# Patient Record
Sex: Male | Born: 1935
Health system: Southern US, Community
[De-identification: ages and names within clinical notes are randomized; demographics above are authoritative.]

## PROBLEM LIST (undated history)

## (undated) DIAGNOSIS — G8929 Other chronic pain: Secondary | ICD-10-CM

## (undated) DIAGNOSIS — E539 Vitamin B deficiency, unspecified: Secondary | ICD-10-CM

## (undated) DIAGNOSIS — S3992XA Unspecified injury of lower back, initial encounter: Secondary | ICD-10-CM

## (undated) DIAGNOSIS — R972 Elevated prostate specific antigen [PSA]: Secondary | ICD-10-CM

## (undated) DIAGNOSIS — M545 Low back pain, unspecified: Secondary | ICD-10-CM

## (undated) DIAGNOSIS — I10 Essential (primary) hypertension: Secondary | ICD-10-CM

## (undated) DIAGNOSIS — R5383 Other fatigue: Secondary | ICD-10-CM

## (undated) DIAGNOSIS — R5381 Other malaise: Secondary | ICD-10-CM

## (undated) DIAGNOSIS — I2510 Atherosclerotic heart disease of native coronary artery without angina pectoris: Secondary | ICD-10-CM

## (undated) DIAGNOSIS — F329 Major depressive disorder, single episode, unspecified: Secondary | ICD-10-CM

## (undated) DIAGNOSIS — K589 Irritable bowel syndrome without diarrhea: Secondary | ICD-10-CM

## (undated) DIAGNOSIS — D510 Vitamin B12 deficiency anemia due to intrinsic factor deficiency: Secondary | ICD-10-CM

## (undated) DIAGNOSIS — E785 Hyperlipidemia, unspecified: Secondary | ICD-10-CM

## (undated) DIAGNOSIS — F419 Anxiety disorder, unspecified: Secondary | ICD-10-CM

## (undated) DIAGNOSIS — N529 Male erectile dysfunction, unspecified: Secondary | ICD-10-CM

## (undated) DIAGNOSIS — F32A Depression, unspecified: Secondary | ICD-10-CM

## (undated) HISTORY — DX: Hyperlipidemia, unspecified: E78.5

## (undated) HISTORY — DX: Low back pain, unspecified: M54.50

## (undated) HISTORY — DX: Irritable bowel syndrome, unspecified: K58.9

## (undated) HISTORY — DX: Male erectile dysfunction, unspecified: N52.9

## (undated) HISTORY — DX: Anxiety disorder, unspecified: F41.9

## (undated) HISTORY — DX: Vitamin B deficiency, unspecified: E53.9

## (undated) HISTORY — DX: Other malaise: R53.81

## (undated) HISTORY — DX: Depression, unspecified: F32.A

## (undated) HISTORY — DX: Other malaise: R53.83

## (undated) HISTORY — DX: Low back pain: M54.5

## (undated) HISTORY — PX: EYE SURGERY: SHX253

## (undated) HISTORY — DX: Essential (primary) hypertension: I10

## (undated) HISTORY — DX: Other chronic pain: G89.29

## (undated) HISTORY — PX: CARPAL TUNNEL RELEASE: SHX101

## (undated) HISTORY — DX: Unspecified injury of lower back, initial encounter: S39.92XA

## (undated) HISTORY — DX: Atherosclerotic heart disease of native coronary artery without angina pectoris: I25.10

## (undated) HISTORY — DX: Major depressive disorder, single episode, unspecified: F32.9

## (undated) HISTORY — DX: Vitamin B12 deficiency anemia due to intrinsic factor deficiency: D51.0

## (undated) HISTORY — DX: Elevated prostate specific antigen (PSA): R97.20

## (undated) HISTORY — PX: VASECTOMY: SHX75

---

## 2004-06-03 LAB — HM COLONOSCOPY: HM Colonoscopy: NORMAL

## 2010-01-01 ENCOUNTER — Encounter: Payer: Self-pay | Admitting: Cardiovascular Disease

## 2010-01-02 ENCOUNTER — Encounter: Payer: Self-pay | Admitting: Cardiovascular Disease

## 2010-01-03 ENCOUNTER — Encounter: Payer: Self-pay | Admitting: Cardiovascular Disease

## 2010-01-08 ENCOUNTER — Encounter: Payer: Self-pay | Admitting: Cardiovascular Disease

## 2010-01-09 ENCOUNTER — Ambulatory Visit: Payer: Self-pay | Admitting: Cardiovascular Disease

## 2010-01-09 DIAGNOSIS — I2510 Atherosclerotic heart disease of native coronary artery without angina pectoris: Secondary | ICD-10-CM | POA: Insufficient documentation

## 2010-01-09 DIAGNOSIS — I10 Essential (primary) hypertension: Secondary | ICD-10-CM

## 2010-01-17 ENCOUNTER — Telehealth: Payer: Self-pay | Admitting: Cardiovascular Disease
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
## 2010-06-18 NOTE — Op Note (Signed)
Summary: [facility]  [facility]   Imported By: Harlon Flor 01/17/2010 15:28:00  _____________________________________________________________________  External Attachment:    Type:   Image     Comment:   External Document

## 2010-06-18 NOTE — Progress Notes (Signed)
Summary: RX  Phone Note Call from Patient Call back at [phone]   Caller: self Call For: gollan Summary of Call: PT IS CONFUSED BECAUSE 2 PRESCRIPTIONS WERE CALLED IN AND HAS QUESTIONS ABOUT THE LISINOPRIL BEING CALLED IN Initial call taken by: Harlon Flor,  January 17, 2010 2:08 PM  Follow-up for Phone Call        [facility] and confirmed that the RX for Lisinopril was still there on file-they stated that the RX was ready for pick up even though the pt refused it the first time-pt informed Follow-up by: Harlon Flor,  January 17, 2010 3:51 PM  Additional Follow-up for Phone Call Additional follow up Details #1::        attempted to call pt LMOM TCB Benedict Needy, RN  January 17, 2010 4:43 PM   attempted to pt LMOM TCB. Additional Follow-up by: Bishop Dublin, CMA,  January 22, 2010 9:22 AM    Additional Follow-up for Phone Call Additional follow up Details #2::    LMOM to call the office regarding lisinopril.   Follow-up by: Bishop Dublin, CMA,  January 23, 2010 12:57 PM   Appended Document: RX Patient called to confirm medication dose and looks like eveything is good now.

## 2010-06-18 NOTE — Letter (Signed)
Summary: [facility]  [facility]   Imported By: Harlon Flor 01/09/2010 16:28:19  _____________________________________________________________________  External Attachment:    Type:   Image     Comment:   External Document

## 2010-06-18 NOTE — Letter (Signed)
Summary: PHI  PHI   Imported By: Harlon Flor 01/17/2010 15:28:18  _____________________________________________________________________  External Attachment:    Type:   Image     Comment:   External Document

## 2010-12-10 ENCOUNTER — Encounter: Payer: Self-pay | Admitting: Cardiovascular Disease

## 2010-12-17 ENCOUNTER — Encounter: Payer: Self-pay | Admitting: Cardiovascular Disease

## 2010-12-24 ENCOUNTER — Encounter: Payer: Self-pay | Admitting: Cardiovascular Disease

## 2010-12-24 ENCOUNTER — Ambulatory Visit (INDEPENDENT_AMBULATORY_CARE_PROVIDER_SITE_OTHER): Payer: Medicare Other | Admitting: Cardiovascular Disease

## 2010-12-24 DIAGNOSIS — I10 Essential (primary) hypertension: Secondary | ICD-10-CM

## 2010-12-24 DIAGNOSIS — I2510 Atherosclerotic heart disease of native coronary artery without angina pectoris: Secondary | ICD-10-CM
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
## 2010-12-24 NOTE — Assessment & Plan Note (Signed)
Currently with no symptoms of angina. No further workup at this time. Continue current medication regimen. Previous negative stress test. Continue aggressive cholesterol management.

## 2010-12-24 NOTE — Patient Instructions (Signed)
You are doing well. No medication changes were made. Please call us if you have new issues that need to be addressed before your next appt.  We will call you for a follow up Appt. In 12 months  

## 2010-12-24 NOTE — Assessment & Plan Note (Addendum)
Blood pressure borderline elevated today. We have asked him to monitor his blood pressure at home. NO Medication changes made

## 2011-05-14 ENCOUNTER — Other Ambulatory Visit: Payer: Self-pay | Admitting: Cardiovascular Disease

## 2011-05-14 ENCOUNTER — Other Ambulatory Visit: Payer: Self-pay

## 2011-05-14 MED ORDER — LISINOPRIL 10 MG PO TABS: 10.0000 mg | ORAL_TABLET | Freq: Every day | ORAL | Status: DC

## 2011-12-10 DIAGNOSIS — F341 Dysthymic disorder: Secondary | ICD-10-CM | POA: Diagnosis not present

## 2011-12-10 DIAGNOSIS — E782 Mixed hyperlipidemia: Secondary | ICD-10-CM | POA: Diagnosis not present

## 2011-12-10 DIAGNOSIS — I10 Essential (primary) hypertension: Secondary | ICD-10-CM | POA: Diagnosis not present

## 2012-01-20 ENCOUNTER — Ambulatory Visit (INDEPENDENT_AMBULATORY_CARE_PROVIDER_SITE_OTHER): Payer: Medicare Other | Admitting: Cardiovascular Disease

## 2012-01-20 ENCOUNTER — Encounter: Payer: Self-pay | Admitting: Cardiovascular Disease

## 2012-01-20 VITALS — BP 122/82 | HR 50 | Ht 69.0 in | Wt 199.5 lb

## 2012-01-20 DIAGNOSIS — I2510 Atherosclerotic heart disease of native coronary artery without angina pectoris: Secondary | ICD-10-CM

## 2012-01-20 DIAGNOSIS — I10 Essential (primary) hypertension: Secondary | ICD-10-CM

## 2012-01-20 DIAGNOSIS — E785 Hyperlipidemia, unspecified: Secondary | ICD-10-CM

## 2012-01-20 NOTE — Assessment & Plan Note (Signed)
Currently with no symptoms of angina. No further workup at this time. Continue current medication regimen. 

## 2012-01-20 NOTE — Patient Instructions (Addendum)
You are doing well. Please hold the metoprolol in the evening, continue the morning dose  Please call us if you have new issues that need to be addressed before your next appt.  Your physician wants you to follow-up in: 12 months.  You will receive a reminder letter in the mail two months in advance. If you don't receive a letter, please call our office to schedule the follow-up appointment.

## 2012-01-20 NOTE — Assessment & Plan Note (Signed)
Heart rate is slowed today. We have suggested he hold his evening dose of metoprolol. If rate continues to be low, he could hold the metopolol altogether.

## 2012-01-20 NOTE — Assessment & Plan Note (Signed)
We have suggested he continue on his simvastatin.
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
## 2012-04-29 ENCOUNTER — Other Ambulatory Visit: Payer: Self-pay | Admitting: Cardiovascular Disease

## 2012-04-29 MED ORDER — LISINOPRIL 10 MG PO TABS: 10.0000 mg | ORAL_TABLET | Freq: Every day | ORAL | Status: DC

## 2012-04-29 NOTE — Telephone Encounter (Signed)
Refilled Lisinopril. 

## 2012-06-03 ENCOUNTER — Ambulatory Visit (INDEPENDENT_AMBULATORY_CARE_PROVIDER_SITE_OTHER): Payer: Medicare Other | Admitting: Internal Medicine

## 2012-06-03 ENCOUNTER — Encounter: Payer: Self-pay | Admitting: Internal Medicine

## 2012-06-03 VITALS — BP 142/90 | HR 70 | Temp 98.0°F | Ht 69.5 in | Wt 201.2 lb

## 2012-06-03 DIAGNOSIS — D649 Anemia, unspecified: Secondary | ICD-10-CM

## 2012-06-03 DIAGNOSIS — F341 Dysthymic disorder: Secondary | ICD-10-CM

## 2012-06-03 DIAGNOSIS — F32A Depression, unspecified: Secondary | ICD-10-CM | POA: Insufficient documentation

## 2012-06-03 DIAGNOSIS — E785 Hyperlipidemia, unspecified: Secondary | ICD-10-CM

## 2012-06-03 DIAGNOSIS — I10 Essential (primary) hypertension: Secondary | ICD-10-CM

## 2012-06-03 DIAGNOSIS — F329 Major depressive disorder, single episode, unspecified: Secondary | ICD-10-CM

## 2012-06-03 DIAGNOSIS — M545 Low back pain, unspecified: Secondary | ICD-10-CM | POA: Insufficient documentation

## 2012-06-03 DIAGNOSIS — G8929 Other chronic pain: Secondary | ICD-10-CM

## 2012-06-03 LAB — CBC WITH DIFFERENTIAL/PLATELET
Basophils Absolute: 0 10*3/uL (ref 0.0–0.1)
Eosinophils Absolute: 0.1 10*3/uL (ref 0.0–0.7)
HCT: 42.1 % (ref 39.0–52.0)
Lymphs Abs: 2.3 10*3/uL (ref 0.7–4.0)
MCHC: 33.4 g/dL (ref 30.0–36.0)
MCV: 95.7 fl (ref 78.0–100.0)
Monocytes Absolute: 0.7 10*3/uL (ref 0.1–1.0)
Platelets: 290 10*3/uL (ref 150.0–400.0)
RDW: 13.7 % (ref 11.5–14.6)

## 2012-06-03 LAB — COMPREHENSIVE METABOLIC PANEL
ALT: 24 U/L (ref 0–53)
AST: 27 U/L (ref 0–37)
Alkaline Phosphatase: 39 U/L (ref 39–117)
CO2: 25 mEq/L (ref 19–32)
GFR: 54.12 mL/min — ABNORMAL LOW (ref >60.00)
Sodium: 137 mEq/L (ref 135–145)
Total Bilirubin: 0.8 mg/dL (ref 0.3–1.2)
Total Protein: 7.8 g/dL (ref 6.0–8.3)

## 2012-06-03 LAB — LIPID PANEL: Cholesterol: 106 mg/dL (ref 0–200)

## 2012-06-03 NOTE — Assessment & Plan Note (Signed)
Symptoms well controlled with current medications. Will continue. 

## 2012-06-03 NOTE — Assessment & Plan Note (Signed)
Will check lipids and LFTs with labs today. Continue simvastatin. Follow up 6 months and prn.

## 2012-06-03 NOTE — Progress Notes (Signed)
Subjective:    Patient ID: Allen David, male    DOB: 1927-11-18, 79 y.o.   MRN: [id number]  HPI 79 year old male with history of hypertension, hyperlipidemia, anxiety/depression, and chronic lower back pain presents to establish care. He reports he is generally doing well. He reports full compliance with his medications. He denies any concerns today.  In regards to chronic low back pain, he reports symptoms first began after an injury many years ago. He intermittently has pain that radiates down the back of his left leg. He does not take medication for this. He feels that the pain is manageable without medicines.  Outpatient Encounter Prescriptions as of 06/03/2012  Medication Sig Dispense Refill  . ALPRAZolam (XANAX) 0.5 MG tablet Take 0.5 mg by mouth 4 (four) times daily.        Marland Kitchen aspirin (ASPIR-81) 81 MG EC tablet Take 162 mg by mouth daily.        . clobetasol cream (TEMOVATE) 0.05 % Apply 1 application topically 2 (two) times daily.      Marland Kitchen FLUoxetine (PROZAC) 20 MG capsule Take 40 mg by mouth daily.        Marland Kitchen lisinopril (PRINIVIL,ZESTRIL) 10 MG tablet Take 1 tablet (10 mg total) by mouth daily.  30 tablet  5  . MAGNESIUM CARBONATE PO Take by mouth daily.      . Multiple Vitamins-Minerals (CENTRUM SILVER) tablet Take 1 tablet by mouth daily.        . simvastatin (ZOCOR) 80 MG tablet Take 40 mg by mouth at bedtime.        . [DISCONTINUED] metoprolol tartrate (LOPRESSOR) 25 MG tablet Take 12.5 mg by mouth 2 (two) times daily.         BP 142/90  Pulse 70  Temp 98 F (36.7 C) (Oral)  Ht 5' 9.5" (1.765 m)  Wt 201 lb 4 oz (91.286 kg)  BMI 29.29 kg/m2  SpO2 97%  Review of Systems  Constitutional: Negative for fever, chills, activity change, appetite change, fatigue and unexpected weight change.  Eyes: Negative for visual disturbance.  Respiratory: Negative for cough and shortness of breath.   Cardiovascular: Negative for chest pain, palpitations and leg swelling.  Gastrointestinal:  Negative for abdominal pain and abdominal distention.  Genitourinary: Negative for dysuria, urgency and difficulty urinating.  Musculoskeletal: Positive for myalgias and back pain. Negative for arthralgias and gait problem.  Skin: Negative for color change and rash.  Hematological: Negative for adenopathy.  Psychiatric/Behavioral: Negative for sleep disturbance and dysphoric mood. The patient is not nervous/anxious.        Objective:   Physical Exam  Constitutional: He is oriented to person, place, and time. He appears well-developed and well-nourished. No distress.  HENT:  Head: Normocephalic and atraumatic.  Right Ear: External ear normal.  Left Ear: External ear normal.  Nose: Nose normal.  Mouth/Throat: Oropharynx is clear and moist. No oropharyngeal exudate.  Eyes: Conjunctivae normal and EOM are normal. Pupils are equal, round, and reactive to light. Right eye exhibits no discharge. Left eye exhibits no discharge. No scleral icterus.  Neck: Normal range of motion. Neck supple. No tracheal deviation present. No thyromegaly present.  Cardiovascular: Normal rate, regular rhythm and normal heart sounds.  Exam reveals no gallop and no friction rub.   No murmur heard. Pulmonary/Chest: Effort normal and breath sounds normal. No respiratory distress. He has no wheezes. He has no rales. He exhibits no tenderness.  Abdominal: Soft. Bowel sounds are normal. He exhibits no distension. There  is no tenderness.  Musculoskeletal: Normal range of motion. He exhibits no edema.  Lymphadenopathy:    He has no cervical adenopathy.  Neurological: He is alert and oriented to person, place, and time. No cranial nerve deficit. Coordination normal.  Skin: Skin is warm and dry. No rash noted. He is not diaphoretic. No erythema. No pallor.  Psychiatric: His speech is normal and behavior is normal. Judgment and thought content normal. His mood appears anxious. Cognition and memory are normal.            Assessment & Plan:

## 2012-06-03 NOTE — Assessment & Plan Note (Signed)
BP Readings from Last 3 Encounters:  06/03/12 142/90  01/20/12 122/82  12/24/10 150/78   BP slightly elevated today, however, per pt has been well controlled generally. Will continue current meds for now. Will request previous notes from [facility]. Follow up 6 months and prn.

## 2012-06-03 NOTE — Assessment & Plan Note (Signed)
Currently managed without medications. Will request records on previous evaluation. Limit prolonged walking with aggravates low back pain. Handicap tag given today.

## 2012-06-04 ENCOUNTER — Encounter: Payer: Self-pay | Admitting: Internal Medicine

## 2012-08-03 ENCOUNTER — Encounter: Payer: Self-pay | Admitting: Internal Medicine

## 2012-08-10 ENCOUNTER — Encounter: Payer: Self-pay | Admitting: Internal Medicine

## 2012-08-10 ENCOUNTER — Ambulatory Visit (INDEPENDENT_AMBULATORY_CARE_PROVIDER_SITE_OTHER): Payer: Medicare Other | Admitting: Internal Medicine

## 2012-08-10 VITALS — BP 150/98 | HR 78 | Temp 98.0°F | Wt 198.0 lb

## 2012-08-10 DIAGNOSIS — F329 Major depressive disorder, single episode, unspecified: Secondary | ICD-10-CM

## 2012-08-10 DIAGNOSIS — F341 Dysthymic disorder: Secondary | ICD-10-CM | POA: Diagnosis not present

## 2012-08-10 DIAGNOSIS — I10 Essential (primary) hypertension: Secondary | ICD-10-CM

## 2012-08-10 MED ORDER — ALPRAZOLAM 0.5 MG PO TABS: 0.5000 mg | ORAL_TABLET | Freq: Three times a day (TID) | ORAL | Status: DC | PRN

## 2012-08-10 NOTE — Assessment & Plan Note (Signed)
BP elevated today, likely related to anxiety. Will treat anxiety as above. Follow up for BP recheck 2 weeks.
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
## 2012-08-24 ENCOUNTER — Ambulatory Visit (INDEPENDENT_AMBULATORY_CARE_PROVIDER_SITE_OTHER): Payer: Medicare Other | Admitting: Internal Medicine

## 2012-08-24 ENCOUNTER — Encounter: Payer: Self-pay | Admitting: Internal Medicine

## 2012-08-24 VITALS — BP 160/90 | HR 69 | Temp 98.0°F | Wt 201.0 lb

## 2012-08-24 DIAGNOSIS — F419 Anxiety disorder, unspecified: Secondary | ICD-10-CM

## 2012-08-24 DIAGNOSIS — F341 Dysthymic disorder: Secondary | ICD-10-CM | POA: Diagnosis not present

## 2012-08-24 DIAGNOSIS — I10 Essential (primary) hypertension: Secondary | ICD-10-CM

## 2012-08-24 MED ORDER — LISINOPRIL 20 MG PO TABS: 20.0000 mg | ORAL_TABLET | Freq: Every day | ORAL | Status: DC

## 2012-08-24 MED ORDER — FLUOXETINE HCL 20 MG PO CAPS: 60.0000 mg | ORAL_CAPSULE | Freq: Every day | ORAL | Status: DC

## 2012-08-24 NOTE — Assessment & Plan Note (Signed)
BP Readings from Last 3 Encounters:  08/24/12 160/90  08/10/12 150/98  06/03/12 142/90   BP elevated today and last 2 visits. Will increase lisinopril to 20mg  daily. Recheck K and Cr in 1 week. Follow up 1 month.

## 2012-08-24 NOTE — Assessment & Plan Note (Signed)
Persistent symptoms of anxiety, however improved slightly compared to previous. Will increase dose of Fluoxetine to 60mg  daily, as this has worked well for him in the past. Will continue prn alprazolam. Will set up counseling. Discussed referral to psychiatry, and he will work on setting this up through [facility].
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
## 2012-08-24 NOTE — Patient Instructions (Signed)
We have called in an increased dose on your Lisinopril and Prozac.  Check labs next week.  Follow up 1 month.

## 2012-09-21 ENCOUNTER — Encounter: Payer: Self-pay | Admitting: Internal Medicine

## 2012-09-21 ENCOUNTER — Telehealth: Payer: Self-pay | Admitting: Internal Medicine

## 2012-09-21 ENCOUNTER — Ambulatory Visit (INDEPENDENT_AMBULATORY_CARE_PROVIDER_SITE_OTHER): Payer: Medicare Other | Admitting: Internal Medicine

## 2012-09-21 VITALS — BP 154/100 | HR 60 | Temp 97.7°F | Wt 200.0 lb

## 2012-09-21 DIAGNOSIS — F419 Anxiety disorder, unspecified: Secondary | ICD-10-CM

## 2012-09-21 DIAGNOSIS — I10 Essential (primary) hypertension: Secondary | ICD-10-CM

## 2012-09-21 DIAGNOSIS — F341 Dysthymic disorder: Secondary | ICD-10-CM

## 2012-09-21 DIAGNOSIS — F329 Major depressive disorder, single episode, unspecified: Secondary | ICD-10-CM

## 2012-09-21 LAB — BASIC METABOLIC PANEL
Chloride: 105 mEq/L (ref 96–112)
Creatinine, Ser: 1.1 mg/dL (ref 0.4–1.5)
GFR: 70.87 mL/min (ref >60.00)

## 2012-09-21 NOTE — Telephone Encounter (Signed)
Left message to call back  

## 2012-09-21 NOTE — Assessment & Plan Note (Addendum)
BP Readings from Last 3 Encounters:  09/21/12 154/100  08/24/12 160/90  08/10/12 150/98   BP continues to be slightly elevated, however generally lower at home.  Suspect BP elevated recently in part secondary to increased anxiety. K slightly elevated on labs today at 5.3. Will continue lisinopril for now. Repeat BMP on Thursday. If K persistently upper limit normal, would favor reduced dose of lisinopril and adding second agent such as Amlodipine.

## 2012-09-21 NOTE — Assessment & Plan Note (Signed)
Symptoms improved with higher dose of Fluoxetine. Will continue. Continue counseling through [facility].

## 2012-09-21 NOTE — Telephone Encounter (Signed)
Pt stated dr walker went up on his Prozac  And it is bothering his stomach in the am.  And pt wanted to know if this is one of the side effects.  Pt wanted to know if he should eat with this

## 2012-09-21 NOTE — Progress Notes (Signed)
Subjective:    Patient ID: Allen David, male    DOB: 1928-04-04, 79 y.o.   MRN: [id number]  HPI 79YO male presents to follow up hypertension and anxiety.  HTN - BP typically 140/80s at home. Notes BP higher when he is feeling anxious. Denies chest pain, palpitations, headache. Compliant with medications.  Anxiety - Symptoms improved with increased dose of Fluoxetine. Continues with intermittent use of alprazolam when feeling extremely anxious. Typically using alprazolam once per day.  Outpatient Encounter Prescriptions as of 09/21/2012  Medication Sig Dispense Refill  . ALPRAZolam (XANAX) 0.5 MG tablet Take 1-2 tablets (0.5-1 mg total) by mouth 3 (three) times daily as needed for anxiety.  120 tablet  0  . aspirin (ASPIR-81) 81 MG EC tablet Take 162 mg by mouth daily.        . clobetasol cream (TEMOVATE) 0.05 % Apply 1 application topically 2 (two) times daily.      Marland Kitchen FLUoxetine (PROZAC) 20 MG capsule Take 3 capsules (60 mg total) by mouth daily.  90 capsule  3  . lisinopril (PRINIVIL,ZESTRIL) 20 MG tablet Take 1 tablet (20 mg total) by mouth daily.  30 tablet  5  . MAGNESIUM CARBONATE PO Take by mouth daily.      . Multiple Vitamins-Minerals (CENTRUM SILVER) tablet Take 1 tablet by mouth daily.        . simvastatin (ZOCOR) 80 MG tablet Take 40 mg by mouth at bedtime.         No facility-administered encounter medications on file as of 09/21/2012.   BP 154/100  Pulse 60  Temp(Src) 97.7 F (36.5 C) (Oral)  Wt 200 lb (90.719 kg)  BMI 29.12 kg/m2  SpO2 97%  Review of Systems  Constitutional: Negative for fever, chills, activity change, appetite change, fatigue and unexpected weight change.  Eyes: Negative for visual disturbance.  Respiratory: Negative for cough and shortness of breath.   Cardiovascular: Negative for chest pain, palpitations and leg swelling.  Gastrointestinal: Negative for abdominal pain and abdominal distention.  Genitourinary: Negative for dysuria, urgency and  difficulty urinating.  Musculoskeletal: Negative for arthralgias and gait problem.  Skin: Negative for color change and rash.  Hematological: Negative for adenopathy.  Psychiatric/Behavioral: Negative for sleep disturbance and dysphoric mood. The patient is nervous/anxious.        Objective:   Physical Exam  Constitutional: He is oriented to person, place, and time. He appears well-developed and well-nourished. No distress.  HENT:  Head: Normocephalic and atraumatic.  Right Ear: External ear normal.  Left Ear: External ear normal.  Nose: Nose normal.  Mouth/Throat: Oropharynx is clear and moist. No oropharyngeal exudate.  Eyes: Conjunctivae and EOM are normal. Pupils are equal, round, and reactive to light. Right eye exhibits no discharge. Left eye exhibits no discharge. No scleral icterus.  Neck: Normal range of motion. Neck supple. No tracheal deviation present. No thyromegaly present.  Cardiovascular: Normal rate, regular rhythm and normal heart sounds.  Exam reveals no gallop and no friction rub.   No murmur heard. Pulmonary/Chest: Effort normal and breath sounds normal. No respiratory distress. He has no wheezes. He has no rales. He exhibits no tenderness.  Musculoskeletal: Normal range of motion. He exhibits no edema.  Lymphadenopathy:    He has no cervical adenopathy.  Neurological: He is alert and oriented to person, place, and time. No cranial nerve deficit. Coordination normal.  Skin: Skin is warm and dry. No rash noted. He is not diaphoretic. No erythema. No pallor.  Psychiatric:  His speech is normal and behavior is normal. Judgment and thought content normal. His mood appears anxious.          Assessment & Plan:

## 2012-09-21 NOTE — Telephone Encounter (Signed)
Yes, it may be helpful to take the medication with food.

## 2012-09-23 ENCOUNTER — Other Ambulatory Visit (INDEPENDENT_AMBULATORY_CARE_PROVIDER_SITE_OTHER): Payer: Medicare Other

## 2012-09-23 ENCOUNTER — Other Ambulatory Visit: Payer: Self-pay | Admitting: *Deleted

## 2012-09-23 DIAGNOSIS — I10 Essential (primary) hypertension: Secondary | ICD-10-CM | POA: Diagnosis not present

## 2012-09-23 LAB — BASIC METABOLIC PANEL
Calcium: 9.2 mg/dL (ref 8.4–10.5)
GFR: 68.63 mL/min (ref >60.00)
Glucose, Bld: 95 mg/dL (ref 70–99)
Potassium: 4.8 mEq/L (ref 3.5–5.1)
Sodium: 137 mEq/L (ref 135–145)

## 2012-09-23 NOTE — Telephone Encounter (Signed)
Patient informed and verbalized understanding

## 2012-10-22 ENCOUNTER — Encounter: Payer: Self-pay | Admitting: Internal Medicine

## 2012-10-22 ENCOUNTER — Ambulatory Visit (INDEPENDENT_AMBULATORY_CARE_PROVIDER_SITE_OTHER): Payer: Medicare Other | Admitting: Internal Medicine

## 2012-10-22 VITALS — BP 120/80 | HR 88 | Temp 97.9°F | Wt 194.0 lb

## 2012-10-22 DIAGNOSIS — I10 Essential (primary) hypertension: Secondary | ICD-10-CM | POA: Diagnosis not present

## 2012-10-22 DIAGNOSIS — F341 Dysthymic disorder: Secondary | ICD-10-CM | POA: Diagnosis not present

## 2012-10-22 DIAGNOSIS — F329 Major depressive disorder, single episode, unspecified: Secondary | ICD-10-CM

## 2012-10-22 MED ORDER — LISINOPRIL 10 MG PO TABS: 10.0000 mg | ORAL_TABLET | Freq: Every day | ORAL | Status: DC

## 2012-10-22 NOTE — Progress Notes (Signed)
Subjective:    Patient ID: Allen David, male    DOB: [birth year], 79 y.o.   MRN: [id number]  HPI 79 year old male with history of anxiety/depression, hypertension, hyperlipidemia presents for followup. Recently, blood pressure has been elevated in dose of lisinopril was increased to 20 mg daily. He reports over the last few weeks his blood pressure has been much lower, typically 110/80. At times, he is symptomatic with lightheadedness. He also notes fatigue. He denies any chest pain, palpitations, headache.  He reports symptoms of anxiety and depression are better controlled with current medications. He continues to struggle with ongoing tensions with his family members.  Outpatient Encounter Prescriptions as of 10/22/2012  Medication Sig Dispense Refill  . Acetaminophen (TYLENOL ARTHRITIS PAIN PO) Take by mouth.      . ALPRAZolam (XANAX) 0.5 MG tablet Take 1-2 tablets (0.5-1 mg total) by mouth 3 (three) times daily as needed for anxiety.  120 tablet  0  . aspirin (ASPIR-81) 81 MG EC tablet Take 162 mg by mouth daily.        . clobetasol cream (TEMOVATE) 0.05 % Apply 1 application topically 2 (two) times daily.      Marland Kitchen FLUoxetine (PROZAC) 20 MG capsule Take 3 capsules (60 mg total) by mouth daily.  90 capsule  3  . folic acid (FOLVITE) 400 MCG tablet Take 400 mcg by mouth daily.      Marland Kitchen glucosamine-chondroitin 500-400 MG tablet Take 1 tablet by mouth 3 (three) times daily.      Marland Kitchen lisinopril (PRINIVIL,ZESTRIL) 10 MG tablet Take 1 tablet (10 mg total) by mouth daily.  30 tablet  5  . Multiple Vitamin (ONE-A-DAY MENS PO) Take by mouth.      . simvastatin (ZOCOR) 80 MG tablet Take 40 mg by mouth at bedtime.        . [DISCONTINUED] lisinopril (PRINIVIL,ZESTRIL) 20 MG tablet Take 1 tablet (20 mg total) by mouth daily.  30 tablet  5  . MAGNESIUM CARBONATE PO Take by mouth daily.      . Multiple Vitamins-Minerals (CENTRUM SILVER) tablet Take 1 tablet by mouth daily.         No facility-administered  encounter medications on file as of 10/22/2012.   BP 120/80  Pulse 88  Temp(Src) 97.9 F (36.6 C) (Oral)  Wt 194 lb (87.998 kg)  BMI 28.25 kg/m2  SpO2 96%  Review of Systems  Constitutional: Positive for fatigue. Negative for fever, chills, activity change, appetite change and unexpected weight change.  Eyes: Negative for visual disturbance.  Respiratory: Negative for cough and shortness of breath.   Cardiovascular: Negative for chest pain, palpitations and leg swelling.  Gastrointestinal: Negative for abdominal pain and abdominal distention.  Genitourinary: Negative for dysuria, urgency and difficulty urinating.  Musculoskeletal: Negative for arthralgias and gait problem.  Skin: Negative for color change and rash.  Neurological: Positive for light-headedness.  Hematological: Negative for adenopathy.  Psychiatric/Behavioral: Positive for dysphoric mood. Negative for suicidal ideas and sleep disturbance. The patient is nervous/anxious.        Objective:   Physical Exam  Constitutional: He is oriented to person, place, and time. He appears well-developed and well-nourished. No distress.  HENT:  Head: Normocephalic and atraumatic.  Right Ear: External ear normal.  Left Ear: External ear normal.  Nose: Nose normal.  Mouth/Throat: Oropharynx is clear and moist. No oropharyngeal exudate.  Eyes: Conjunctivae and EOM are normal. Pupils are equal, round, and reactive to light. Right eye exhibits no discharge. Left eye  exhibits no discharge. No scleral icterus.  Neck: Normal range of motion. Neck supple. No tracheal deviation present. No thyromegaly present.  Cardiovascular: Normal rate, regular rhythm and normal heart sounds.  Exam reveals no gallop and no friction rub.   No murmur heard. Pulmonary/Chest: Effort normal and breath sounds normal. No respiratory distress. He has no wheezes. He has no rales. He exhibits no tenderness.  Musculoskeletal: Normal range of motion. He exhibits no  edema.  Lymphadenopathy:    He has no cervical adenopathy.  Neurological: He is alert and oriented to person, place, and time. No cranial nerve deficit. Coordination normal.  Skin: Skin is warm and dry. No rash noted. He is not diaphoretic. No erythema. No pallor.  Psychiatric: He has a normal mood and affect. His behavior is normal. Judgment and thought content normal.          Assessment & Plan:

## 2012-10-22 NOTE — Assessment & Plan Note (Signed)
Symptoms currently relatively well controlled with Fluoxetine. Encouraged pt to pursue activities he enjoys such as camping and fishing. We have set up psychology referral for counseling, however he declines. Followup 3 months and prn.

## 2012-10-22 NOTE — Assessment & Plan Note (Signed)
BP Readings from Last 3 Encounters:  10/22/12 120/80  09/21/12 154/100  08/24/12 160/90   BP improved today, however has been lower at home with some symptoms of fatigue. Will decrease dose of Lisinopril back to 10mg  daily. Recheck BP at nurse visit in 2 weeks.

## 2012-10-22 NOTE — Patient Instructions (Signed)
Please decrease dose of blood pressure medication, lisinopril, to 10mg  daily. Monitor blood pressure and follow up for recheck of blood pressure in 2 weeks.

## 2012-11-11 ENCOUNTER — Encounter: Payer: Medicare Other | Admitting: *Deleted

## 2012-12-01 ENCOUNTER — Encounter: Payer: Medicare Other | Admitting: Internal Medicine

## 2012-12-08 ENCOUNTER — Ambulatory Visit (INDEPENDENT_AMBULATORY_CARE_PROVIDER_SITE_OTHER): Payer: Medicare Other | Admitting: Internal Medicine

## 2012-12-08 ENCOUNTER — Encounter: Payer: Self-pay | Admitting: Internal Medicine

## 2012-12-08 VITALS — BP 150/90 | HR 70 | Temp 98.2°F | Ht 70.0 in | Wt 203.0 lb

## 2012-12-08 DIAGNOSIS — R5381 Other malaise: Secondary | ICD-10-CM | POA: Diagnosis not present

## 2012-12-08 DIAGNOSIS — Z0000 Encounter for general adult medical examination without abnormal findings: Secondary | ICD-10-CM | POA: Diagnosis not present

## 2012-12-08 DIAGNOSIS — R5383 Other fatigue: Secondary | ICD-10-CM | POA: Insufficient documentation

## 2012-12-08 DIAGNOSIS — I10 Essential (primary) hypertension: Secondary | ICD-10-CM

## 2012-12-08 MED ORDER — LISINOPRIL 20 MG PO TABS: 20.0000 mg | ORAL_TABLET | Freq: Every day | ORAL | Status: DC

## 2012-12-08 NOTE — Assessment & Plan Note (Signed)
BP Readings from Last 3 Encounters:  12/08/12 150/90  10/22/12 120/80  09/21/12 154/100   BP slightly elevated on current medication. Will increase Lisinopril to 20mg  daily and check BMP with labs in 1 week.

## 2012-12-08 NOTE — Progress Notes (Signed)
Subjective:    Patient ID: Allen David, male    DOB: [birth year], 79 y.o.   MRN: [id number]  HPI The patient is here for annual Medicare wellness examination and management of other chronic and acute problems.   The risk factors are reflected in the social history.  The roster of all physicians providing medical care to patient - is listed in the Snapshot section of the chart.  Activities of daily living:  The patient is 100% independent in all ADLs: dressing, toileting, feeding as well as independent mobility. Daughter helps out with household cleaning, etc.  Home safety : The patient has smoke detectors in the home. They wear seatbelts.  There are no firearms at home. There is no violence in the home.   There is no risks for hepatitis, STDs or HIV. There is no history of blood transfusion. They have no travel history to infectious disease endemic areas of the world.  The patient has not seen their dentist in the last six month. 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 They have seen their eye doctor in the last year. Followed by [facility] optometrist.   No hearing aids.They have deferred audiologic testing in the last year.   They do not  have excessive sun exposure. Discussed the need for sun protection: hats, long sleeves and use of sunscreen if there is significant sun exposure. Dermatologist - Dr. Gwen Pounds.  Diet: the importance of a healthy diet is discussed. They do have a healthy diet.  The benefits of regular aerobic exercise were discussed. He exercises by walking on treadmill, using bike.  Depression screen: there are no signs or vegative symptoms of depression- irritability, change in appetite, anhedonia, sadness/tearfullness. Symptoms have been well controlled on medication.  Cognitive assessment: the patient manages all their financial and personal affairs and is actively engaged. They could relate day,date,year and events.   The following portions of the patient's  history were reviewed and updated as appropriate: allergies, current medications, past family history, past medical history,  past surgical history, past social history  and problem list.  Visual acuity was not assessed per patient preference since she has regular follow up with her ophthalmologist. Hearing and body mass index were assessed and reviewed.   During the course of the visit the patient was educated and counseled about appropriate screening and preventive services including : fall prevention , diabetes screening, nutrition counseling, colorectal cancer screening, and recommended immunizations.    Outpatient Encounter Prescriptions as of 12/08/2012  Medication Sig Dispense Refill  . Acetaminophen (TYLENOL ARTHRITIS PAIN PO) Take by mouth.      . ALPRAZolam (XANAX) 1 MG tablet Take 1 mg by mouth 4 (four) times daily as needed for sleep.      Marland Kitchen aspirin (ASPIR-81) 81 MG EC tablet Take 162 mg by mouth daily.        . Calcium Carbonate-Vitamin D (CALCIUM-VITAMIN D) 500-200 MG-UNIT per tablet Take 1 tablet by mouth 2 (two) times daily with a meal.      . clobetasol cream (TEMOVATE) 0.05 % Apply 1 application topically 2 (two) times daily.      . Cyanocobalamin (B-12) 2500 MCG TABS Take by mouth daily.      Marland Kitchen FLUoxetine (PROZAC) 20 MG capsule Take 3 capsules (60 mg total) by mouth daily.  90 capsule  3  . folic acid (FOLVITE) 400 MCG tablet Take 400 mcg by mouth daily.      Marland Kitchen glucosamine-chondroitin 500-400 MG tablet Take 1  tablet by mouth 3 (three) times daily.      Marland Kitchen lisinopril (PRINIVIL,ZESTRIL) 10 MG tablet Take 1 tablet (10 mg total) by mouth daily.  30 tablet  5  . MAGNESIUM CARBONATE PO Take by mouth daily.      . Multiple Vitamins-Minerals (CENTRUM SILVER) tablet Take 1 tablet by mouth daily.        Marland Kitchen pyridoxine (B-6) 100 MG tablet Take 100 mg by mouth daily.      . simvastatin (ZOCOR) 80 MG tablet Take 40 mg by mouth at bedtime.        . Multiple Vitamin (ONE-A-DAY MENS PO) Take by  mouth.      . [DISCONTINUED] ALPRAZolam (XANAX) 0.5 MG tablet Take 1-2 tablets (0.5-1 mg total) by mouth 3 (three) times daily as needed for anxiety.  120 tablet  0   No facility-administered encounter medications on file as of 12/08/2012.     Review of Systems  Constitutional: Positive for fatigue. Negative for fever, chills, activity change, appetite change and unexpected weight change.  Eyes: Negative for visual disturbance.  Respiratory: Negative for cough and shortness of breath.   Cardiovascular: Negative for chest pain, palpitations and leg swelling.  Gastrointestinal: Negative for abdominal pain and abdominal distention.  Genitourinary: Negative for dysuria, urgency and difficulty urinating.  Musculoskeletal: Negative for arthralgias and gait problem.  Skin: Negative for color change and rash.  Hematological: Negative for adenopathy.  Psychiatric/Behavioral: Negative for sleep disturbance and dysphoric mood. The patient is not nervous/anxious.        Objective:   Physical Exam  Constitutional: He is oriented to person, place, and time. He appears well-developed and well-nourished. No distress.  HENT:  Head: Normocephalic and atraumatic.  Right Ear: External ear normal.  Left Ear: External ear normal.  Nose: Nose normal.  Mouth/Throat: Oropharynx is clear and moist. No oropharyngeal exudate.  Eyes: Conjunctivae and EOM are normal. Pupils are equal, round, and reactive to light. Right eye exhibits no discharge. Left eye exhibits no discharge. No scleral icterus.  Neck: Normal range of motion. Neck supple. No tracheal deviation present. No thyromegaly present.  Cardiovascular: Normal rate, regular rhythm and normal heart sounds.  Exam reveals no gallop and no friction rub.   No murmur heard. Pulmonary/Chest: Effort normal and breath sounds normal. No respiratory distress. He has no wheezes. He has no rales. He exhibits no tenderness.  Abdominal: Soft. Bowel sounds are normal.  He exhibits no distension and no mass. There is no tenderness. There is no rebound and no guarding.  Musculoskeletal: Normal range of motion. He exhibits no edema.  Lymphadenopathy:    He has no cervical adenopathy.  Neurological: He is alert and oriented to person, place, and time. No cranial nerve deficit. Coordination normal.  Skin: Skin is warm and dry. No rash noted. He is not diaphoretic. No erythema. No pallor.  Psychiatric: He has a normal mood and affect. His behavior is normal. Judgment and thought content normal.          Assessment & Plan:

## 2012-12-08 NOTE — Patient Instructions (Signed)
Labs in 1 week.  Follow up in 1 month.

## 2012-12-08 NOTE — Assessment & Plan Note (Addendum)
Mild generalized fatigue noted. No focal symptoms. Suspect related to B12 deficiency. Will check B12, TSH, CBC, CMP with labs.

## 2012-12-08 NOTE — Assessment & Plan Note (Signed)
General medical exam normal today. Appropriate screening performed. Health maintenance is UTD. Given some symptoms of fatigue, and h/o B12 deficiency, will check B12, TSH and CBC with labs today. Encouraged continued efforts at healthy diet and regular physical activity. Follow up in 3 months and sooner as needed.

## 2012-12-13 ENCOUNTER — Telehealth: Payer: Self-pay | Admitting: *Deleted

## 2012-12-13 NOTE — Telephone Encounter (Signed)
Pt is coming in for labs tomorrow 07.29.2014 what labs and dx?  

## 2012-12-13 NOTE — Telephone Encounter (Signed)
Lab orders are in

## 2012-12-14 ENCOUNTER — Other Ambulatory Visit (INDEPENDENT_AMBULATORY_CARE_PROVIDER_SITE_OTHER): Payer: Medicare Other

## 2012-12-14 DIAGNOSIS — R5383 Other fatigue: Secondary | ICD-10-CM

## 2012-12-14 DIAGNOSIS — I10 Essential (primary) hypertension: Secondary | ICD-10-CM

## 2012-12-14 DIAGNOSIS — Z0000 Encounter for general adult medical examination without abnormal findings: Secondary | ICD-10-CM

## 2012-12-14 LAB — COMPREHENSIVE METABOLIC PANEL
Albumin: 4.1 g/dL (ref 3.5–5.2)
Alkaline Phosphatase: 40 U/L (ref 39–117)
BUN: 21 mg/dL (ref 6–23)
CO2: 25 mEq/L (ref 19–32)
Calcium: 9.4 mg/dL (ref 8.4–10.5)
Chloride: 105 mEq/L (ref 96–112)
Glucose, Bld: 92 mg/dL (ref 70–99)
Potassium: 4.7 mEq/L (ref 3.5–5.1)
Sodium: 138 mEq/L (ref 135–145)
Total Protein: 7 g/dL (ref 6.0–8.3)

## 2012-12-14 LAB — PSA, MEDICARE: PSA: 3.35 ng/ml (ref 0.10–4.00)

## 2012-12-14 LAB — TSH: TSH: 2.77 u[IU]/mL (ref 0.35–5.50)

## 2012-12-15 ENCOUNTER — Ambulatory Visit (INDEPENDENT_AMBULATORY_CARE_PROVIDER_SITE_OTHER): Payer: Medicare Other | Admitting: *Deleted

## 2012-12-15 DIAGNOSIS — E538 Deficiency of other specified B group vitamins: Secondary | ICD-10-CM

## 2012-12-15 MED ORDER — CYANOCOBALAMIN 1000 MCG/ML IJ SOLN
1000.0000 ug | Freq: Once | INTRAMUSCULAR | Status: AC
  Administered 2012-12-15: 1000 ug via INTRAMUSCULAR

## 2012-12-22 ENCOUNTER — Ambulatory Visit (INDEPENDENT_AMBULATORY_CARE_PROVIDER_SITE_OTHER): Payer: Medicare Other | Admitting: *Deleted

## 2012-12-22 DIAGNOSIS — E538 Deficiency of other specified B group vitamins: Secondary | ICD-10-CM

## 2012-12-22 MED ORDER — CYANOCOBALAMIN 1000 MCG/ML IJ SOLN
1000.0000 ug | Freq: Once | INTRAMUSCULAR | Status: AC
  Administered 2012-12-22: 1000 ug via INTRAMUSCULAR

## 2012-12-29 ENCOUNTER — Ambulatory Visit (INDEPENDENT_AMBULATORY_CARE_PROVIDER_SITE_OTHER): Payer: Medicare Other | Admitting: *Deleted

## 2012-12-29 DIAGNOSIS — E538 Deficiency of other specified B group vitamins: Secondary | ICD-10-CM | POA: Diagnosis not present

## 2012-12-29 MED ORDER — CYANOCOBALAMIN 1000 MCG/ML IJ SOLN
1000.0000 ug | Freq: Once | INTRAMUSCULAR | Status: AC
  Administered 2012-12-29: 1000 ug via INTRAMUSCULAR

## 2012-12-31 DIAGNOSIS — Z79899 Other long term (current) drug therapy: Secondary | ICD-10-CM | POA: Diagnosis not present

## 2013-01-10 ENCOUNTER — Encounter: Payer: Self-pay | Admitting: Internal Medicine

## 2013-01-24 ENCOUNTER — Ambulatory Visit: Payer: Medicare Other | Admitting: Internal Medicine

## 2013-01-25 ENCOUNTER — Encounter: Payer: Self-pay | Admitting: Adult Health

## 2013-01-25 ENCOUNTER — Ambulatory Visit (INDEPENDENT_AMBULATORY_CARE_PROVIDER_SITE_OTHER): Payer: Medicare Other | Admitting: Adult Health

## 2013-01-25 VITALS — BP 160/76 | HR 60 | Temp 97.5°F | Resp 12 | Ht 70.0 in | Wt 210.5 lb

## 2013-01-25 DIAGNOSIS — Z79899 Other long term (current) drug therapy: Secondary | ICD-10-CM

## 2013-01-25 DIAGNOSIS — F329 Major depressive disorder, single episode, unspecified: Secondary | ICD-10-CM

## 2013-01-25 DIAGNOSIS — R5381 Other malaise: Secondary | ICD-10-CM | POA: Diagnosis not present

## 2013-01-25 DIAGNOSIS — I10 Essential (primary) hypertension: Secondary | ICD-10-CM

## 2013-01-25 DIAGNOSIS — F341 Dysthymic disorder: Secondary | ICD-10-CM | POA: Diagnosis not present

## 2013-01-25 LAB — BASIC METABOLIC PANEL
BUN: 19 mg/dL (ref 6–23)
CO2: 29 mEq/L (ref 19–32)
Chloride: 105 mEq/L (ref 96–112)
Glucose, Bld: 77 mg/dL (ref 70–99)
Potassium: 5.3 mEq/L — ABNORMAL HIGH (ref 3.5–5.1)

## 2013-01-25 MED ORDER — LISINOPRIL 10 MG PO TABS: 10.0000 mg | ORAL_TABLET | Freq: Every day | ORAL | Status: DC

## 2013-01-25 NOTE — Patient Instructions (Addendum)
  Your blood pressure is a little elevated.  Start taking lisinopril 30 mg daily. You will take a 20 mg tablet and a 10 mg tablet.  Please have your blood drawn prior to leaving the office today.  Continue with the B12 injections. If you bring in your medication we can use it.

## 2013-01-25 NOTE — Assessment & Plan Note (Signed)
Symptoms have improved since starting B12 injections. He has B12 that was sent to him from the [place] but they did not send any needles. I instructed the patient to bring his medication with him and we will be able to utilize it to administer his B12 injections.

## 2013-01-25 NOTE — Progress Notes (Signed)
Subjective:    Patient ID: Allen David, male    DOB: 02-18-1928, 79 y.o.   MRN: [id number]  HPI  Patient presents for follow up HTN, depression and B12 deficiency. He is feeling well. Currently taking Lisinopril 20 mg daily. He is getting his B12 injections. He feels slightly improved from feeling fatigued. Reports that the [place] sent him B12 but did not provide him with any needles. As far as his depression he tried taking Prozac 40 mg daily; however, he feels the increase in medication is causing him to have heart burn. He reports he is going back to the 20 mg daily.   Current Outpatient Prescriptions on File Prior to Visit  Medication Sig Dispense Refill  . Acetaminophen (TYLENOL ARTHRITIS PAIN PO) Take by mouth.      . ALPRAZolam (XANAX) 1 MG tablet Take 1 mg by mouth 4 (four) times daily as needed for sleep.      Marland Kitchen aspirin (ASPIR-81) 81 MG EC tablet Take 162 mg by mouth daily.        . Calcium Carbonate-Vitamin D (CALCIUM-VITAMIN D) 500-200 MG-UNIT per tablet Take 1 tablet by mouth 2 (two) times daily with a meal.      . clobetasol cream (TEMOVATE) 0.05 % Apply 1 application topically 2 (two) times daily.      Marland Kitchen FLUoxetine (PROZAC) 20 MG capsule Take 3 capsules (60 mg total) by mouth daily.  90 capsule  3  . folic acid (FOLVITE) 400 MCG tablet Take 400 mcg by mouth daily.      Marland Kitchen glucosamine-chondroitin 500-400 MG tablet Take 1 tablet by mouth 3 (three) times daily.      Marland Kitchen lisinopril (PRINIVIL,ZESTRIL) 20 MG tablet Take 1 tablet (20 mg total) by mouth daily.  30 tablet  5  . MAGNESIUM CARBONATE PO Take by mouth daily.      . Multiple Vitamin (ONE-A-DAY MENS PO) Take by mouth.      . pyridoxine (B-6) 100 MG tablet Take 100 mg by mouth daily.      . simvastatin (ZOCOR) 80 MG tablet Take 40 mg by mouth at bedtime.         No current facility-administered medications on file prior to visit.    Review of Systems  Constitutional: Negative.        Fatigue improved  Respiratory: Negative.    Cardiovascular: Negative.   Gastrointestinal: Negative.   Genitourinary: Negative.   Neurological: Negative.   Psychiatric/Behavioral: Negative for confusion. The patient is not nervous/anxious.      BP 160/76  Pulse 60  Temp(Src) 97.5 F (36.4 C) (Oral)  Resp 12  Ht 5\' 10"  (1.778 m)  Wt 210 lb 8 oz (95.482 kg)  BMI 30.2 kg/m2  SpO2 98%     Objective:   Physical Exam  Constitutional: He is oriented to person, place, and time.  Pleasant 79 year old male in no apparent distress presents for followup  HENT:  Head: Normocephalic and atraumatic.  Cardiovascular: Normal rate, regular rhythm, normal heart sounds and intact distal pulses.  Exam reveals no gallop and no friction rub.   No murmur heard. Pulmonary/Chest: Effort normal and breath sounds normal. No respiratory distress. He has no wheezes. He has no rales.  Abdominal: Soft. Bowel sounds are normal.  Musculoskeletal: He exhibits no edema and no tenderness.  Lymphadenopathy:    He has no cervical adenopathy.  Neurological: He is alert and oriented to person, place, and time. No cranial nerve deficit. Coordination normal.  Skin:  Skin is warm and dry.  Psychiatric: He has a normal mood and affect. His behavior is normal. Judgment and thought content normal.          Assessment & Plan:

## 2013-01-25 NOTE — Assessment & Plan Note (Signed)
Blood pressure is slightly elevated at 160/70. Will increase lisinopril to 30 mg. Patient will take a 20 mg tablet and a 10 mg tablet. Instructed to monitor blood pressure for one week with this new change. Check metabolic panel

## 2013-01-25 NOTE — Assessment & Plan Note (Signed)
Patient reports being on Prozac 40 mg with recent increase. He reports that anything over 20 mg produces acid reflux. He is going to reduce his Prozac dose back to 20 mg daily. He will report if anxiety or depression worsen.

## 2013-01-27 ENCOUNTER — Other Ambulatory Visit (INDEPENDENT_AMBULATORY_CARE_PROVIDER_SITE_OTHER): Payer: Medicare Other

## 2013-01-27 DIAGNOSIS — E875 Hyperkalemia: Secondary | ICD-10-CM | POA: Diagnosis not present

## 2013-01-28 LAB — BASIC METABOLIC PANEL
BUN: 17 mg/dL (ref 6–23)
CO2: 26 mEq/L (ref 19–32)
Calcium: 9.3 mg/dL (ref 8.4–10.5)
Creatinine, Ser: 1.1 mg/dL (ref 0.4–1.5)
Glucose, Bld: 88 mg/dL (ref 70–99)
Sodium: 139 mEq/L (ref 135–145)

## 2013-02-01 ENCOUNTER — Ambulatory Visit (INDEPENDENT_AMBULATORY_CARE_PROVIDER_SITE_OTHER): Payer: Medicare Other | Admitting: *Deleted

## 2013-02-01 DIAGNOSIS — E538 Deficiency of other specified B group vitamins: Secondary | ICD-10-CM

## 2013-02-01 MED ORDER — CYANOCOBALAMIN 1000 MCG/ML IJ SOLN
1000.0000 ug | Freq: Once | INTRAMUSCULAR | Status: AC
  Administered 2013-02-01: 1000 ug via INTRAMUSCULAR

## 2013-02-10 ENCOUNTER — Encounter: Payer: Self-pay | Admitting: Cardiovascular Disease

## 2013-02-10 ENCOUNTER — Ambulatory Visit (INDEPENDENT_AMBULATORY_CARE_PROVIDER_SITE_OTHER): Payer: Medicare Other | Admitting: Cardiovascular Disease

## 2013-02-10 VITALS — BP 148/92 | HR 57 | Ht 69.0 in | Wt 212.5 lb

## 2013-02-10 DIAGNOSIS — I10 Essential (primary) hypertension: Secondary | ICD-10-CM | POA: Diagnosis not present

## 2013-02-10 DIAGNOSIS — I2510 Atherosclerotic heart disease of native coronary artery without angina pectoris: Secondary | ICD-10-CM

## 2013-02-10 DIAGNOSIS — E785 Hyperlipidemia, unspecified: Secondary | ICD-10-CM | POA: Diagnosis not present

## 2013-02-10 MED ORDER — LISINOPRIL 40 MG PO TABS: 40.0000 mg | ORAL_TABLET | Freq: Every day | ORAL | Status: DC

## 2013-02-10 MED ORDER — CLOBETASOL PROPIONATE 0.05 % EX CREA: 1.0000 "application " | TOPICAL_CREAM | Freq: Two times a day (BID) | CUTANEOUS | Status: DC

## 2013-02-10 NOTE — Assessment & Plan Note (Signed)
We will write him a prescription for lisinopril 40 mg daily. He is confused by 2 prescriptions of 10 and 20 for a total of 30.

## 2013-02-10 NOTE — Assessment & Plan Note (Signed)
Cholesterol is at goal on the current lipid regimen. No changes to the medications were made.  

## 2013-02-10 NOTE — Patient Instructions (Addendum)
You are doing well. Please increase the lisinopril to 40 mg daily  Please call us if you have new issues that need to be addressed before your next appt.  Your physician wants you to follow-up in: 12 months.  You will receive a reminder letter in the mail two months in advance. If you don't receive a letter, please call our office to schedule the follow-up appointment.

## 2013-02-10 NOTE — Assessment & Plan Note (Signed)
Currently with no symptoms of angina. No further workup at this time. Continue current medication regimen. 
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
## 2013-03-24 ENCOUNTER — Other Ambulatory Visit: Payer: Self-pay

## 2013-04-28 ENCOUNTER — Ambulatory Visit (INDEPENDENT_AMBULATORY_CARE_PROVIDER_SITE_OTHER): Payer: Medicare Other | Admitting: Internal Medicine

## 2013-04-28 ENCOUNTER — Encounter: Payer: Self-pay | Admitting: Internal Medicine

## 2013-04-28 VITALS — BP 140/90 | HR 78 | Temp 97.7°F | Wt 205.0 lb

## 2013-04-28 DIAGNOSIS — I10 Essential (primary) hypertension: Secondary | ICD-10-CM | POA: Diagnosis not present

## 2013-04-28 DIAGNOSIS — J069 Acute upper respiratory infection, unspecified: Secondary | ICD-10-CM | POA: Diagnosis not present

## 2013-04-28 DIAGNOSIS — E538 Deficiency of other specified B group vitamins: Secondary | ICD-10-CM | POA: Diagnosis not present

## 2013-04-28 DIAGNOSIS — F341 Dysthymic disorder: Secondary | ICD-10-CM

## 2013-04-28 DIAGNOSIS — E785 Hyperlipidemia, unspecified: Secondary | ICD-10-CM

## 2013-04-28 DIAGNOSIS — F329 Major depressive disorder, single episode, unspecified: Secondary | ICD-10-CM

## 2013-04-28 LAB — COMPREHENSIVE METABOLIC PANEL
AST: 22 U/L (ref 0–37)
Albumin: 4.4 g/dL (ref 3.5–5.2)
BUN: 26 mg/dL — ABNORMAL HIGH (ref 6–23)
CO2: 26 mEq/L (ref 19–32)
Calcium: 9.2 mg/dL (ref 8.4–10.5)
Chloride: 105 mEq/L (ref 96–112)
Creatinine, Ser: 1.1 mg/dL (ref 0.4–1.5)
GFR: 69.26 mL/min (ref >60.00)
Glucose, Bld: 81 mg/dL (ref 70–99)
Potassium: 5.1 mEq/L (ref 3.5–5.1)
Total Bilirubin: 0.7 mg/dL (ref 0.3–1.2)

## 2013-04-28 MED ORDER — CYANOCOBALAMIN 1000 MCG/ML IJ SOLN
1000.0000 ug | Freq: Once | INTRAMUSCULAR | Status: AC
  Administered 2013-04-28: 1000 ug via INTRAMUSCULAR

## 2013-04-28 NOTE — Progress Notes (Signed)
Pre-visit discussion using our clinic review tool. No additional management support is needed unless otherwise documented below in the visit note.  

## 2013-04-28 NOTE — Progress Notes (Signed)
Subjective:    Patient ID: Allen David, male    DOB: [birth year], 79 y.o.   MRN: [id number]  HPI 79 year old male with history of anxiety, hypertension, hyperlipidemia presents for followup. He reports that over the last couple of weeks he has had clear nasal drainage, postnasal drip, with dry cough. He denies any fever or chills. He reports that his daughters have been ill with similar symptoms. He reports that symptoms have gradually improved. He is not currently taking any medication for this.  In regards to anxiety, he reports that symptoms are well-controlled with use of fluoxetine and alprazolam. He continues to follow at the [facility]. He denies any concerns today.  Outpatient Encounter Prescriptions as of 04/28/2013  Medication Sig  . Acetaminophen (TYLENOL ARTHRITIS PAIN PO) Take by mouth as needed.   . ALPRAZolam (XANAX) 1 MG tablet Take 1 mg by mouth 4 (four) times daily as needed for sleep.  Marland Kitchen aspirin (ASPIR-81) 81 MG EC tablet Take 162 mg by mouth daily.    Marland Kitchen CALCIUM CARBONATE-VITAMIN D PO Take 600 mg by mouth daily.  . clobetasol cream (TEMOVATE) 0.05 % Apply 1 application topically 2 (two) times daily.  . cyanocobalamin (,VITAMIN B-12,) 1000 MCG/ML injection Inject 1,000 mcg into the muscle every 30 (thirty) days.  . fish oil-omega-3 fatty acids 1000 MG capsule Take 1 g by mouth daily.  Marland Kitchen FLUoxetine (PROZAC) 20 MG capsule Take 3 capsules (60 mg total) by mouth daily.  . folic acid (FOLVITE) 400 MCG tablet Take 400 mcg by mouth daily.  Marland Kitchen glucosamine-chondroitin 500-400 MG tablet Take 1 tablet by mouth daily.   Marland Kitchen lisinopril (PRINIVIL,ZESTRIL) 40 MG tablet Take 1 tablet (40 mg total) by mouth daily.  Marland Kitchen MAGNESIUM CARBONATE PO Take 500 mg by mouth daily.   . Multiple Vitamin (ONE-A-DAY MENS PO) Take by mouth.  . simvastatin (ZOCOR) 80 MG tablet Take 40 mg by mouth at bedtime.    . [DISCONTINUED] pyridoxine (B-6) 100 MG tablet Take 100 mg by mouth daily.  . [EXPIRED]  cyanocobalamin ((VITAMIN B-12)) injection 1,000 mcg    BP 140/90  Pulse 78  Temp(Src) 97.7 F (36.5 C) (Oral)  Wt 205 lb (92.987 kg)  SpO2 97%  Review of Systems  Constitutional: Negative for fever, chills, activity change, appetite change, fatigue and unexpected weight change.  HENT: Positive for congestion, postnasal drip and rhinorrhea.   Eyes: Negative for visual disturbance.  Respiratory: Positive for cough. Negative for shortness of breath.   Cardiovascular: Negative for chest pain, palpitations and leg swelling.  Gastrointestinal: Negative for abdominal pain and abdominal distention.  Genitourinary: Negative for dysuria, urgency and difficulty urinating.  Musculoskeletal: Negative for arthralgias and gait problem.  Skin: Negative for color change and rash.  Hematological: Negative for adenopathy.  Psychiatric/Behavioral: Negative for sleep disturbance and dysphoric mood. The patient is not nervous/anxious.        Objective:   Physical Exam  Constitutional: He is oriented to person, place, and time. He appears well-developed and well-nourished. No distress.  HENT:  Head: Normocephalic and atraumatic.  Right Ear: External ear normal.  Left Ear: External ear normal.  Nose: Nose normal.  Mouth/Throat: Oropharynx is clear and moist. No oropharyngeal exudate.  Eyes: Conjunctivae and EOM are normal. Pupils are equal, round, and reactive to light. Right eye exhibits no discharge. Left eye exhibits no discharge. No scleral icterus.  Neck: Normal range of motion. Neck supple. No tracheal deviation present. No thyromegaly present.  Cardiovascular: Normal  rate, regular rhythm and normal heart sounds.  Exam reveals no gallop and no friction rub.   No murmur heard. Pulmonary/Chest: Effort normal and breath sounds normal. No respiratory distress. He has no wheezes. He has no rales. He exhibits no tenderness.  Musculoskeletal: Normal range of motion. He exhibits no edema.    Lymphadenopathy:    He has no cervical adenopathy.  Neurological: He is alert and oriented to person, place, and time. No cranial nerve deficit. Coordination normal.  Skin: Skin is warm and dry. No rash noted. He is not diaphoretic. No erythema. No pallor.  Psychiatric: He has a normal mood and affect. His speech is normal and behavior is normal. Judgment and thought content normal.          Assessment & Plan:

## 2013-04-28 NOTE — Assessment & Plan Note (Signed)
Will check LFTs with labs today. Continue Simvastatin. 

## 2013-04-28 NOTE — Assessment & Plan Note (Signed)
Symptoms are consistent with viral upper respiratory infection. Encouraged rest, adequate fluid intake and use of over-the-counter medications including Mucinex and Claritin as needed. Patient will call if symptoms do not continue to improve.

## 2013-04-28 NOTE — Assessment & Plan Note (Signed)
BP Readings from Last 3 Encounters:  04/28/13 140/90  02/10/13 148/92  01/25/13 160/76   Blood pressure slightly elevated today, however is better controlled at home per report. Will continue Lisinopril.

## 2013-04-28 NOTE — Assessment & Plan Note (Signed)
Symptoms well controlled with Fluoxetine and prn alprazolam. Will continue.

## 2013-10-03 ENCOUNTER — Other Ambulatory Visit: Payer: Self-pay

## 2013-10-03 DIAGNOSIS — I10 Essential (primary) hypertension: Secondary | ICD-10-CM

## 2013-10-03 MED ORDER — LISINOPRIL 40 MG PO TABS: 40.0000 mg | ORAL_TABLET | Freq: Every day | ORAL | Status: DC

## 2013-12-20 ENCOUNTER — Ambulatory Visit (INDEPENDENT_AMBULATORY_CARE_PROVIDER_SITE_OTHER): Payer: Medicare Other | Admitting: Internal Medicine

## 2013-12-20 ENCOUNTER — Encounter: Payer: Self-pay | Admitting: Internal Medicine

## 2013-12-20 VITALS — BP 112/78 | HR 69 | Temp 97.7°F | Ht 70.0 in | Wt 199.5 lb

## 2013-12-20 DIAGNOSIS — D510 Vitamin B12 deficiency anemia due to intrinsic factor deficiency: Secondary | ICD-10-CM | POA: Diagnosis not present

## 2013-12-20 DIAGNOSIS — Z0000 Encounter for general adult medical examination without abnormal findings: Secondary | ICD-10-CM | POA: Diagnosis not present

## 2013-12-20 DIAGNOSIS — R972 Elevated prostate specific antigen [PSA]: Secondary | ICD-10-CM

## 2013-12-20 DIAGNOSIS — E785 Hyperlipidemia, unspecified: Secondary | ICD-10-CM

## 2013-12-20 DIAGNOSIS — I10 Essential (primary) hypertension: Secondary | ICD-10-CM

## 2013-12-20 LAB — COMPREHENSIVE METABOLIC PANEL
ALT: 17 U/L (ref 0–53)
AST: 25 U/L (ref 0–37)
Albumin: 3.9 g/dL (ref 3.5–5.2)
Alkaline Phosphatase: 39 U/L (ref 39–117)
BILIRUBIN TOTAL: 0.9 mg/dL (ref 0.2–1.2)
BUN: 27 mg/dL — ABNORMAL HIGH (ref 6–23)
CO2: 23 meq/L (ref 19–32)
CREATININE: 1.2 mg/dL (ref 0.4–1.5)
Calcium: 9.2 mg/dL (ref 8.4–10.5)
Chloride: 106 mEq/L (ref 96–112)
GFR: 60.65 mL/min (ref >60.00)
Glucose, Bld: 70 mg/dL (ref 70–99)
Potassium: 4.2 mEq/L (ref 3.5–5.1)
SODIUM: 138 meq/L (ref 135–145)
TOTAL PROTEIN: 6.8 g/dL (ref 6.0–8.3)

## 2013-12-20 LAB — LIPID PANEL
CHOLESTEROL: 105 mg/dL (ref 0–200)
HDL: 31.4 mg/dL — ABNORMAL LOW (ref >39.00)
LDL Cholesterol: 56 mg/dL (ref 0–99)
NonHDL: 73.6
Total CHOL/HDL Ratio: 3
Triglycerides: 87 mg/dL (ref 0.0–149.0)
VLDL: 17.4 mg/dL (ref 0.0–40.0)

## 2013-12-20 LAB — VITAMIN B12: Vitamin B-12: 480 pg/mL (ref 211–911)

## 2013-12-20 LAB — PSA, MEDICARE: PSA: 4.46 ng/mL — AB (ref 0.10–4.00)

## 2013-12-20 NOTE — Progress Notes (Signed)
Pre visit review using our clinic review tool, if applicable. No additional management support is needed unless otherwise documented below in the visit note. 

## 2013-12-20 NOTE — Assessment & Plan Note (Signed)
Will check CBC and B12 with labs today.

## 2013-12-20 NOTE — Assessment & Plan Note (Signed)
Will check lipids and LFTs with labs. 

## 2013-12-20 NOTE — Assessment & Plan Note (Signed)
BP Readings from Last 3 Encounters:  12/20/13 112/78  04/28/13 140/90  02/10/13 148/92   BP generally well controlled on current medications. Will check renal function with labs.

## 2013-12-20 NOTE — Assessment & Plan Note (Signed)
General medical exam normal today. Prevnar given today. Labs today including CBC, CMP, Lipids, PSA. Discussed benefits and limitations of PSA testing.

## 2013-12-20 NOTE — Progress Notes (Signed)
Subjective:    Patient ID: Allen David, male    DOB: 10/04/1927, 80 y.o.   MRN: [id number]  HPI The patient is here for annual Medicare wellness examination and management of other chronic and acute problems.   The risk factors are reflected in the social history.  The roster of all physicians providing medical care to patient - is listed in the Snapshot section of the chart.  Activities of daily living:  The patient is 100% independent in all ADLs: dressing, toileting, feeding as well as independent mobility. Daughter helps out with household cleaning, etc.  Home safety : The patient has smoke detectors in the home. They wear seatbelts.  There are no firearms at home. There is no violence in the home.   There is no risks for hepatitis, STDs or HIV. There is no history of blood transfusion. They have no travel history to infectious disease endemic areas of the world.  The patient has not seen their dentist in the last six month. Working on Occupational hygienist care through the [place]. They have seen their eye doctor in the last year. Followed by [facility] optometrist.   No hearing aids.They have deferred audiologic testing in the last year.   They do not  have excessive sun exposure. Discussed the need for sun protection: hats, long sleeves and use of sunscreen if there is significant sun exposure. Dermatologist - Dr. Nehemiah Massed.  Diet: the importance of a healthy diet is discussed. They do have a healthy diet.  The benefits of regular aerobic exercise were discussed. He exercises by walking on treadmill, using bike.  Depression screen: there are no signs or vegative symptoms of depression- irritability, change in appetite, anhedonia, sadness/tearfullness. Symptoms have been well controlled on medication.  Cognitive assessment: the patient manages all their financial and personal affairs and is actively engaged. They could relate day,date,year and events.   The following portions of the  patient's history were reviewed and updated as appropriate: allergies, current medications, past family history, past medical history,  past surgical history, past social history  and problem list.  Visual acuity was not assessed per patient preference since she has regular follow up with her ophthalmologist. Hearing and body mass index were assessed and reviewed.   During the course of the visit the patient was educated and counseled about appropriate screening and preventive services including : fall prevention , diabetes screening, nutrition counseling, colorectal cancer screening, and recommended immunizations.     Review of Systems  Constitutional: Negative for fever, chills, activity change, appetite change, fatigue and unexpected weight change.  Eyes: Negative for visual disturbance.  Respiratory: Negative for cough and shortness of breath.   Cardiovascular: Negative for chest pain, palpitations and leg swelling.  Gastrointestinal: Negative for abdominal pain and abdominal distention.  Genitourinary: Negative for dysuria, urgency and difficulty urinating.  Musculoskeletal: Negative for arthralgias and gait problem.  Skin: Negative for color change and rash.  Hematological: Negative for adenopathy.  Psychiatric/Behavioral: Negative for sleep disturbance and dysphoric mood. The patient is not nervous/anxious.        Objective:    BP 112/78  Pulse 69  Temp(Src) 97.7 F (36.5 C) (Oral)  Ht 5\' 10"  (1.778 m)  Wt 199 lb 8 oz (90.493 kg)  BMI 28.63 kg/m2  SpO2 96% Physical Exam  Constitutional: He is oriented to person, place, and time. He appears well-developed and well-nourished. No distress.  HENT:  Head: Normocephalic and atraumatic.  Right Ear: External ear normal.  Left  Ear: External ear normal.  Nose: Nose normal.  Mouth/Throat: Oropharynx is clear and moist. No oropharyngeal exudate.  Eyes: Conjunctivae and EOM are normal. Pupils are equal, round, and reactive to light.  Right eye exhibits no discharge. Left eye exhibits no discharge. No scleral icterus.  Neck: Normal range of motion. Neck supple. No tracheal deviation present. No thyromegaly present.  Cardiovascular: Normal rate, regular rhythm and normal heart sounds.  Exam reveals no gallop and no friction rub.   No murmur heard. Pulmonary/Chest: Effort normal and breath sounds normal. No accessory muscle usage. Not tachypneic. No respiratory distress. He has no decreased breath sounds. He has no wheezes. He has no rhonchi. He has no rales. He exhibits no tenderness.  Musculoskeletal: Normal range of motion. He exhibits no edema.  Lymphadenopathy:    He has no cervical adenopathy.  Neurological: He is alert and oriented to person, place, and time. No cranial nerve deficit. Coordination normal.  Skin: Skin is warm and dry. No rash noted. He is not diaphoretic. No erythema. No pallor.  Psychiatric: He has a normal mood and affect. His behavior is normal. Judgment and thought content normal.          Assessment & Plan:   Problem List Items Addressed This Visit     Unprioritized   Hyperlipidemia     Will check lipids and LFTs with labs.    Relevant Orders      Lipid panel (Completed)   Hypertension      BP Readings from Last 3 Encounters:  12/20/13 112/78  04/28/13 140/90  02/10/13 148/92   BP generally well controlled on current medications. Will check renal function with labs.    Relevant Orders      Comprehensive metabolic panel (Completed)      Microalbumin / creatinine urine ratio   Medicare annual wellness visit, subsequent - Primary     General medical exam normal today. Prevnar given today. Labs today including CBC, CMP, Lipids, PSA. Discussed benefits and limitations of PSA testing.    Relevant Orders      PSA, Medicare (Completed)   Pernicious anemia     Will check CBC and B12 with labs today.    Relevant Orders      B12 (Completed)       Return in about 6 months (around  06/22/2014) for Recheck.

## 2013-12-20 NOTE — Patient Instructions (Signed)

## 2013-12-22 LAB — MICROALBUMIN / CREATININE URINE RATIO
Creatinine,U: 115 mg/dL
MICROALB/CREAT RATIO: 2.9 mg/g (ref 0.0–30.0)
Microalb, Ur: 3.3 mg/dL — ABNORMAL HIGH (ref 0.0–1.9)

## 2013-12-22 NOTE — Addendum Note (Signed)
Addended by: Ronette Deter A on: 12/22/2013 10:47 AM   Modules accepted: Orders

## 2014-01-17 DIAGNOSIS — N529 Male erectile dysfunction, unspecified: Secondary | ICD-10-CM | POA: Diagnosis not present

## 2014-01-17 DIAGNOSIS — R972 Elevated prostate specific antigen [PSA]: Secondary | ICD-10-CM | POA: Diagnosis not present

## 2014-04-20 ENCOUNTER — Telehealth: Payer: Self-pay | Admitting: Internal Medicine

## 2014-04-20 NOTE — Telephone Encounter (Signed)
Pt request appt for feeling sluggish and no energy. Please advise where to add to schedule or if it is okay to schedule with D[facility]/msn

## 2014-04-20 NOTE — Telephone Encounter (Signed)
Fine to schedule with Doss

## 2014-04-20 NOTE — Telephone Encounter (Signed)
Please advise 

## 2014-04-21 NOTE — Telephone Encounter (Signed)
Per Dr Gilford Rile okay to schedule with Flonnie Overman

## 2014-05-02 ENCOUNTER — Ambulatory Visit (INDEPENDENT_AMBULATORY_CARE_PROVIDER_SITE_OTHER): Payer: Medicare Other | Admitting: Nurse Practitioner

## 2014-05-02 ENCOUNTER — Encounter: Payer: Self-pay | Admitting: Nurse Practitioner

## 2014-05-02 ENCOUNTER — Encounter (INDEPENDENT_AMBULATORY_CARE_PROVIDER_SITE_OTHER): Payer: Self-pay

## 2014-05-02 VITALS — BP 158/70 | HR 88 | Temp 97.3°F | Resp 14 | Ht 70.0 in | Wt 197.4 lb

## 2014-05-02 DIAGNOSIS — R450 Nervousness: Secondary | ICD-10-CM | POA: Diagnosis not present

## 2014-05-02 DIAGNOSIS — R5383 Other fatigue: Secondary | ICD-10-CM

## 2014-05-02 DIAGNOSIS — R531 Weakness: Secondary | ICD-10-CM

## 2014-05-02 NOTE — Progress Notes (Signed)
Subjective:    Patient ID: Allen David, male    DOB: 1928/04/17, 80 y.o.   MRN: [id number]  HPI  Allen David is an 80 yo male with a CC of feeling sluggish and moody.   1) Pt has felt like this for 2-3 weeks. Mood swings, doesn't feel like self, wants to lay in bed, appetite dropped, feels jittery and anxious, Denies palpitations.   Labs were performed in August and were normal except for elevated BUN and Decreased HDL  TSH was 12/14/12 and 2.77   250 mg of B12 Orally OTC brand.    Review of Systems  Constitutional: Positive for appetite change and fatigue. Negative for fever, chills and diaphoresis.  HENT: Positive for facial swelling.   Eyes: Negative for visual disturbance.  Respiratory: Negative for chest tightness, shortness of breath and wheezing.   Cardiovascular: Negative for chest pain, palpitations and leg swelling.  Gastrointestinal: Negative for nausea, vomiting and diarrhea.  Endocrine: Negative for cold intolerance, heat intolerance, polydipsia, polyphagia and polyuria.  Musculoskeletal: Negative for myalgias.  Skin: Negative for rash.  Neurological: Negative for dizziness, tremors, syncope, weakness, numbness and headaches.  Psychiatric/Behavioral: Negative for confusion. The patient is nervous/anxious.    Past Medical History  Diagnosis Date  . HTN (hypertension)   . HLD (hyperlipidemia)   . Depression   . IBS (irritable bowel syndrome)   . Back injury     with chronic pain    History   Social History  . Marital Status: Single    Spouse Name: N/A    Number of Children: N/A  . Years of Education: N/A   Occupational History  . Retired    Social History Main Topics  . Smoking status: Former Smoker    Quit date: 05/19/1966  . Smokeless tobacco: Not on file     Comment: Quit in 1968  . Alcohol Use: 0.0 oz/week    1-2 Cans of beer per week     Comment: daily  . Drug Use: No  . Sexual Activity: Not on file   Other Topics Concern  . Not on  file   Social History Narrative   Lives in [place] alone. Children, 4 girls.      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      Regularly exercise- rides bike and mows lawn. Divorced.    Past Surgical History  Procedure Laterality Date  . Vasectomy      Family History  Problem Relation Age of Onset  . Stroke Mother   . Hypertension Mother   . Ulcers Mother   . Cancer Father     multiple myeloma  . Diabetes Sister   . Diabetes Brother   . Diabetes Brother     Allergies  Allergen Reactions  . Tetanus Toxoids Swelling    Current Outpatient Prescriptions on File Prior to Visit  Medication Sig Dispense Refill  . Acetaminophen (TYLENOL ARTHRITIS PAIN PO) Take by mouth as needed.     . ALPRAZolam (XANAX) 1 MG tablet Take 1 mg by mouth 4 (four) times daily as needed for sleep.    Marland Kitchen aspirin (ASPIR-81) 81 MG EC tablet Take 162 mg by mouth daily.      Marland Kitchen CALCIUM CARBONATE-VITAMIN D PO Take 600 mg by mouth daily.    . clobetasol cream (TEMOVATE) 2.69 % Apply 1 application topically 2 (two) times daily. 30 g 6  . cyanocobalamin (,VITAMIN  B-12,) 1000 MCG/ML injection Inject 1,000 mcg into the muscle every 30 (thirty) days.    . fish oil-omega-3 fatty acids 1000 MG capsule Take 1 g by mouth daily.    Marland Kitchen FLUoxetine (PROZAC) 20 MG capsule Take 3 capsules (60 mg total) by mouth daily. 90 capsule 3  . folic acid (FOLVITE) 964 MCG tablet Take 400 mcg by mouth daily.    Marland Kitchen glucosamine-chondroitin 500-400 MG tablet Take 1 tablet by mouth daily.     Marland Kitchen lisinopril (PRINIVIL,ZESTRIL) 40 MG tablet Take 1 tablet (40 mg total) by mouth daily. 30 tablet 6  . MAGNESIUM CARBONATE PO Take 500 mg by mouth daily.     . Multiple Vitamin (ONE-A-DAY MENS PO) Take by mouth.    . simvastatin (ZOCOR) 80 MG tablet Take 40 mg by mouth at bedtime.       No current facility-administered medications on file prior to visit.        Objective:   Physical Exam    Constitutional: He is oriented to person, place, and time. He appears well-developed and well-nourished. No distress.  HENT:  Head: Normocephalic and atraumatic.  Neck: Normal range of motion. Neck supple. No thyromegaly present.  Cardiovascular: Normal rate and regular rhythm.   Pulmonary/Chest: Effort normal and breath sounds normal.  Lymphadenopathy:    He has no cervical adenopathy.  Neurological: He is alert and oriented to person, place, and time.  Skin: Skin is warm and dry. No rash noted. He is not diaphoretic.  Psychiatric: He has a normal mood and affect. His behavior is normal. Judgment and thought content normal.      BP 158/70 mmHg  Pulse 88  Temp(Src) 97.3 F (36.3 C)  Resp 14  Ht '5\' 10"'  (1.778 m)  Wt 197 lb 6.4 oz (89.54 kg)  BMI 28.32 kg/m2  SpO2 94%       Assessment & Plan:

## 2014-05-02 NOTE — Progress Notes (Signed)
Pre visit review using our clinic review tool, if applicable. No additional management support is needed unless otherwise documented below in the visit note. 

## 2014-05-02 NOTE — Patient Instructions (Signed)
We have met your maximum for Vitamin B12 tests this year (4 per year)   Your thyroid is being tested with lab draws today. Please visit lab before leaving.   We will follow up at the beginning of the year. Please call us if you feel worse or fail to improve.  

## 2014-05-03 DIAGNOSIS — R5383 Other fatigue: Secondary | ICD-10-CM | POA: Insufficient documentation

## 2014-05-03 LAB — T4, FREE: FREE T4: 0.73 ng/dL (ref 0.60–1.60)

## 2014-05-03 LAB — TSH: TSH: 3.19 u[IU]/mL (ref 0.35–4.50)

## 2014-05-03 NOTE — Assessment & Plan Note (Signed)
Pt has very generalized symptoms. His repeat thyroid panel was normal. Pt informed and verbalized understanding. Will FU 05/31/13

## 2014-05-31 ENCOUNTER — Ambulatory Visit: Payer: Medicare Other | Admitting: Nurse Practitioner

## 2014-06-23 ENCOUNTER — Encounter: Payer: Self-pay | Admitting: Internal Medicine

## 2014-06-23 ENCOUNTER — Encounter: Payer: Self-pay | Admitting: *Deleted

## 2014-06-23 ENCOUNTER — Ambulatory Visit (INDEPENDENT_AMBULATORY_CARE_PROVIDER_SITE_OTHER): Payer: Medicare Other | Admitting: Internal Medicine

## 2014-06-23 VITALS — BP 153/83 | HR 57 | Temp 97.4°F | Ht 70.0 in | Wt 198.5 lb

## 2014-06-23 DIAGNOSIS — F418 Other specified anxiety disorders: Secondary | ICD-10-CM

## 2014-06-23 DIAGNOSIS — F32A Depression, unspecified: Secondary | ICD-10-CM

## 2014-06-23 DIAGNOSIS — F329 Major depressive disorder, single episode, unspecified: Secondary | ICD-10-CM

## 2014-06-23 DIAGNOSIS — I10 Essential (primary) hypertension: Secondary | ICD-10-CM

## 2014-06-23 DIAGNOSIS — F419 Anxiety disorder, unspecified: Principal | ICD-10-CM

## 2014-06-23 LAB — COMPREHENSIVE METABOLIC PANEL
ALT: 16 U/L (ref 0–53)
AST: 17 U/L (ref 0–37)
Albumin: 4 g/dL (ref 3.5–5.2)
Alkaline Phosphatase: 37 U/L — ABNORMAL LOW (ref 39–117)
BUN: 26 mg/dL — AB (ref 6–23)
CALCIUM: 9.4 mg/dL (ref 8.4–10.5)
CHLORIDE: 106 meq/L (ref 96–112)
CO2: 28 meq/L (ref 19–32)
Creatinine, Ser: 1.1 mg/dL (ref 0.40–1.50)
GFR: 67.62 mL/min (ref >60.00)
GLUCOSE: 80 mg/dL (ref 70–99)
Potassium: 4.5 mEq/L (ref 3.5–5.1)
Sodium: 138 mEq/L (ref 135–145)
Total Bilirubin: 0.7 mg/dL (ref 0.2–1.2)
Total Protein: 6.7 g/dL (ref 6.0–8.3)

## 2014-06-23 MED ORDER — ESCITALOPRAM OXALATE 10 MG PO TABS: 10.0000 mg | ORAL_TABLET | Freq: Every day | ORAL | Status: DC

## 2014-06-23 NOTE — Assessment & Plan Note (Signed)
Worsening symptoms of anxiety. Stop Fluoxetine. Start Lexapro 10mg  daily. Continue Alprazolam as needed. We will set up psychiatry evaluation. Follow up here in 4 weeks.

## 2014-06-23 NOTE — Patient Instructions (Addendum)
Stop Fluoxetine. Start Lexapro 10mg  daily. Continue Alprazolam as needed for severe anxiety.  We will set up an evaluation with psychiatry to help with medication adjustments for anxiety.  Follow up in 4 weeks.

## 2014-06-23 NOTE — Assessment & Plan Note (Signed)
BP Readings from Last 3 Encounters:  06/23/14 153/83  05/02/14 158/70  12/20/13 112/78   BP elevated today, however pt is anxious and agitated. Will continue Lisinopril and check renal function with labs. Follow up in 4 weeks for repeat BP. Consider adding beta blocker if BP continues to be elevated.

## 2014-06-23 NOTE — Progress Notes (Signed)
Pre visit review using our clinic review tool, if applicable. No additional management support is needed unless otherwise documented below in the visit note. 

## 2014-06-23 NOTE — Progress Notes (Signed)
Subjective:    Patient ID: Allen David, male    DOB: [birth year], 81 y.o.   MRN: [id number]  HPI 81YO male presents for follow up.  Continues to feel jittery and anxious. Recently had thyroid function checked, as he was concerned this was contributing. Thyroid function was normal. He reduced dose of Fluoxetine down to 40mg  daily at night. Would like to try a different medication. Taking Alprazolam 1mg  up to 4 times per day because of severe anxiety. Notes some stress at work and at home. He has taken Zoloft and Abilify in the past with no improvement. No longer seeing psychiatry.   Past medical, surgical, family and social history per today's encounter.  Review of Systems  Constitutional: Negative for fever, chills, activity change, appetite change, fatigue and unexpected weight change.  Eyes: Negative for visual disturbance.  Respiratory: Negative for cough and shortness of breath.   Cardiovascular: Negative for chest pain, palpitations and leg swelling.  Gastrointestinal: Negative for abdominal pain and abdominal distention.  Genitourinary: Negative for dysuria, urgency and difficulty urinating.  Musculoskeletal: Negative for arthralgias and gait problem.  Skin: Negative for color change and rash.  Hematological: Negative for adenopathy.  Psychiatric/Behavioral: Positive for agitation. Negative for suicidal ideas, sleep disturbance and dysphoric mood. The patient is nervous/anxious.        Objective:    BP 153/83 mmHg  Pulse 57  Temp(Src) 97.4 F (36.3 C) (Oral)  Ht 5\' 10"  (1.778 m)  Wt 198 lb 8 oz (90.039 kg)  BMI 28.48 kg/m2  SpO2 96% Physical Exam  Constitutional: He is oriented to person, place, and time. He appears well-developed and well-nourished. No distress.  HENT:  Head: Normocephalic and atraumatic.  Right Ear: External ear normal.  Left Ear: External ear normal.  Nose: Nose normal.  Mouth/Throat: Oropharynx is clear and moist. No oropharyngeal  exudate.  Eyes: Conjunctivae and EOM are normal. Pupils are equal, round, and reactive to light. Right eye exhibits no discharge. Left eye exhibits no discharge. No scleral icterus.  Neck: Normal range of motion. Neck supple. No tracheal deviation present. No thyromegaly present.  Cardiovascular: Normal rate, regular rhythm and normal heart sounds.  Exam reveals no gallop and no friction rub.   No murmur heard. Pulmonary/Chest: Effort normal and breath sounds normal. No accessory muscle usage. No tachypnea. No respiratory distress. He has no decreased breath sounds. He has no wheezes. He has no rhonchi. He has no rales. He exhibits no tenderness.  Musculoskeletal: Normal range of motion. He exhibits no edema.  Lymphadenopathy:    He has no cervical adenopathy.  Neurological: He is alert and oriented to person, place, and time. No cranial nerve deficit. Coordination normal.  Skin: Skin is warm and dry. No rash noted. He is not diaphoretic. No erythema. No pallor.  Psychiatric: Judgment and thought content normal. His mood appears anxious. His speech is tangential. He is agitated. Cognition and memory are normal.          Assessment & Plan:   Problem List Items Addressed This Visit      Unprioritized   Anxiety and depression - Primary    Worsening symptoms of anxiety. Stop Fluoxetine. Start Lexapro 10mg  daily. Continue Alprazolam as needed. We will set up psychiatry evaluation. Follow up here in 4 weeks.      Relevant Medications   escitalopram (LEXAPRO) tablet   Other Relevant Orders   Ambulatory referral to Psychiatry   Hypertension    BP Readings from Last 3  Encounters:  06/23/14 153/83  05/02/14 158/70  12/20/13 112/78   BP elevated today, however pt is anxious and agitated. Will continue Lisinopril and check renal function with labs. Follow up in 4 weeks for repeat BP. Consider adding beta blocker if BP continues to be elevated.      Relevant Orders   Comprehensive  metabolic panel       Return in about 4 weeks (around 07/21/2014) for Recheck.

## 2014-06-27 ENCOUNTER — Telehealth: Payer: Self-pay | Admitting: Internal Medicine

## 2014-06-27 ENCOUNTER — Other Ambulatory Visit: Payer: Self-pay | Admitting: *Deleted

## 2014-06-27 DIAGNOSIS — F419 Anxiety disorder, unspecified: Principal | ICD-10-CM

## 2014-06-27 DIAGNOSIS — F329 Major depressive disorder, single episode, unspecified: Secondary | ICD-10-CM

## 2014-06-27 MED ORDER — ESCITALOPRAM OXALATE 10 MG PO TABS: 10.0000 mg | ORAL_TABLET | Freq: Every day | ORAL | Status: DC

## 2014-06-27 NOTE — Telephone Encounter (Signed)
emmi emailed °

## 2014-07-05 ENCOUNTER — Telehealth: Payer: Self-pay | Admitting: Internal Medicine

## 2014-07-05 NOTE — Telephone Encounter (Addendum)
Pt took rx for Lexapro to the [facility] to have his MD there fill the rx. Pt was told that his primary care md would need to provide supporting documentation for the Lexapro (example why the patient is on the this medication). Pt has an appt with the [facility] on this Friday at 8am.Please advise pt/msn

## 2014-07-06 NOTE — Telephone Encounter (Signed)
Either. 1. He can fill the generic Lexapro at a local pharmacy. Or 2. He can ask his doctor at the [place] to prescribe something on their formulary

## 2014-07-06 NOTE — Telephone Encounter (Signed)
Spoke with pt, advised of MDs message.  He states he will talk to [facility] about it tomorrow.

## 2014-07-12 ENCOUNTER — Other Ambulatory Visit: Payer: Self-pay | Admitting: Cardiovascular Disease

## 2014-07-18 DIAGNOSIS — I10 Essential (primary) hypertension: Secondary | ICD-10-CM | POA: Diagnosis not present

## 2014-07-18 DIAGNOSIS — N529 Male erectile dysfunction, unspecified: Secondary | ICD-10-CM | POA: Diagnosis not present

## 2014-07-18 DIAGNOSIS — R972 Elevated prostate specific antigen [PSA]: Secondary | ICD-10-CM | POA: Diagnosis not present

## 2014-07-27 ENCOUNTER — Encounter: Payer: Self-pay | Admitting: Internal Medicine

## 2014-07-27 ENCOUNTER — Ambulatory Visit (INDEPENDENT_AMBULATORY_CARE_PROVIDER_SITE_OTHER): Payer: Medicare Other | Admitting: Internal Medicine

## 2014-07-27 VITALS — BP 160/78 | HR 57 | Resp 14 | Ht 70.0 in | Wt 205.5 lb

## 2014-07-27 DIAGNOSIS — F329 Major depressive disorder, single episode, unspecified: Secondary | ICD-10-CM

## 2014-07-27 DIAGNOSIS — I10 Essential (primary) hypertension: Secondary | ICD-10-CM

## 2014-07-27 DIAGNOSIS — F418 Other specified anxiety disorders: Secondary | ICD-10-CM | POA: Diagnosis not present

## 2014-07-27 DIAGNOSIS — F419 Anxiety disorder, unspecified: Principal | ICD-10-CM

## 2014-07-27 MED ORDER — ESCITALOPRAM OXALATE 20 MG PO TABS: 20.0000 mg | ORAL_TABLET | Freq: Every day | ORAL | Status: DC

## 2014-07-27 NOTE — Progress Notes (Signed)
Pre visit review using our clinic review tool, if applicable. No additional management support is needed unless otherwise documented below in the visit note. 

## 2014-07-27 NOTE — Assessment & Plan Note (Signed)
Symptoms improved with Lexapro, however still some residual anxiety symptoms. Will increase Lexapro to 20mg  daily. Follow up in 4 weeks.

## 2014-07-27 NOTE — Assessment & Plan Note (Signed)
BP Readings from Last 3 Encounters:  07/27/14 160/78  06/23/14 153/83  05/02/14 158/70   BP slightly elevated, however okay for his age. Will continue Lisinopril. Follow up with cardiology as scheduled and here in 4 weeks.

## 2014-07-27 NOTE — Patient Instructions (Addendum)
Increase Lexapro to 20mg  daily.  Limit use of Alprazolam (Xanax) as much as possible.  Follow up in 4 weeks.

## 2014-07-27 NOTE — Progress Notes (Signed)
Subjective:    Patient ID: Allen David, male    DOB: 24-Dec-1927, 81 y.o.   MRN: [id number]  HPI  81YO male presents for follow up.  Last seen 2/5 for anxiety/depression. Started on Lexapro.   Feels that anxiety and depression much better controlled on Lexapro. However, would like to increase dose if possible. Continues to use Xanax as needed, trying to limit use.  HTN - BP at home typically near 324M systolic. Compliant with medication. Has follow up with Cardiology pending.  Recently seen by Urology. Exam was normal.  Past medical, surgical, family and social history per today's encounter.  Review of Systems  Constitutional: Negative for fever, chills, activity change, appetite change, fatigue and unexpected weight change.  Eyes: Negative for visual disturbance.  Respiratory: Negative for cough and shortness of breath.   Cardiovascular: Negative for chest pain, palpitations and leg swelling.  Gastrointestinal: Negative for abdominal pain, diarrhea, constipation and abdominal distention.  Genitourinary: Negative for dysuria, urgency and difficulty urinating.  Musculoskeletal: Negative for arthralgias and gait problem.  Skin: Negative for color change and rash.  Hematological: Negative for adenopathy.  Psychiatric/Behavioral: Positive for dysphoric mood. Negative for sleep disturbance. The patient is nervous/anxious.        Objective:    BP 160/78 mmHg  Pulse 57  Resp 14  Ht 5\' 10"  (1.778 m)  Wt 205 lb 8 oz (93.214 kg)  BMI 29.49 kg/m2  SpO2 98% Physical Exam  Constitutional: He is oriented to person, place, and time. He appears well-developed and well-nourished. No distress.  HENT:  Head: Normocephalic and atraumatic.  Right Ear: External ear normal.  Left Ear: External ear normal.  Nose: Nose normal.  Mouth/Throat: Oropharynx is clear and moist. No oropharyngeal exudate.  Eyes: Conjunctivae and EOM are normal. Pupils are equal, round, and reactive to light.  Right eye exhibits no discharge. Left eye exhibits no discharge. No scleral icterus.  Neck: Normal range of motion. Neck supple. No tracheal deviation present. No thyromegaly present.  Cardiovascular: Normal rate, regular rhythm and normal heart sounds.  Exam reveals no gallop and no friction rub.   No murmur heard. Pulmonary/Chest: Effort normal and breath sounds normal. No accessory muscle usage. No tachypnea. No respiratory distress. He has no decreased breath sounds. He has no wheezes. He has no rhonchi. He has no rales. He exhibits no tenderness.  Musculoskeletal: Normal range of motion. He exhibits no edema.  Lymphadenopathy:    He has no cervical adenopathy.  Neurological: He is alert and oriented to person, place, and time. No cranial nerve deficit. Coordination normal.  Skin: Skin is warm and dry. No rash noted. He is not diaphoretic. No erythema. No pallor.  Psychiatric: He has a normal mood and affect. His behavior is normal. Judgment and thought content normal.          Assessment & Plan:   Problem List Items Addressed This Visit      Unprioritized   Anxiety and depression - Primary    Symptoms improved with Lexapro, however still some residual anxiety symptoms. Will increase Lexapro to 20mg  daily. Follow up in 4 weeks.      Relevant Medications   escitalopram (LEXAPRO) tablet   Hypertension    BP Readings from Last 3 Encounters:  07/27/14 160/78  06/23/14 153/83  05/02/14 158/70   BP slightly elevated, however okay for his age. Will continue Lisinopril. Follow up with cardiology as scheduled and here in 4 weeks.  Return in about 4 weeks (around 08/24/2014) for Recheck.

## 2014-08-19 ENCOUNTER — Encounter: Payer: Self-pay | Admitting: Internal Medicine

## 2014-08-23 ENCOUNTER — Telehealth: Payer: Self-pay | Admitting: Internal Medicine

## 2014-08-23 NOTE — Telephone Encounter (Signed)
Please see below note, Pt is on the schedule for an appt with you tomorrow morning

## 2014-08-23 NOTE — Telephone Encounter (Signed)
Pt daughter Allen David) called to ask Dr. Gilford Rile to check pt BP. Pt told her that his BP has been running high. Mrs. Wall was advise that she is not listed on DPR and asked that the patient be reminded to add her to [facility]. Mrs. Wall states that she is concern about BP. Yesterday evening he had a BP of 174/100 and around 10:30 last night it was 180/103. Mrs. Wall thinks since pt has been on Lexapro the pt BP has been elevated.

## 2014-08-24 ENCOUNTER — Ambulatory Visit (INDEPENDENT_AMBULATORY_CARE_PROVIDER_SITE_OTHER): Payer: Medicare Other | Admitting: Internal Medicine

## 2014-08-24 ENCOUNTER — Encounter: Payer: Self-pay | Admitting: Internal Medicine

## 2014-08-24 VITALS — BP 182/81 | HR 61 | Temp 97.6°F | Ht 70.0 in | Wt 207.2 lb

## 2014-08-24 DIAGNOSIS — F419 Anxiety disorder, unspecified: Secondary | ICD-10-CM

## 2014-08-24 DIAGNOSIS — I10 Essential (primary) hypertension: Secondary | ICD-10-CM

## 2014-08-24 DIAGNOSIS — F418 Other specified anxiety disorders: Secondary | ICD-10-CM

## 2014-08-24 DIAGNOSIS — F329 Major depressive disorder, single episode, unspecified: Secondary | ICD-10-CM

## 2014-08-24 MED ORDER — SERTRALINE HCL 50 MG PO TABS: 50.0000 mg | ORAL_TABLET | Freq: Every day | ORAL | Status: DC

## 2014-08-24 MED ORDER — AMLODIPINE BESYLATE 5 MG PO TABS: 5.0000 mg | ORAL_TABLET | Freq: Every day | ORAL | Status: DC

## 2014-08-24 NOTE — Progress Notes (Signed)
Pre visit review using our clinic review tool, if applicable. No additional management support is needed unless otherwise documented below in the visit note. 

## 2014-08-24 NOTE — Assessment & Plan Note (Signed)
Symptoms of anxiety improved on Lexapro, however he is convinced that this medication is making BP higher. So, will stop the medication and change to Sertraline 50mg  daily at bedtime. Encouraged him to limit alprazolam. Follow up in 2 weeks.

## 2014-08-24 NOTE — Assessment & Plan Note (Signed)
BP Readings from Last 3 Encounters:  08/24/14 182/81  07/27/14 160/78  06/23/14 153/83   BP is elevated. Will add amlodipine 5mg  daily. Recheck BP in 2 weeks and prn.

## 2014-08-24 NOTE — Patient Instructions (Addendum)
Stop Simvastatin. Stop Lexapro.  Start Amlodipine 5mg  daily to help control blood pressure.  Start Sertraline 50mg  daily at bedtime to help with anxiety.  Follow up in 2 weeks.

## 2014-08-24 NOTE — Progress Notes (Signed)
Subjective:    Patient ID: Allen David, male    DOB: 1928/03/30, 81 y.o.   MRN: [id number]  HPI  81YO male presents for acute visit.  HTN - BP has recently been elevated. He is concerned this may be related to use of Lexapro. BP at home has been 409-811B systolic. Held Lexapro for 4 days and had improvement in BP per his report. Tried Zoloft and Paxil in past with no improvement.  Seen by physician at [facility], however no changes were made to medication.  Feels that anxiety is much better controlled on Lexapro.  Past medical, surgical, family and social history per today's encounter.  Review of Systems  Constitutional: Negative for fever, chills, activity change, appetite change, fatigue and unexpected weight change.  Eyes: Negative for visual disturbance.  Respiratory: Negative for cough and shortness of breath.   Cardiovascular: Negative for chest pain, palpitations and leg swelling.  Gastrointestinal: Negative for abdominal pain and abdominal distention.  Genitourinary: Negative for dysuria, urgency and difficulty urinating.  Musculoskeletal: Negative for arthralgias and gait problem.  Skin: Negative for color change and rash.  Hematological: Negative for adenopathy.  Psychiatric/Behavioral: Negative for sleep disturbance and dysphoric mood. The patient is nervous/anxious.        Objective:    BP 182/81 mmHg  Pulse 61  Temp(Src) 97.6 F (36.4 C) (Oral)  Ht 5\' 10"  (1.778 m)  Wt 207 lb 4 oz (94.008 kg)  BMI 29.74 kg/m2  SpO2 98% Physical Exam  Constitutional: He is oriented to person, place, and time. He appears well-developed and well-nourished. No distress.  HENT:  Head: Normocephalic and atraumatic.  Right Ear: External ear normal.  Left Ear: External ear normal.  Nose: Nose normal.  Mouth/Throat: Oropharynx is clear and moist. No oropharyngeal exudate.  Eyes: Conjunctivae and EOM are normal. Pupils are equal, round, and reactive to light. Right eye exhibits no  discharge. Left eye exhibits no discharge. No scleral icterus.  Neck: Normal range of motion. Neck supple. No tracheal deviation present. No thyromegaly present.  Cardiovascular: Normal rate, regular rhythm and normal heart sounds.  Exam reveals no gallop and no friction rub.   No murmur heard. Pulmonary/Chest: Effort normal and breath sounds normal. No accessory muscle usage. No tachypnea. No respiratory distress. He has no decreased breath sounds. He has no wheezes. He has no rhonchi. He has no rales. He exhibits no tenderness.  Musculoskeletal: Normal range of motion. He exhibits no edema.  Lymphadenopathy:    He has no cervical adenopathy.  Neurological: He is alert and oriented to person, place, and time. No cranial nerve deficit. Coordination normal.  Skin: Skin is warm and dry. No rash noted. He is not diaphoretic. No erythema. No pallor.  Psychiatric: Judgment and thought content normal. His mood appears anxious. His speech is rapid and/or pressured and tangential. He is agitated.          Assessment & Plan:   Problem List Items Addressed This Visit      Unprioritized   Anxiety and depression    Symptoms of anxiety improved on Lexapro, however he is convinced that this medication is making BP higher. So, will stop the medication and change to Sertraline 50mg  daily at bedtime. Encouraged him to limit alprazolam. Follow up in 2 weeks.      Hypertension - Primary    BP Readings from Last 3 Encounters:  08/24/14 182/81  07/27/14 160/78  06/23/14 153/83   BP is elevated. Will add amlodipine 5mg   daily. Recheck BP in 2 weeks and prn.      Relevant Medications   amLODIpine (NORVASC) tablet       Return in about 2 weeks (around 09/07/2014) for Recheck.

## 2014-08-28 ENCOUNTER — Encounter: Payer: Self-pay | Admitting: Internal Medicine

## 2014-09-04 ENCOUNTER — Encounter: Payer: Self-pay | Admitting: Internal Medicine

## 2014-09-04 ENCOUNTER — Ambulatory Visit (INDEPENDENT_AMBULATORY_CARE_PROVIDER_SITE_OTHER): Payer: Medicare Other | Admitting: Internal Medicine

## 2014-09-04 VITALS — BP 152/79 | HR 64 | Temp 97.6°F | Ht 70.0 in | Wt 206.5 lb

## 2014-09-04 DIAGNOSIS — M25562 Pain in left knee: Secondary | ICD-10-CM | POA: Diagnosis not present

## 2014-09-04 DIAGNOSIS — I10 Essential (primary) hypertension: Secondary | ICD-10-CM

## 2014-09-04 DIAGNOSIS — F418 Other specified anxiety disorders: Secondary | ICD-10-CM

## 2014-09-04 DIAGNOSIS — F32A Depression, unspecified: Secondary | ICD-10-CM

## 2014-09-04 DIAGNOSIS — M25561 Pain in right knee: Secondary | ICD-10-CM | POA: Diagnosis not present

## 2014-09-04 DIAGNOSIS — F329 Major depressive disorder, single episode, unspecified: Secondary | ICD-10-CM

## 2014-09-04 DIAGNOSIS — F419 Anxiety disorder, unspecified: Principal | ICD-10-CM

## 2014-09-04 NOTE — Patient Instructions (Signed)
Continue current medications    Follow up in 3 months

## 2014-09-04 NOTE — Assessment & Plan Note (Signed)
Mild arthralgia bilateral knees. Recommended using prn Tylenol for knee pain. Follow up if symptoms are not well controlled.

## 2014-09-04 NOTE — Assessment & Plan Note (Signed)
BP Readings from Last 3 Encounters:  09/04/14 152/79  08/24/14 182/81  07/27/14 160/78   BP improved on Amlodipine. Will continue Lisinopril and Amlodipine. Follow up 3 months and prn.

## 2014-09-04 NOTE — Assessment & Plan Note (Signed)
Symptoms improved on Sertraline. Will continue sertraline and prn Alprazolam.

## 2014-09-04 NOTE — Progress Notes (Signed)
Pre visit review using our clinic review tool, if applicable. No additional management support is needed unless otherwise documented below in the visit note. 

## 2014-09-04 NOTE — Progress Notes (Signed)
Subjective:    Patient ID: Allen David, male    DOB: [birth year], 81 y.o.   MRN: [id number]  HPI  81YO male presents for follow up. Last seen 4/7. He was concerned that Lexapro was causing increased BP, so changed to Sertraline. Also added Amlodipine 5mg  daily.  HTN - BP improved with SBP near 120 at home. No chest pain, headache.  Anxiety - Symptoms have been well controlled on Sertraline. Feels more like himself. Energy level improved. Not taking as much Alprazolam.  Notes some aching pains in knees on occasion. Questions what OTC med would be safe to use.    Past medical, surgical, family and social history per today's encounter.  Review of Systems  Constitutional: Negative for fever, chills, activity change, appetite change, fatigue and unexpected weight change.  Eyes: Negative for visual disturbance.  Respiratory: Negative for cough and shortness of breath.   Cardiovascular: Negative for chest pain, palpitations and leg swelling.  Gastrointestinal: Negative for abdominal pain and abdominal distention.  Genitourinary: Negative for dysuria, urgency and difficulty urinating.  Musculoskeletal: Positive for arthralgias. Negative for gait problem.  Skin: Negative for color change and rash.  Neurological: Negative for dizziness, light-headedness and headaches.  Hematological: Negative for adenopathy.  Psychiatric/Behavioral: Negative for sleep disturbance and dysphoric mood. The patient is not nervous/anxious.        Objective:    BP 152/79 mmHg  Pulse 64  Temp(Src) 97.6 F (36.4 C) (Oral)  Ht 5\' 10"  (1.778 m)  Wt 206 lb 8 oz (93.668 kg)  BMI 29.63 kg/m2  SpO2 100% Physical Exam  Constitutional: He is oriented to person, place, and time. He appears well-developed and well-nourished. No distress.  HENT:  Head: Normocephalic and atraumatic.  Right Ear: External ear normal.  Left Ear: External ear normal.  Nose: Nose normal.  Mouth/Throat: Oropharynx is clear and  moist. No oropharyngeal exudate.  Eyes: Conjunctivae and EOM are normal. Pupils are equal, round, and reactive to light. Right eye exhibits no discharge. Left eye exhibits no discharge. No scleral icterus.  Neck: Normal range of motion. Neck supple. No tracheal deviation present. No thyromegaly present.  Cardiovascular: Normal rate, regular rhythm and normal heart sounds.  Exam reveals no gallop and no friction rub.   No murmur heard. Pulmonary/Chest: Effort normal and breath sounds normal. No accessory muscle usage. No tachypnea. No respiratory distress. He has no decreased breath sounds. He has no wheezes. He has no rhonchi. He has no rales. He exhibits no tenderness.  Musculoskeletal: Normal range of motion. He exhibits no edema.  Lymphadenopathy:    He has no cervical adenopathy.  Neurological: He is alert and oriented to person, place, and time. No cranial nerve deficit. Coordination normal.  Skin: Skin is warm and dry. No rash noted. He is not diaphoretic. No erythema. No pallor.  Psychiatric: He has a normal mood and affect. His behavior is normal. Judgment and thought content normal.          Assessment & Plan:   Problem List Items Addressed This Visit      Unprioritized   Anxiety and depression - Primary    Symptoms improved on Sertraline. Will continue sertraline and prn Alprazolam.      Arthralgia of both knees    Mild arthralgia bilateral knees. Recommended using prn Tylenol for knee pain. Follow up if symptoms are not well controlled.      Hypertension    BP Readings from Last 3 Encounters:  09/04/14 152/79  08/24/14 182/81  07/27/14 160/78   BP improved on Amlodipine. Will continue Lisinopril and Amlodipine. Follow up 3 months and prn.          Return in about 3 months (around 12/04/2014).

## 2014-09-15 ENCOUNTER — Encounter: Payer: Self-pay | Admitting: Cardiovascular Disease

## 2014-09-15 ENCOUNTER — Ambulatory Visit (INDEPENDENT_AMBULATORY_CARE_PROVIDER_SITE_OTHER): Payer: Medicare Other | Admitting: Cardiovascular Disease

## 2014-09-15 VITALS — BP 110/58 | HR 63 | Ht 69.0 in | Wt 201.8 lb

## 2014-09-15 DIAGNOSIS — I10 Essential (primary) hypertension: Secondary | ICD-10-CM | POA: Diagnosis not present

## 2014-09-15 DIAGNOSIS — F419 Anxiety disorder, unspecified: Secondary | ICD-10-CM

## 2014-09-15 DIAGNOSIS — E785 Hyperlipidemia, unspecified: Secondary | ICD-10-CM | POA: Diagnosis not present

## 2014-09-15 DIAGNOSIS — I25119 Atherosclerotic heart disease of native coronary artery with unspecified angina pectoris: Secondary | ICD-10-CM | POA: Diagnosis not present

## 2014-09-15 DIAGNOSIS — F329 Major depressive disorder, single episode, unspecified: Secondary | ICD-10-CM

## 2014-09-15 DIAGNOSIS — F418 Other specified anxiety disorders: Secondary | ICD-10-CM

## 2014-09-15 NOTE — Assessment & Plan Note (Signed)
Cholesterol is remarkably low, currently on no statin. Numbers discussed with him.

## 2014-09-15 NOTE — Assessment & Plan Note (Signed)
He reports that he feels fine, no significant anxiety issues.

## 2014-09-15 NOTE — Assessment & Plan Note (Signed)
Currently with no symptoms of angina. No further workup at this time. Continue current medication regimen. 

## 2014-09-15 NOTE — Assessment & Plan Note (Signed)
Blood pressure currently well-controlled. 032 systolic on my recheck. He denies any lightheadedness or dizziness, no orthostasis. No medication changes made Suspect recent period of hypertension was secondary to medication change or anxiety.

## 2014-09-15 NOTE — Patient Instructions (Signed)
You are doing well. No medication changes were made.  Please call us if you have new issues that need to be addressed before your next appt.  Your physician wants you to follow-up in: 12 months.  You will receive a reminder letter in the mail two months in advance. If you don't receive a letter, please call our office to schedule the follow-up appointment. 
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
## 2014-12-04 ENCOUNTER — Encounter: Payer: Self-pay | Admitting: Internal Medicine

## 2014-12-04 ENCOUNTER — Ambulatory Visit (INDEPENDENT_AMBULATORY_CARE_PROVIDER_SITE_OTHER): Payer: Medicare Other | Admitting: Internal Medicine

## 2014-12-04 VITALS — BP 120/67 | HR 60 | Temp 97.7°F | Ht 70.0 in | Wt 205.5 lb

## 2014-12-04 DIAGNOSIS — I25119 Atherosclerotic heart disease of native coronary artery with unspecified angina pectoris: Secondary | ICD-10-CM

## 2014-12-04 DIAGNOSIS — R5382 Chronic fatigue, unspecified: Secondary | ICD-10-CM | POA: Diagnosis not present

## 2014-12-04 DIAGNOSIS — F419 Anxiety disorder, unspecified: Principal | ICD-10-CM

## 2014-12-04 DIAGNOSIS — F32A Depression, unspecified: Secondary | ICD-10-CM

## 2014-12-04 DIAGNOSIS — F418 Other specified anxiety disorders: Secondary | ICD-10-CM

## 2014-12-04 DIAGNOSIS — F329 Major depressive disorder, single episode, unspecified: Secondary | ICD-10-CM

## 2014-12-04 DIAGNOSIS — I10 Essential (primary) hypertension: Secondary | ICD-10-CM

## 2014-12-04 LAB — CBC WITH DIFFERENTIAL/PLATELET
BASOS ABS: 0 10*3/uL (ref 0.0–0.1)
BASOS PCT: 0.6 % (ref 0.0–3.0)
Eosinophils Absolute: 0.2 10*3/uL (ref 0.0–0.7)
Eosinophils Relative: 2.5 % (ref 0.0–5.0)
HEMATOCRIT: 39.3 % (ref 39.0–52.0)
HEMOGLOBIN: 13 g/dL (ref 13.0–17.0)
LYMPHS PCT: 33.3 % (ref 12.0–46.0)
Lymphs Abs: 2.1 10*3/uL (ref 0.7–4.0)
MCHC: 33.1 g/dL (ref 30.0–36.0)
MCV: 96.7 fl (ref 78.0–100.0)
MONO ABS: 0.6 10*3/uL (ref 0.1–1.0)
Monocytes Relative: 10.1 % (ref 3.0–12.0)
Neutro Abs: 3.4 10*3/uL (ref 1.4–7.7)
Neutrophils Relative %: 53.5 % (ref 43.0–77.0)
Platelets: 243 10*3/uL (ref 150.0–400.0)
RBC: 4.07 Mil/uL — ABNORMAL LOW (ref 4.22–5.81)
RDW: 14.8 % (ref 11.5–15.5)
WBC: 6.3 10*3/uL (ref 4.0–10.5)

## 2014-12-04 LAB — COMPREHENSIVE METABOLIC PANEL
ALT: 22 U/L (ref 0–53)
AST: 28 U/L (ref 0–37)
Albumin: 4 g/dL (ref 3.5–5.2)
Alkaline Phosphatase: 36 U/L — ABNORMAL LOW (ref 39–117)
BUN: 21 mg/dL (ref 6–23)
CO2: 23 mEq/L (ref 19–32)
Calcium: 9 mg/dL (ref 8.4–10.5)
Chloride: 107 mEq/L (ref 96–112)
Creatinine, Ser: 1.26 mg/dL (ref 0.40–1.50)
GFR: 57.75 mL/min — ABNORMAL LOW (ref >60.00)
Glucose, Bld: 82 mg/dL (ref 70–99)
Potassium: 4.2 mEq/L (ref 3.5–5.1)
Sodium: 140 mEq/L (ref 135–145)
Total Bilirubin: 0.4 mg/dL (ref 0.2–1.2)
Total Protein: 7.1 g/dL (ref 6.0–8.3)

## 2014-12-04 LAB — VITAMIN B12: Vitamin B-12: 340 pg/mL (ref 211–911)

## 2014-12-04 LAB — TSH: TSH: 3.11 u[IU]/mL (ref 0.35–4.50)

## 2014-12-04 MED ORDER — SERTRALINE HCL 100 MG PO TABS: 100.0000 mg | ORAL_TABLET | Freq: Every day | ORAL | Status: DC

## 2014-12-04 NOTE — Patient Instructions (Signed)
Please increase Sertraline to 100mg  daily.  Continue Alprazolam as needed for anxiety.  Labs today.

## 2014-12-04 NOTE — Progress Notes (Signed)
Subjective:    Patient ID: Allen David, male    DOB: 1927-05-30, 82 y.o.   MRN: [id number]  HPI  81YO male presents for follow up.  Feeling more tired recently. No focal symptoms. Notes apathy. Reports some increased symptoms of depression. Denies suicidal ideation. Would like to increase Sertraline.  Also notes some increased anxiety recently. Taking Alprazolam 4 times daily.    Past medical, surgical, family and social history per today's encounter.  Review of Systems  Constitutional: Positive for fatigue. Negative for fever, chills, activity change, appetite change and unexpected weight change.  Eyes: Negative for visual disturbance.  Respiratory: Negative for cough and shortness of breath.   Cardiovascular: Negative for chest pain, palpitations and leg swelling.  Gastrointestinal: Negative for nausea, vomiting, abdominal pain, diarrhea, constipation and abdominal distention.  Genitourinary: Negative for dysuria, urgency and difficulty urinating.  Musculoskeletal: Negative for arthralgias and gait problem.  Skin: Negative for color change and rash.  Hematological: Negative for adenopathy.  Psychiatric/Behavioral: Positive for dysphoric mood. Negative for suicidal ideas and sleep disturbance. The patient is nervous/anxious.        Objective:    BP 120/67 mmHg  Pulse 60  Temp(Src) 97.7 F (36.5 C) (Oral)  Ht 5\' 10"  (1.778 m)  Wt 205 lb 8 oz (93.214 kg)  BMI 29.49 kg/m2  SpO2 96% Physical Exam  Constitutional: He is oriented to person, place, and time. He appears well-developed and well-nourished. No distress.  HENT:  Head: Normocephalic and atraumatic.  Right Ear: External ear normal.  Left Ear: External ear normal.  Nose: Nose normal.  Mouth/Throat: Oropharynx is clear and moist. No oropharyngeal exudate.  Eyes: Conjunctivae and EOM are normal. Pupils are equal, round, and reactive to light. Right eye exhibits no discharge. Left eye exhibits no discharge. No  scleral icterus.  Neck: Normal range of motion. Neck supple. No tracheal deviation present. No thyromegaly present.  Cardiovascular: Normal rate, regular rhythm and normal heart sounds.  Exam reveals no gallop and no friction rub.   No murmur heard. Pulmonary/Chest: Effort normal and breath sounds normal. No accessory muscle usage. No tachypnea. No respiratory distress. He has no decreased breath sounds. He has no wheezes. He has no rhonchi. He has no rales. He exhibits no tenderness.  Musculoskeletal: Normal range of motion. He exhibits no edema.  Lymphadenopathy:    He has no cervical adenopathy.  Neurological: He is alert and oriented to person, place, and time. No cranial nerve deficit. Coordination normal.  Skin: Skin is warm and dry. No rash noted. He is not diaphoretic. No erythema. No pallor.  Psychiatric: His speech is normal and behavior is normal. Judgment and thought content normal. Cognition and memory are normal. He exhibits a depressed mood. He expresses no suicidal ideation.          Assessment & Plan:   Problem List Items Addressed This Visit      Unprioritized   Anxiety and depression - Primary    Worsening symptoms of depression. Increased apathy. Will increase Sertraline to 100mg  daily. Recheck in 4 weeks or sooner as needed.      Fatigue    Chronic fatigue. Will check B12, CBC, CMP, TSH today with labs. Also addressing worsening depression symptoms. Follow up recheck in 4 weeks.      Relevant Orders   TSH   CBC with Differential/Platelet   Comprehensive metabolic panel   U76   Hypertension    BP Readings from Last 3 Encounters:  12/04/14  120/67  09/15/14 110/58  09/04/14 152/79   BP well controlled. Continue current medications.          Return in about 4 weeks (around 01/01/2015) for Recheck.  r

## 2014-12-04 NOTE — Assessment & Plan Note (Signed)
Chronic fatigue. Will check B12, CBC, CMP, TSH today with labs. Also addressing worsening depression symptoms. Follow up recheck in 4 weeks.

## 2014-12-04 NOTE — Assessment & Plan Note (Signed)
Worsening symptoms of depression. Increased apathy. Will increase Sertraline to 100mg  daily. Recheck in 4 weeks or sooner as needed.

## 2014-12-04 NOTE — Progress Notes (Signed)
Pre visit review using our clinic review tool, if applicable. No additional management support is needed unless otherwise documented below in the visit note. 

## 2014-12-04 NOTE — Assessment & Plan Note (Signed)
BP Readings from Last 3 Encounters:  12/04/14 120/67  09/15/14 110/58  09/04/14 152/79   BP well controlled. Continue current medications.

## 2014-12-12 ENCOUNTER — Other Ambulatory Visit (INDEPENDENT_AMBULATORY_CARE_PROVIDER_SITE_OTHER): Payer: Medicare Other

## 2014-12-12 ENCOUNTER — Telehealth: Payer: Self-pay | Admitting: *Deleted

## 2014-12-12 DIAGNOSIS — N289 Disorder of kidney and ureter, unspecified: Secondary | ICD-10-CM

## 2014-12-12 LAB — BASIC METABOLIC PANEL
BUN: 21 mg/dL (ref 6–23)
CALCIUM: 9.4 mg/dL (ref 8.4–10.5)
CO2: 22 meq/L (ref 19–32)
Chloride: 106 mEq/L (ref 96–112)
Creatinine, Ser: 1.21 mg/dL (ref 0.40–1.50)
GFR: 60.51 mL/min (ref >60.00)
GLUCOSE: 85 mg/dL (ref 70–99)
Potassium: 4.5 mEq/L (ref 3.5–5.1)
Sodium: 138 mEq/L (ref 135–145)

## 2014-12-12 NOTE — Telephone Encounter (Signed)
BMP for acute renal insufficiency

## 2014-12-12 NOTE — Telephone Encounter (Signed)
Lab and dx?

## 2015-01-01 ENCOUNTER — Encounter: Payer: Self-pay | Admitting: Internal Medicine

## 2015-01-01 ENCOUNTER — Ambulatory Visit (INDEPENDENT_AMBULATORY_CARE_PROVIDER_SITE_OTHER): Payer: Medicare Other | Admitting: Internal Medicine

## 2015-01-01 VITALS — BP 132/72 | HR 66 | Temp 97.9°F | Ht 70.0 in | Wt 198.0 lb

## 2015-01-01 DIAGNOSIS — I10 Essential (primary) hypertension: Secondary | ICD-10-CM

## 2015-01-01 DIAGNOSIS — I25119 Atherosclerotic heart disease of native coronary artery with unspecified angina pectoris: Secondary | ICD-10-CM

## 2015-01-01 DIAGNOSIS — R5383 Other fatigue: Secondary | ICD-10-CM | POA: Diagnosis not present

## 2015-01-01 DIAGNOSIS — F418 Other specified anxiety disorders: Secondary | ICD-10-CM

## 2015-01-01 DIAGNOSIS — L989 Disorder of the skin and subcutaneous tissue, unspecified: Secondary | ICD-10-CM | POA: Insufficient documentation

## 2015-01-01 DIAGNOSIS — F419 Anxiety disorder, unspecified: Principal | ICD-10-CM

## 2015-01-01 DIAGNOSIS — F329 Major depressive disorder, single episode, unspecified: Secondary | ICD-10-CM

## 2015-01-01 MED ORDER — FLUOXETINE HCL 20 MG PO TABS
20.0000 mg | ORAL_TABLET | Freq: Every day | ORAL | Status: DC
Start: 2015-01-01 — End: 2015-02-08

## 2015-01-01 MED ORDER — GENTAMICIN SULFATE 0.1 % EX OINT: 1.0000 "application " | TOPICAL_OINTMENT | Freq: Three times a day (TID) | CUTANEOUS | Status: DC

## 2015-01-01 NOTE — Assessment & Plan Note (Signed)
BP Readings from Last 3 Encounters:  01/01/15 132/72  12/04/14 120/67  09/15/14 110/58   BP well controlled on Amlodipine. Will continue.

## 2015-01-01 NOTE — Assessment & Plan Note (Signed)
Symptoms poorly controlled on Sertraline. Discussed options including increasing Sertraline further or change back to Fluoxetine. Also discussed referral to psychiatry. He prefers to try change back to Fluoxetine. Start 20mg  daily. Follow up in 2 weeks. Continue prn Alprazolam.

## 2015-01-01 NOTE — Progress Notes (Signed)
Subjective:    Patient ID: Allen David, male    DOB: 1928/01/19, 81 y.o.   MRN: [id number]  HPI  81YO male presents for follow up. His daughter is with him today.  Last seen 7/18. Worsening symptoms of depression. Increased dose of Sertraline to see if any improvement. Also complained of fatigue at that visit. Lab evaluation was normal including thyroid function and B12.  Continues to feel fatigued. Also notes some irritability. He increased dose of Sertraline to 180m. No improvement noted yet. In past has been on Prozac and Zoloft. Would like to consider going back to one of these medications.  HTN - Stopped Lisinopril. BP has been well controlled.  Skin rash - [place] for psoriasis [place] [facility] [place] a new sore on his left forearm which has been oozing fluid. No fever, chills.   Wt Readings from Last 3 Encounters:  01/01/15 198 lb (89.812 kg)  12/04/14 205 lb 8 oz (93.214 kg)  09/15/14 201 lb 12 oz (91.513 kg)    Past Medical History  Diagnosis Date  . HTN (hypertension)   . HLD (hyperlipidemia)   . Depression   . IBS (irritable bowel syndrome)   . Back injury     with chronic pain   Family History  Problem Relation Age of Onset  . Stroke Mother   . Hypertension Mother   . Ulcers Mother   . Cancer Father     multiple myeloma  . Diabetes Sister   . Diabetes Brother   . Diabetes Brother    Past Surgical History  Procedure Laterality Date  . Vasectomy     Social History   Social History  . Marital Status: Single    Spouse Name: N/A  . Number of Children: N/A  . Years of Education: N/A   Occupational History  . Retired    Social History Main Topics  . Smoking status: Former Smoker    Quit date: 05/19/1966  . Smokeless tobacco: None     Comment: Quit in 1968  . Alcohol Use: 0.0 oz/week    1-2 Cans of beer per week     Comment: daily  . Drug Use: No  . Sexual Activity: Not Asked   Other Topics Concern  . None   Social  History Narrative   [place]. Children, 4 girls.      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      Regularly exercise- rides bike and mows lawn. Divorced.    Review of Systems  Constitutional: Positive for fatigue. Negative for fever, chills, activity change, appetite change and unexpected weight change.  Eyes: Negative for visual disturbance.  Respiratory: Negative for cough and shortness of breath.   Cardiovascular: Negative for chest pain, palpitations and leg swelling.  Gastrointestinal: Negative for abdominal pain and abdominal distention.  Genitourinary: Negative for dysuria, urgency and difficulty urinating.  Musculoskeletal: Negative for arthralgias and gait problem.  Skin: Negative for color change and rash.  Hematological: Negative for adenopathy.  Psychiatric/Behavioral: Positive for sleep disturbance and dysphoric mood. Negative for suicidal ideas. The patient is nervous/anxious.        Objective:    BP 132/72 mmHg  Pulse 66  Temp(Src) 97.9 F (36.6 C) (Oral)  Ht '5\' 10"'  (1.778 m)  Wt 198 lb (89.812 kg)  BMI 28.41 kg/m2  SpO2 96% Physical Exam  Constitutional: He is oriented  to person, place, and time. He appears well-developed and well-nourished. No distress.  HENT:  Head: Normocephalic and atraumatic.  Right Ear: External ear normal.  Left Ear: External ear normal.  Nose: Nose normal.  Mouth/Throat: Oropharynx is clear and moist. No oropharyngeal exudate.  Eyes: Conjunctivae and EOM are normal. Pupils are equal, round, and reactive to light. Right eye exhibits no discharge. Left eye exhibits no discharge. No scleral icterus.  Neck: Normal range of motion. Neck supple. No tracheal deviation present. No thyromegaly present.  Cardiovascular: Normal rate, regular rhythm and normal heart sounds.  Exam reveals no gallop and no friction rub.   No murmur heard. Pulmonary/Chest: Effort normal and breath  sounds normal. No accessory muscle usage. No tachypnea. No respiratory distress. He has no decreased breath sounds. He has no wheezes. He has no rhonchi. He has no rales. He exhibits no tenderness.  Musculoskeletal: Normal range of motion. He exhibits no edema.  Lymphadenopathy:    He has no cervical adenopathy.  Neurological: He is alert and oriented to person, place, and time. No cranial nerve deficit. Coordination normal.  Skin: Skin is warm and dry. Lesion noted. No rash noted. He is not diaphoretic. No erythema. No pallor.     Psychiatric: His speech is normal and behavior is normal. Judgment and thought content normal. His mood appears anxious. Cognition and memory are normal. He exhibits a depressed mood. He expresses no suicidal ideation.          Assessment & Plan:  Over 72mn of which >50% spent in face-to-face contact with patient discussing plan of care  Problem List Items Addressed This Visit      Unprioritized   Anxiety and depression - Primary    Symptoms poorly controlled on Sertraline. Discussed options including increasing Sertraline further or change back to Fluoxetine. Also discussed referral to psychiatry. He prefers to try change back to Fluoxetine. Start 284mdaily. Follow up in 2 weeks. Continue prn Alprazolam.      Relevant Medications   FLUoxetine (PROZAC) 20 MG tablet   Fatigue    Generalized fatigue. Likely related to anxiety and depression. Recent lab evaluation was normal. Will try treating anxiety/depression and follow up 4 weeks.      Hypertension    BP Readings from Last 3 Encounters:  01/01/15 132/72  12/04/14 120/67  09/15/14 110/58   BP well controlled on Amlodipine. Will continue.      Skin lesion    Small ulceration left forearm with purulent drainage. Will start topical Gentamicin bid. Follow up prn.      Relevant Medications   gentamicin ointment (GARAMYCIN) 0.1 %       Return in about 2 weeks (around 01/15/2015).

## 2015-01-01 NOTE — Patient Instructions (Addendum)
Stop Sertraline. Start Fluoxetine (Prozac) 20mg  daily in the morning. We will see you back in 2 weeks.  Start Gentamicin twice daily to skin lesion.

## 2015-01-01 NOTE — Assessment & Plan Note (Signed)
Generalized fatigue. Likely related to anxiety and depression. Recent lab evaluation was normal. Will try treating anxiety/depression and follow up 4 weeks.

## 2015-01-01 NOTE — Assessment & Plan Note (Signed)
Small ulceration left forearm with purulent drainage. Will start topical Gentamicin bid. Follow up prn.

## 2015-01-01 NOTE — Progress Notes (Signed)
Pre visit review using our clinic review tool, if applicable. No additional management support is needed unless otherwise documented below in the visit note. 

## 2015-01-15 ENCOUNTER — Ambulatory Visit: Payer: No Typology Code available for payment source | Admitting: Internal Medicine

## 2015-02-05 ENCOUNTER — Encounter: Payer: Self-pay | Admitting: Internal Medicine

## 2015-02-08 ENCOUNTER — Ambulatory Visit (INDEPENDENT_AMBULATORY_CARE_PROVIDER_SITE_OTHER): Payer: Medicare Other | Admitting: Internal Medicine

## 2015-02-08 ENCOUNTER — Encounter: Payer: Self-pay | Admitting: Internal Medicine

## 2015-02-08 VITALS — BP 147/81 | HR 61 | Temp 98.0°F | Ht 70.0 in | Wt 199.4 lb

## 2015-02-08 DIAGNOSIS — I25119 Atherosclerotic heart disease of native coronary artery with unspecified angina pectoris: Secondary | ICD-10-CM | POA: Diagnosis not present

## 2015-02-08 DIAGNOSIS — I10 Essential (primary) hypertension: Secondary | ICD-10-CM | POA: Diagnosis not present

## 2015-02-08 DIAGNOSIS — F418 Other specified anxiety disorders: Secondary | ICD-10-CM

## 2015-02-08 DIAGNOSIS — F329 Major depressive disorder, single episode, unspecified: Secondary | ICD-10-CM

## 2015-02-08 DIAGNOSIS — F419 Anxiety disorder, unspecified: Principal | ICD-10-CM

## 2015-02-08 MED ORDER — FLUOXETINE HCL 20 MG PO TABS: 40.0000 mg | ORAL_TABLET | Freq: Every day | ORAL | Status: DC

## 2015-02-08 NOTE — Progress Notes (Signed)
Subjective:    Patient ID: Allen David, male    DOB: 1927/07/19, 81 y.o.   MRN: [id number]  HPI  81YO male presents for follow up.  Last seen 8/15 for anxiety and depression. Changed bact to Fluoxetine. In the interim, increased dose of Fluoxetine to 84m daily. Started this dose about 2 weeks. Notes some improvement in anxiety and depressed mood. Fluoxetine seems to work better for him. Also continues to use Alprazolam as needed.  HTN - Compliant with medication. No CP, HA, palpitations. Does not check BP at home generally.   Wt Readings from Last 3 Encounters:  02/08/15 199 lb 6 oz (90.436 kg)  01/01/15 198 lb (89.812 kg)  12/04/14 205 lb 8 oz (93.214 kg)   BP Readings from Last 3 Encounters:  02/08/15 147/81  01/01/15 132/72  12/04/14 120/67      Past Medical History  Diagnosis Date  . HTN (hypertension)   . HLD (hyperlipidemia)   . Depression   . IBS (irritable bowel syndrome)   . Back injury     with chronic pain   Family History  Problem Relation Age of Onset  . Stroke Mother   . Hypertension Mother   . Ulcers Mother   . Cancer Father     multiple myeloma  . Diabetes Sister   . Diabetes Brother   . Diabetes Brother    Past Surgical History  Procedure Laterality Date  . Vasectomy     Social History   Social History  . Marital Status: Single    Spouse Name: N/A  . Number of Children: N/A  . Years of Education: N/A   Occupational History  . Retired    Social History Main Topics  . Smoking status: Former Smoker    Quit date: 05/19/1966  . Smokeless tobacco: None     Comment: Quit in 1968  . Alcohol Use: 0.0 oz/week    1-2 Cans of beer per week     Comment: daily  . Drug Use: No  . Sexual Activity: Not Asked   Other Topics Concern  . None   Social History Narrative   [place] in [place]. Children, 4 girls.      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      Regularly exercise-  rides bike and mows lawn. Divorced.    Review of Systems  Constitutional: Negative for fever, chills, activity change, appetite change, fatigue and unexpected weight change.  Eyes: Negative for visual disturbance.  Respiratory: Negative for cough and shortness of breath.   Cardiovascular: Negative for chest pain, palpitations and leg swelling.  Gastrointestinal: Negative for abdominal pain, diarrhea, constipation and abdominal distention.  Genitourinary: Negative for dysuria, urgency and difficulty urinating.  Musculoskeletal: Negative for myalgias, arthralgias and gait problem.  Skin: Negative for color change and rash.  Hematological: Negative for adenopathy.  Psychiatric/Behavioral: Negative for suicidal ideas, sleep disturbance and dysphoric mood. The patient is not nervous/anxious.        Objective:    BP 147/81 mmHg  Pulse 61  Temp(Src) 98 F (36.7 C) (Oral)  Ht _0  (1.778 m)  Wt 199 lb 6 oz (90.436 kg)  BMI 28.61 kg/m2  SpO2 98% Physical Exam  Constitutional: He is oriented to person, place, and time. He appears well-developed and well-nourished. No distress.  HENT:  Head: Normocephalic and atraumatic.  Right Ear: External ear normal.  Left Ear: External ear  normal.  Nose: Nose normal.  Mouth/Throat: Oropharynx is clear and moist. No oropharyngeal exudate.  Eyes: Conjunctivae and EOM are normal. Pupils are equal, round, and reactive to light. Right eye exhibits no discharge. Left eye exhibits no discharge. No scleral icterus.  Neck: Normal range of motion. Neck supple. No tracheal deviation present. No thyromegaly present.  Cardiovascular: Normal rate, regular rhythm and normal heart sounds.  Exam reveals no gallop and no friction rub.   No murmur heard. Pulmonary/Chest: Effort normal and breath sounds normal. No accessory muscle usage. No tachypnea. No respiratory distress. He has no decreased breath sounds. He has no wheezes. He has no rhonchi. He has no rales. He  exhibits no tenderness.  Musculoskeletal: Normal range of motion. He exhibits no edema.  Lymphadenopathy:    He has no cervical adenopathy.  Neurological: He is alert and oriented to person, place, and time. No cranial nerve deficit. Coordination normal.  Skin: Skin is warm and dry. No rash noted. He is not diaphoretic. No erythema. No pallor.  Psychiatric: He has a normal mood and affect. His speech is normal and behavior is normal. Judgment and thought content normal. He expresses no suicidal ideation.          Assessment & Plan:   Problem List Items Addressed This Visit      Unprioritized   Anxiety and depression - Primary    Continue Fluoxetine 58m daily. Limit use of alprazolam. Discussed risks of this medication. Follow up in 6 months and sooner as needed.      Relevant Medications   FLUoxetine (PROZAC) 20 MG tablet   Hypertension    BP Readings from Last 3 Encounters:  02/08/15 147/81  01/01/15 132/72  12/04/14 120/67   BP well controlled generally. Continue Amlodipine. Follow up in 6 months.          Return in about 6 months (around 08/08/2015) for Recheck.

## 2015-02-08 NOTE — Progress Notes (Signed)
Pre visit review using our clinic review tool, if applicable. No additional management support is needed unless otherwise documented below in the visit note. 

## 2015-02-08 NOTE — Assessment & Plan Note (Signed)
Continue Fluoxetine 40mg  daily. Limit use of alprazolam. Discussed risks of this medication. Follow up in 6 months and sooner as needed.

## 2015-02-08 NOTE — Patient Instructions (Signed)
Continue current medications.  Follow up in 6 months or sooner as needed. 

## 2015-02-08 NOTE — Assessment & Plan Note (Signed)
BP Readings from Last 3 Encounters:  02/08/15 147/81  01/01/15 132/72  12/04/14 120/67   BP well controlled generally. Continue Amlodipine. Follow up in 6 months.

## 2015-06-06 ENCOUNTER — Telehealth: Payer: Self-pay

## 2015-06-06 NOTE — Telephone Encounter (Signed)
Patient is requesting amlodipine 10 mg but I see 5 mg in the pt's chart. Please advise.

## 2015-06-07 NOTE — Telephone Encounter (Signed)
Would clarify what dose he is taking, If he is cutting the pill in 1/2? When did he go from 5 to 10 mg?

## 2015-06-08 MED ORDER — AMLODIPINE BESYLATE 5 MG PO TABS: 5.0000 mg | ORAL_TABLET | Freq: Every day | ORAL | Status: DC

## 2015-06-08 NOTE — Telephone Encounter (Signed)
The patient says he is on the amlodipine 5 mg not the 10 mg. Refill sent for amlodipine 5 mg take one tablet daily.

## 2015-06-08 NOTE — Telephone Encounter (Signed)
LMOM for the patient to contact our office with the correct dose of amlodipine; 5 mg or 10 mg.

## 2015-06-11 ENCOUNTER — Ambulatory Visit (INDEPENDENT_AMBULATORY_CARE_PROVIDER_SITE_OTHER): Payer: Medicare Other | Admitting: Internal Medicine

## 2015-06-11 ENCOUNTER — Encounter: Payer: Self-pay | Admitting: Internal Medicine

## 2015-06-11 VITALS — BP 136/70 | HR 60 | Temp 97.5°F | Ht 70.0 in | Wt 197.8 lb

## 2015-06-11 DIAGNOSIS — F418 Other specified anxiety disorders: Secondary | ICD-10-CM

## 2015-06-11 DIAGNOSIS — F329 Major depressive disorder, single episode, unspecified: Secondary | ICD-10-CM

## 2015-06-11 DIAGNOSIS — I10 Essential (primary) hypertension: Secondary | ICD-10-CM

## 2015-06-11 DIAGNOSIS — F419 Anxiety disorder, unspecified: Principal | ICD-10-CM

## 2015-06-11 LAB — COMPREHENSIVE METABOLIC PANEL
ALK PHOS: 41 U/L (ref 39–117)
ALT: 19 U/L (ref 0–53)
AST: 23 U/L (ref 0–37)
Albumin: 4.2 g/dL (ref 3.5–5.2)
BILIRUBIN TOTAL: 0.5 mg/dL (ref 0.2–1.2)
BUN: 22 mg/dL (ref 6–23)
CO2: 27 mEq/L (ref 19–32)
CREATININE: 1.06 mg/dL (ref 0.40–1.50)
Calcium: 9.1 mg/dL (ref 8.4–10.5)
Chloride: 104 mEq/L (ref 96–112)
GFR: 70.41 mL/min (ref >60.00)
GLUCOSE: 93 mg/dL (ref 70–99)
Potassium: 4.2 mEq/L (ref 3.5–5.1)
SODIUM: 138 meq/L (ref 135–145)
TOTAL PROTEIN: 7.2 g/dL (ref 6.0–8.3)

## 2015-06-11 LAB — CBC WITH DIFFERENTIAL/PLATELET
BASOS ABS: 0 10*3/uL (ref 0.0–0.1)
BASOS PCT: 0.4 % (ref 0.0–3.0)
EOS ABS: 0.1 10*3/uL (ref 0.0–0.7)
Eosinophils Relative: 1.9 % (ref 0.0–5.0)
HCT: 40.9 % (ref 39.0–52.0)
HEMOGLOBIN: 13.3 g/dL (ref 13.0–17.0)
Lymphocytes Relative: 37.2 % (ref 12.0–46.0)
Lymphs Abs: 2.4 10*3/uL (ref 0.7–4.0)
MCHC: 32.6 g/dL (ref 30.0–36.0)
MCV: 97.5 fl (ref 78.0–100.0)
MONO ABS: 0.5 10*3/uL (ref 0.1–1.0)
Monocytes Relative: 7 % (ref 3.0–12.0)
Neutro Abs: 3.5 10*3/uL (ref 1.4–7.7)
Neutrophils Relative %: 53.5 % (ref 43.0–77.0)
Platelets: 256 10*3/uL (ref 150.0–400.0)
RBC: 4.19 Mil/uL — ABNORMAL LOW (ref 4.22–5.81)
RDW: 14.9 % (ref 11.5–15.5)
WBC: 6.6 10*3/uL (ref 4.0–10.5)

## 2015-06-11 LAB — TSH: TSH: 3.27 u[IU]/mL (ref 0.35–4.50)

## 2015-06-11 LAB — VITAMIN B12: VITAMIN B 12: 462 pg/mL (ref 211–911)

## 2015-06-11 MED ORDER — HYDROXYZINE HCL 10 MG PO TABS: 10.0000 mg | ORAL_TABLET | Freq: Every day | ORAL | Status: DC

## 2015-06-11 NOTE — Assessment & Plan Note (Signed)
Symptoms of anxiety are worsening. We discussed risks of long term use of benzodiazepines. It appears that he is not actually receiving psychiatric care at the [place]. Will set up local psychiatry evaluation. Limit use of Alprazolam. Continue Fluoxetine. Add Hydroxyzine at night. Recommended home health evaluation to assess medication compliance and home safety, however pt daughter declines. Follow up in 4 weeks.

## 2015-06-11 NOTE — Assessment & Plan Note (Signed)
BP Readings from Last 3 Encounters:  06/11/15 136/70  02/08/15 147/81  01/01/15 132/72   BP well controlled. Clarified that he should be taking Amlodipine 5mg  daily.

## 2015-06-11 NOTE — Progress Notes (Signed)
Pre visit review using our clinic review tool, if applicable. No additional management support is needed unless otherwise documented below in the visit note. 

## 2015-06-11 NOTE — Patient Instructions (Addendum)
Labs today.  We will set up an evaluation with psychiatry.  Start Hydroxyzine 10mg  daily at bedtime to help with anxiety.  Follow up in 3 months and sooner as needed.

## 2015-06-11 NOTE — Progress Notes (Signed)
Subjective:    Patient ID: Allen David, male    DOB: 05-Mar-1928, 82 y.o.   MRN: [id number]  HPI  82YO male presents for follow up.  Last seen in 01/2015. Continued on Fluoxetine and Alprazolam for anxiety and depression.  "Physically" feeling well. However, having some increased anxiety over last few months.Allen David at [facility] No psychiatrist at the [place]. No counseling in place. Consistently taking Xanax 4 times per day. Also taking Prozac with no improvement. Tried changing to Lexapro with elevated BP. Unable to sleep at night. Feels quick to get angry. Constantly worried. Daughter notes he has not been as social, not caring for himself. She is concerned that he is not taking his medication as prescribed. She is currently living with him however not helping with medication management.  Wt Readings from Last 3 Encounters:  06/11/15 197 lb 12 oz (89.699 kg)  02/08/15 199 lb 6 oz (90.436 kg)  01/01/15 198 lb (89.812 kg)   BP Readings from Last 3 Encounters:  06/11/15 136/70  02/08/15 147/81  01/01/15 132/72    Past Medical History  Diagnosis Date  . HTN (hypertension)   . HLD (hyperlipidemia)   . Depression   . IBS (irritable bowel syndrome)   . Back injury     with chronic pain   Family History  Problem Relation Age of Onset  . Stroke Mother   . Hypertension Mother   . Ulcers Mother   . Cancer Father     multiple myeloma  . Diabetes Sister   . Diabetes Brother   . Diabetes Brother    Past Surgical History  Procedure Laterality Date  . Vasectomy     Social History   Social History  . Marital Status: Single    Spouse Name: N/A  . Number of Children: N/A  . Years of Education: N/A   Occupational History  . Retired    Social History Main Topics  . Smoking status: Former Smoker    Quit date: 05/19/1966  . Smokeless tobacco: Former Systems developer     Comment: Quit in 1968  . Alcohol Use: 0.0 oz/week    1-2 Cans of beer per week     Comment: daily  .  Drug Use: No  . Sexual Activity: Not Asked   Other Topics Concern  . None   Social History Narrative   Lives in [place] alone. Children, 4 girls.      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      Regularly exercise- rides bike and mows lawn. Divorced.    Review of Systems  Constitutional: Negative for fever, chills, activity change, appetite change, fatigue and unexpected weight change.  Eyes: Negative for visual disturbance.  Respiratory: Negative for cough and shortness of breath.   Cardiovascular: Negative for chest pain, palpitations and leg swelling.  Gastrointestinal: Negative for abdominal pain and abdominal distention.  Genitourinary: Negative for dysuria, urgency and difficulty urinating.  Musculoskeletal: Negative for arthralgias and gait problem.  Skin: Negative for color change and rash.  Hematological: Negative for adenopathy.  Psychiatric/Behavioral: Negative for sleep disturbance and dysphoric mood. The patient is not nervous/anxious.        Objective:    BP 136/70 mmHg  Pulse 60  Temp(Src) 97.5 F (36.4 C) (Oral)  Ht _0  (1.778 m)  Wt 197 lb 12 oz (89.699 kg)  BMI 28.37 kg/m2  SpO2 96% Physical Exam  Constitutional: He is oriented to person, place, and time. He appears well-developed and well-nourished. No distress.  HENT:  Head: Normocephalic and atraumatic.  Right Ear: External ear normal.  Left Ear: External ear normal.  Nose: Nose normal.  Mouth/Throat: Oropharynx is clear and moist. No oropharyngeal exudate.  Eyes: Conjunctivae and EOM are normal. Pupils are equal, round, and reactive to light. Right eye exhibits no discharge. Left eye exhibits no discharge. No scleral icterus.  Neck: Normal range of motion. Neck supple. No tracheal deviation present. No thyromegaly present.  Cardiovascular: Normal rate, regular rhythm and normal heart sounds.  Exam reveals no gallop and no friction rub.   No  murmur heard. Pulmonary/Chest: Effort normal and breath sounds normal. No accessory muscle usage. No tachypnea. No respiratory distress. He has no decreased breath sounds. He has no wheezes. He has no rhonchi. He has no rales. He exhibits no tenderness.  Musculoskeletal: Normal range of motion. He exhibits no edema.  Lymphadenopathy:    He has no cervical adenopathy.  Neurological: He is alert and oriented to person, place, and time. No cranial nerve deficit. Coordination normal.  Skin: Skin is warm and dry. No rash noted. He is not diaphoretic. No erythema. No pallor.  Psychiatric: Judgment and thought content normal. His mood appears anxious. His speech is rapid and/or pressured and tangential. He is agitated. He exhibits abnormal recent memory.          Assessment & Plan:   Problem List Items Addressed This Visit      Unprioritized   Anxiety and depression - Primary    Symptoms of anxiety are worsening. We discussed risks of long term use of benzodiazepines. It appears that he is not actually receiving psychiatric care at the [place]. Will set up local psychiatry evaluation. Limit use of Alprazolam. Continue Fluoxetine. Add Hydroxyzine at night. Recommended home health evaluation to assess medication compliance and home safety, however pt daughter declines. Follow up in 4 weeks.      Relevant Orders   Ambulatory referral to Psychiatry   CBC with Differential/Platelet (Completed)   TSH (Completed)   B12 (Completed)   Hypertension    BP Readings from Last 3 Encounters:  06/11/15 136/70  02/08/15 147/81  01/01/15 132/72   BP well controlled. Clarified that he should be taking Amlodipine 1m daily.       Relevant Orders   Comprehensive metabolic panel (Completed)       Return in about 3 months (around 09/09/2015) for Recheck.

## 2015-06-20 ENCOUNTER — Ambulatory Visit: Payer: Medicare Other | Admitting: Licensed Clinical Social Worker

## 2015-06-22 ENCOUNTER — Ambulatory Visit (INDEPENDENT_AMBULATORY_CARE_PROVIDER_SITE_OTHER): Payer: Medicare Other | Admitting: Licensed Clinical Social Worker

## 2015-06-22 DIAGNOSIS — F32A Depression, unspecified: Secondary | ICD-10-CM

## 2015-06-22 DIAGNOSIS — F329 Major depressive disorder, single episode, unspecified: Secondary | ICD-10-CM | POA: Diagnosis not present

## 2015-06-22 DIAGNOSIS — F411 Generalized anxiety disorder: Secondary | ICD-10-CM | POA: Diagnosis not present

## 2015-06-22 NOTE — Progress Notes (Signed)
Comprehensive Clinical Assessment (CCA) Note  06/22/2015 YORDIN MAIS [id number]  Visit Diagnosis:      ICD-9-CM ICD-10-CM   1. GAD (generalized anxiety disorder) 300.02 F41.1   2. Depression 311 F32.9       CCA Part One  Part One has been completed on paper by the patient.  (See scanned document in Chart Review)  CCA Part Two A  Intake/Chief Complaint:  CCA Intake With Chief Complaint CCA Part Two Date: 06/22/15 CCA Part Two Time: 1500 Chief Complaint/Presenting Problem: Dr. Gilford Rile told me to come Patients Currently Reported Symptoms/Problems: anxiety, I have been on medication for a while. Has difficulty dealing with stress, worries, overwhelmed Individual's Strengths: Dealer, maintenance, working on things, solve mechanical problems, A&P license Individual's Preferences: to get better Individual's Abilities: motivated for services Type of Services Patient Feels Are Needed: to get over anxiety  Mental Health Symptoms Depression:  Depression: Tearfulness, Irritability, Difficulty Concentrating  Mania:  Mania: N/A  Anxiety:   Anxiety: Difficulty concentrating, Fatigue, Irritability, Sleep, Tension, Worrying  Psychosis:  Psychosis: N/A  Trauma:  Trauma: N/A  Obsessions:  Obsessions: N/A  Compulsions:  Compulsions: N/A  Inattention:  Inattention: N/A  Hyperactivity/Impulsivity:  Hyperactivity/Impulsivity: N/A  Oppositional/Defiant Behaviors:  Oppositional/Defiant Behaviors: N/A  Borderline Personality:  Emotional Irregularity: N/A  Other Mood/Personality Symptoms:      Mental Status Exam Appearance and self-care  Stature:  Stature: Average  Weight:  Weight: Average weight  Clothing:  Clothing: Casual  Grooming:  Grooming: Normal  Cosmetic use:  Cosmetic Use: None  Posture/gait:  Posture/Gait: Normal  Motor activity:  Motor Activity: Not Remarkable  Sensorium  Attention:  Attention: Normal  Concentration:  Concentration: Normal  Orientation:  Orientation: X5   Recall/memory:  Recall/Memory: Normal  Affect and Mood  Affect:  Affect: Anxious  Mood:  Mood: Anxious  Relating  Eye contact:  Eye Contact: Normal  Facial expression:  Facial Expression: Responsive  Attitude toward examiner:  Attitude Toward Examiner: Cooperative  Thought and Language  Speech flow: Speech Flow: Normal  Thought content:  Thought Content: Appropriate to mood and circumstances  Preoccupation:     Hallucinations:     Organization:     Transport planner of Knowledge:  Fund of Knowledge: Average  Intelligence:  Intelligence: Average  Abstraction:  Abstraction: Normal  Judgement:  Judgement: Normal  Reality Testing:  Reality Testing: Adequate  Insight:  Insight: Good  Decision Making:  Decision Making: Normal  Social Functioning  Social Maturity:  Social Maturity: Responsible  Social Judgement:  Social Judgement: Normal  Stress  Stressors:  Stressors: Family conflict, Grief/losses  Coping Ability:  Coping Ability: English as a second language teacher Deficits:     Supports:      Family and Psychosocial History: Family history Marital status: Divorced Divorced, when?: 20 years ago Are you sexually active?: No What is your sexual orientation?: heterosexual Does patient have children?: Yes How many children?: 4 (Twila 23s, Melinda 15s, Sandra 38, Amy 65s) How is patient's relationship with their children?: [name] lives with him.  Twila lives in [place].  Melinda rocky relationship.  Amy good  Childhood History:  Childhood History By whom was/is the patient raised?: Both parents Additional childhood history information: Born in [place].  Has 5 siblings.  All currently living.  Describes childhood as "[place].  We worked every morning.  Get up Get the cows" Description of patient's relationship with caregiver when they were a child: Both  Parents were very strict Patient's description of current relationship with people who  raised him/her: Mother & Father deceased How were you disciplined when you got in trouble as a child/adolescent?: "My mother believed in the switch" Does patient have siblings?: Yes Number of Siblings: 5 Description of patient's current relationship with siblings: "We have a good relationship.  we check on each other all the time.  We love to get together.  We are one big country family.  We have very few problems." Did patient suffer any verbal/emotional/physical/sexual abuse as a child?: No Did patient suffer from severe childhood neglect?: No Has patient ever been sexually abused/assaulted/raped as an adolescent or adult?: No Was the patient ever a victim of a crime or a disaster?: No Witnessed domestic violence?: No Has patient been effected by domestic violence as an adult?: No  CCA Part Two B  Employment/Work Situation: Employment / Work Copywriter, advertising Employment situation: Retired Archivist job has been impacted by current illness: No What is the longest time patient has a held a job?: 40 years Where was the patient employed at that time?: [employer] Has patient ever been in the [employer]?: Yes (Describe in comment) ([employer] 1955-1957; [employer] [phone]) Has patient ever served in combat?: No Did You Receive Any Psychiatric Treatment/Services While in the [employer]?: No Are There Guns or Other Weapons in [place]?: Yes Types of Guns/Weapons: 2 handguns, 4 rifles Are These Psychologist, educational?: Yes Who Could Verify You Are Able To Have These Secured:: [name] [phone]  Education: Education Last Grade Completed: 12 Name of [place]: [employer] in [place] in 1955 Did You Graduate From [employer]?: Yes Did Physicist, medical?: Yes (about 6 months) What Type of College Degree Do you Have?: [employer] Did [place]?: No Did You Have An Individualized Education Program (IIEP): No Did You Have Any Difficulty At School?:  No  Religion: Religion/Spirituality Are You A Religious Person?: Yes 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) How Might This Affect Treatment?: denies  Leisure/Recreation: Leisure / Recreation Leisure and Hobbies: hunt, fishing, camping, Warden/ranger, nature walks  Exercise/Diet: Exercise/Diet Do You Exercise?: Yes What Type of Exercise Do You Do?: Run/Walk How Many Times a Week Do You Exercise?: Daily Have You Gained or Lost A Significant Amount of Weight in the Past Six Months?: No Do You Follow a Special Diet?: No Do You Have Any Trouble Sleeping?: Yes Explanation of Sleeping Difficulties: difficulty with falling asleep  CCA Part Two C  Alcohol/Drug Use: Alcohol / Drug Use Prescriptions: Amlodipine Besylate 100mg , Hydroxyzine HCL 10mg , Alprazolam 1mg , Fluoxetine HCL 20mg  Over the Counter: B12, Complete Multivitamin, Chewable Asirin 81mg  History of alcohol / drug use?: No history of alcohol / drug abuse                      CCA Part Three  ASAM's:  Six Dimensions of Multidimensional Assessment  Dimension 1:  Acute Intoxication and/or Withdrawal Potential:     Dimension 2:  Biomedical Conditions and Complications:     Dimension 3:  Emotional, Behavioral, or Cognitive Conditions and Complications:     Dimension 4:  Readiness to Change:     Dimension 5:  Relapse, Continued use, or Continued Problem Potential:     Dimension 6:  Recovery/Living Environment:      Substance use Disorder (SUD)    Social Function:  Social Functioning Social Maturity: Responsible Social Judgement: Normal  Stress:  Stress Stressors: Family conflict, Grief/losses Coping Ability: Overwhelmed Patient Takes Medications The Way The Doctor Instructed?: Yes Priority Risk: Low Acuity  Risk Assessment- Self-Harm Potential: Risk Assessment For Self-Harm Potential Thoughts of Self-Harm: No current thoughts Method: No plan Availability of Means: No  access/NA  Risk Assessment -Dangerous to Others Potential: Risk Assessment For Dangerous to Others Potential Method: No Plan Availability of Means: No access or NA Intent: Vague intent or NA Notification Required: No need or identified person  DSM5 Diagnoses: Patient Active Problem List   Diagnosis Date Noted  . Skin lesion 01/01/2015  . Arthralgia of both knees 09/04/2014  . Pernicious anemia 12/20/2013  . Medicare annual wellness visit, subsequent 12/08/2012  . Hypertension 06/03/2012  . Chronic low back pain 06/03/2012  . Anxiety and depression 06/03/2012  . Hyperlipidemia 01/20/2012  . CAD, NATIVE VESSEL 01/09/2010    Recommendations for Services/Supports/Treatments: Recommendations for Services/Supports/Treatments Recommendations For Services/Supports/Treatments: Medication Management, Individual Therapy  Treatment Plan Summary:    Referrals to Alternative Service(s): Referred to Alternative Service(s):   Place:   Date:   Time:    Referred to Alternative Service(s):   Place:   Date:   Time:    Referred to Alternative Service(s):   Place:   Date:   Time:    Referred to Alternative Service(s):   Place:   Date:   Time:     [place]

## 2015-07-09 ENCOUNTER — Ambulatory Visit (INDEPENDENT_AMBULATORY_CARE_PROVIDER_SITE_OTHER): Payer: Medicare Other | Admitting: Psychiatry

## 2015-07-09 ENCOUNTER — Encounter: Payer: Self-pay | Admitting: Psychiatry

## 2015-07-09 ENCOUNTER — Telehealth: Payer: Self-pay | Admitting: Psychiatry

## 2015-07-09 VITALS — BP 124/78 | HR 73 | Temp 98.4°F | Ht 70.0 in | Wt 204.2 lb

## 2015-07-09 DIAGNOSIS — F418 Other specified anxiety disorders: Secondary | ICD-10-CM

## 2015-07-09 DIAGNOSIS — F329 Major depressive disorder, single episode, unspecified: Secondary | ICD-10-CM

## 2015-07-09 DIAGNOSIS — F419 Anxiety disorder, unspecified: Principal | ICD-10-CM

## 2015-07-09 MED ORDER — FLUOXETINE HCL 20 MG PO TABS: 50.0000 mg | ORAL_TABLET | Freq: Every day | ORAL | Status: DC

## 2015-07-09 NOTE — Progress Notes (Signed)
Psychiatric Initial Adult Assessment   Patient Identification: Allen David MRN:  [id number] Date of Evaluation:  07/09/2015 Referral Source: [facility] Chief Complaint:  Anxiety Visit Diagnosis: GAD, MDD Diagnosis:   Patient Active Problem List   Diagnosis Date Noted  . Skin lesion [L98.9] 01/01/2015  . Arthralgia of both knees [M25.561, M25.562] 09/04/2014  . Pernicious anemia [D51.0] 12/20/2013  . Medicare annual wellness visit, subsequent [Z00.00] 12/08/2012  . Hypertension [I10] 06/03/2012  . Chronic low back pain [M54.5, G89.29] 06/03/2012  . Anxiety and depression [F41.8] 06/03/2012  . Hyperlipidemia [E78.5] 01/20/2012  . CAD, NATIVE VESSEL [I25.10] 01/09/2010   History of Present Illness:  Patient is an 82 year old Caucasian male with a history of anxiety and depression who was referred for follow-up by his therapist. Allen David was seen with his daughter today for an evaluation of anxiety and Mood. He reports seeing his PCP for a long time for his Anxiety states he was tried on several different medications including Zoloft. States they did not work well for him. Currently he is taking Prozac at 57m and states it has helped some. Reports feeling down these days, not feeling motivated to get up and do anything. Per patient's daughter he starts projects and gets frustrated easily and moves onto a different one. She states that when he takes Xanax he sleeps quite a bit and that has led to some increased pain due to his arthritis. Patient reports that his lost his fiance about 3 years ago suddenly due to heart attack and since then he has been more depressed and anxious. He also reports that some family dynamics cause him to become more anxious. He states that when he feels his anxiety coming on he takes a Xanax pill. He reports that he has not been exercising as usual. He denies any suicidal ideations. Never been hospitalized psychiatrically. Denies any suicidal thoughts and he  has never had any suicide attempts. Denies abuse of alcohol or other drugs.  Associated Signs/Symptoms: Depression Symptoms:  depressed mood, anhedonia, psychomotor agitation, fatigue, hopelessness, anxiety, (Hypo) Manic Symptoms:  denies Anxiety Symptoms:  Excessive Worry, Psychotic Symptoms:  denies PTSD Symptoms: none  Past Medical History:  Past Medical History  Diagnosis Date  . HTN (hypertension)   . HLD (hyperlipidemia)   . Depression   . IBS (irritable bowel syndrome)   . Back injury     with chronic pain    Past Surgical History  Procedure Laterality Date  . Vasectomy     Family History:  Family History  Problem Relation Age of Onset  . Stroke Mother   . Hypertension Mother   . Ulcers Mother   . Cancer Father     multiple myeloma  . Diabetes Sister   . Diabetes Brother   . Diabetes Brother    Social History:   Social History   Social History  . Marital Status: Single    Spouse Name: N/A  . Number of Children: N/A  . Years of Education: N/A   Occupational History  . Retired    Social History Main Topics  . Smoking status: Former Smoker    Quit date: 05/19/1966  . Smokeless tobacco: Former [employer] developer    Comment: Quit in 1968  . Alcohol Use: 0.0 oz/week    1-2 Cans of beer per week     Comment: daily  . Drug Use: No  . Sexual Activity: Not on file   Other Topics Concern  . Not on file  Social History Narrative   Lives in [place] alone. Children, 4 girls.      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      Regularly exercise- rides bike and mows lawn. Divorced.   Additional Social History: Patient reports that he was [place] and has been hard-working in all his life. He worked as an Producer, television/film/video for several years. He was also in the [employer] and worked as a Runner, broadcasting/film/video for 6 years. Denies any combat exposure. He divorced his wife 20 years ago. He had been with his fiance for the past 9  years.  Musculoskeletal: Strength & Muscle Tone: decreased Gait & Station: broad based Patient leans: N/A  Psychiatric Specialty Exam: HPI  ROS  There were no vitals taken for this visit.There is no weight on file to calculate BMI.  General Appearance: Casual  Eye Contact:  Fair  Speech:  Garbled  Volume:  Normal  Mood:  Depressed and Dysphoric  Affect:  Constricted and Depressed  Thought Process:  Circumstantial  Orientation:  Full (Time, Place, and Person)  Thought Content:  Rumination  Suicidal Thoughts:  No  Homicidal Thoughts:  No  Memory:  Immediate;   Fair Recent;   Fair Remote;   Fair  Judgement:  Fair  Insight:  Fair  Psychomotor Activity:  Decreased  Concentration:  Fair  Recall:  [employer] of Knowledge:Fair  Language: Fair  Akathisia:  No  Handed:  Right  AIMS (if indicated):    Assets:  Communication Skills Desire for Improvement Financial Resources/Insurance Housing Social Support  ADL's:  Intact  Cognition: WNL  Sleep:  okay   Is the patient at risk to self?  No. Has the patient been a risk to self in the past 6 months?  No. Has the patient been a risk to self within the distant past?  No. Is the patient a risk to others?  No. Has the patient been a risk to others in the past 6 months?  No. Has the patient been a risk to others within the distant past?  No.  Allergies:   Allergies  Allergen Reactions  . Lexapro [Escitalopram Oxalate]     hypertension  . Tetanus Toxoids Swelling   Current Medications: Current Outpatient Prescriptions  Medication Sig Dispense Refill  . Acetaminophen (TYLENOL ARTHRITIS PAIN PO) Take by mouth as needed.     . ALPRAZolam (XANAX) 1 MG tablet Take 1 mg by mouth 4 (four) times daily as needed for sleep.    Marland Kitchen amLODipine (NORVASC) 5 MG tablet Take 1 tablet (5 mg total) by mouth daily. 90 tablet 3  . aspirin (ASPIR-81) 81 MG EC tablet Take 162 mg by mouth daily.      . Calcium-Magnesium-Zinc  (CALCIUM-MAGNESUIUM-ZINC PO) Take by mouth daily.    Marland Kitchen FLUoxetine (PROZAC) 20 MG tablet Take 2 tablets (40 mg total) by mouth daily. 60 tablet 6  . gentamicin ointment (GARAMYCIN) 0.1 % Apply 1 application topically 3 (three) times daily. 15 g 0  . hydrOXYzine (ATARAX/VISTARIL) 10 MG tablet Take 1 tablet (10 mg total) by mouth at bedtime. 30 tablet 3  . Multiple Vitamin (ONE-A-DAY MENS PO) Take by mouth.    . vitamin B-12 (CYANOCOBALAMIN) 250 MCG tablet Take 250 mcg by mouth daily.     No current facility-administered medications for this visit.    Previous Psychotropic Medications: Yes   Substance Abuse History in the  last 12 months:  No.  Consequences of Substance Abuse: Negative  Medical Decision Making:  Review of Psycho-Social Stressors (1), Review and summation of old records (2), Established Problem, Worsening (2) and Review of New Medication or Change in Dosage (2)  Treatment Plan Summary: Medication management   Major depressive disorder Increase Prozac to 50 mg daily. Patient appears confused if he is taking 60 mg or 40 mg, his daughter will check and call back the clinic. Patient and daughter educated about the side effects and benefits  Recommend starting therapy and resources were given. Discussed with patient that talking about the loss of his fiance and working on some cognitive behavioral therapy for his depression would be helpful.  Anxiety Prozac as above and therapy Discussed with patient and his daughter decreasing the Xanax to 1 mg twice daily as needed, since Xanax can lead to increased sedation, memory impairment and falls at the patient's age.  Return to clinic in 1 month's time or call before if necessary.    Allen David 2/20/20171:01 PM

## 2015-07-10 ENCOUNTER — Encounter: Payer: Self-pay | Admitting: Emergency Medicine

## 2015-07-10 ENCOUNTER — Emergency Department: Payer: Medicare Other

## 2015-07-10 ENCOUNTER — Emergency Department
Admission: EM | Admit: 2015-07-10 | Discharge: 2015-07-10 | Disposition: A | Discharge status: Home / Self Care | Payer: Medicare Other | Attending: Emergency Medicine | Admitting: Emergency Medicine

## 2015-07-10 DIAGNOSIS — W19XXXA Unspecified fall, initial encounter: Secondary | ICD-10-CM

## 2015-07-10 DIAGNOSIS — Y9389 Activity, other specified: Secondary | ICD-10-CM | POA: Diagnosis not present

## 2015-07-10 DIAGNOSIS — S0091XA Abrasion of unspecified part of head, initial encounter: Secondary | ICD-10-CM | POA: Diagnosis not present

## 2015-07-10 DIAGNOSIS — T148XXA Other injury of unspecified body region, initial encounter: Secondary | ICD-10-CM

## 2015-07-10 DIAGNOSIS — Y9289 Other specified places as the place of occurrence of the external cause: Secondary | ICD-10-CM | POA: Diagnosis not present

## 2015-07-10 DIAGNOSIS — S0093XA Contusion of unspecified part of head, initial encounter: Secondary | ICD-10-CM | POA: Diagnosis not present

## 2015-07-10 DIAGNOSIS — Z79899 Other long term (current) drug therapy: Secondary | ICD-10-CM | POA: Insufficient documentation

## 2015-07-10 DIAGNOSIS — S0001XA Abrasion of scalp, initial encounter: Secondary | ICD-10-CM | POA: Insufficient documentation

## 2015-07-10 DIAGNOSIS — W01198A Fall on same level from slipping, tripping and stumbling with subsequent striking against other object, initial encounter: Secondary | ICD-10-CM | POA: Diagnosis not present

## 2015-07-10 DIAGNOSIS — I10 Essential (primary) hypertension: Secondary | ICD-10-CM | POA: Diagnosis not present

## 2015-07-10 DIAGNOSIS — Z7982 Long term (current) use of aspirin: Secondary | ICD-10-CM | POA: Insufficient documentation

## 2015-07-10 DIAGNOSIS — Z792 Long term (current) use of antibiotics: Secondary | ICD-10-CM | POA: Diagnosis not present

## 2015-07-10 DIAGNOSIS — S060X1A Concussion with loss of consciousness of 30 minutes or less, initial encounter: Secondary | ICD-10-CM | POA: Diagnosis not present

## 2015-07-10 DIAGNOSIS — Z87891 Personal history of nicotine dependence: Secondary | ICD-10-CM | POA: Insufficient documentation

## 2015-07-10 DIAGNOSIS — T148 Other injury of unspecified body region: Secondary | ICD-10-CM | POA: Insufficient documentation

## 2015-07-10 DIAGNOSIS — Y998 Other external cause status: Secondary | ICD-10-CM | POA: Diagnosis not present

## 2015-07-10 DIAGNOSIS — S0990XA Unspecified injury of head, initial encounter: Secondary | ICD-10-CM | POA: Diagnosis present

## 2015-07-10 DIAGNOSIS — S0083XA Contusion of other part of head, initial encounter: Secondary | ICD-10-CM | POA: Diagnosis not present

## 2015-07-10 NOTE — ED Notes (Addendum)
Pt to ed with c/o fall this am, pt states he lost his balance and fell backwards onto concrete,  Pt with blood noted and hematoma noted to back of head. Pt reports positive loss of consciousness. Pt also reports pain to right shoulder and head. Pt alert and oriented at this time.

## 2015-07-10 NOTE — Discharge Instructions (Signed)
Concussion, Adult  A concussion, or closed-head injury, is a brain injury caused by a direct blow to the head or by a quick and sudden movement (jolt) of the head or neck. Concussions are usually not life-threatening. Even so, the effects of a concussion can be serious. If you have had a concussion before, you are more likely to experience concussion-like symptoms after a direct blow to the head.   CAUSES  · Direct blow to the head, such as from running into another player during a soccer game, being hit in a fight, or hitting your head on a hard surface.  · A jolt of the head or neck that causes the brain to move back and forth inside the skull, such as in a car crash.  SIGNS AND SYMPTOMS  The signs of a concussion can be hard to notice. Early on, they may be missed by you, family members, and health care providers. You may look fine but act or feel differently.  Symptoms are usually temporary, but they may last for days, weeks, or even longer. Some symptoms may appear right away while others may not show up for hours or days. Every head injury is different. Symptoms include:  · Mild to moderate headaches that will not go away.  · A feeling of pressure inside your head.  · Having more trouble than usual:    Learning or remembering things you have heard.    Answering questions.    Paying attention or concentrating.    Organizing daily tasks.    Making decisions and solving problems.  · Slowness in thinking, acting or reacting, speaking, or reading.  · Getting lost or being easily confused.  · Feeling tired all the time or lacking energy (fatigued).  · Feeling drowsy.  · Sleep disturbances.    Sleeping more than usual.    Sleeping less than usual.    Trouble falling asleep.    Trouble sleeping (insomnia).  · Loss of balance or feeling lightheaded or dizzy.  · Nausea or vomiting.  · Numbness or tingling.  · Increased sensitivity to:    Sounds.    Lights.    Distractions.  · Vision problems or eyes that tire  easily.  · Diminished sense of taste or smell.  · Ringing in the ears.  · Mood changes such as feeling sad or anxious.  · Becoming easily irritated or angry for little or no reason.  · Lack of motivation.  · Seeing or hearing things other people do not see or hear (hallucinations).  DIAGNOSIS  Your health care provider can usually diagnose a concussion based on a description of your injury and symptoms. He or she will ask whether you passed out (lost consciousness) and whether you are having trouble remembering events that happened right before and during your injury.  Your evaluation might include:  · A brain scan to look for signs of injury to the brain. Even if the test shows no injury, you may still have a concussion.  · Blood tests to be sure other problems are not present.  TREATMENT  · Concussions are usually treated in an emergency department, in urgent care, or at a clinic. You may need to stay in the hospital overnight for further treatment.  · Tell your health care provider if you are taking any medicines, including prescription medicines, over-the-counter medicines, and natural remedies. Some medicines, such as blood thinners (anticoagulants) and aspirin, may increase the chance of complications. Also tell your health care   provider whether you have had alcohol or are taking illegal drugs. This information may affect treatment.  · Your health care provider will send you home with important instructions to follow.  · How fast you will recover from a concussion depends on many factors. These factors include how severe your concussion is, what part of your brain was injured, your age, and how healthy you were before the concussion.  · Most people with mild injuries recover fully. Recovery can take time. In general, recovery is slower in older persons. Also, persons who have had a concussion in the past or have other medical problems may find that it takes longer to recover from their current injury.  HOME  CARE INSTRUCTIONS  General Instructions  · Carefully follow the directions your health care provider gave you.  · Only take over-the-counter or prescription medicines for pain, discomfort, or fever as directed by your health care provider.  · Take only those medicines that your health care provider has approved.  · Do not drink alcohol until your health care provider says you are well enough to do so. Alcohol and certain other drugs may slow your recovery and can put you at risk of further injury.  · If it is harder than usual to remember things, write them down.  · If you are easily distracted, try to do one thing at a time. For example, do not try to watch TV while fixing dinner.  · Talk with family members or close friends when making important decisions.  · Keep all follow-up appointments. Repeated evaluation of your symptoms is recommended for your recovery.  · Watch your symptoms and tell others to do the same. Complications sometimes occur after a concussion. Older adults with a brain injury may have a higher risk of serious complications, such as a blood clot on the brain.  · Tell your teachers, school nurse, school counselor, coach, athletic trainer, or work manager about your injury, symptoms, and restrictions. Tell them about what you can or cannot do. They should watch for:    Increased problems with attention or concentration.    Increased difficulty remembering or learning new information.    Increased time needed to complete tasks or assignments.    Increased irritability or decreased ability to cope with stress.    Increased symptoms.  · Rest. Rest helps the brain to heal. Make sure you:    Get plenty of sleep at night. Avoid staying up late at night.    Keep the same bedtime hours on weekends and weekdays.    Rest during the day. Take daytime naps or rest breaks when you feel tired.  · Limit activities that require a lot of thought or concentration. These include:    Doing homework or job-related  work.    Watching TV.    Working on the computer.  · Avoid any situation where there is potential for another head injury (football, hockey, soccer, basketball, martial arts, downhill snow sports and horseback riding). Your condition will get worse every time you experience a concussion. You should avoid these activities until you are evaluated by the appropriate follow-up health care providers.  Returning To Your Regular Activities  You will need to return to your normal activities slowly, not all at once. You must give your body and brain enough time for recovery.  · Do not return to sports or other athletic activities until your health care provider tells you it is safe to do so.  · Ask   your health care provider when you can drive, ride a bicycle, or operate heavy machinery. Your ability to react may be slower after a brain injury. Never do these activities if you are dizzy.  · Ask your health care provider about when you can return to work or school.  Preventing Another Concussion  It is very important to avoid another brain injury, especially before you have recovered. In rare cases, another injury can lead to permanent brain damage, brain swelling, or death. The risk of this is greatest during the first 7-10 days after a head injury. Avoid injuries by:  · Wearing a seat belt when riding in a car.  · Drinking alcohol only in moderation.  · Wearing a helmet when biking, skiing, skateboarding, skating, or doing similar activities.  · Avoiding activities that could lead to a second concussion, such as contact or recreational sports, until your health care provider says it is okay.  · Taking safety measures in your home.    Remove clutter and tripping hazards from floors and stairways.    Use grab bars in bathrooms and handrails by stairs.    Place non-slip mats on floors and in bathtubs.    Improve lighting in dim areas.  SEEK MEDICAL CARE IF:  · You have increased problems paying attention or  concentrating.  · You have increased difficulty remembering or learning new information.  · You need more time to complete tasks or assignments than before.  · You have increased irritability or decreased ability to cope with stress.  · You have more symptoms than before.  Seek medical care if you have any of the following symptoms for more than 2 weeks after your injury:  · Lasting (chronic) headaches.  · Dizziness or balance problems.  · Nausea.  · Vision problems.  · Increased sensitivity to noise or light.  · Depression or mood swings.  · Anxiety or irritability.  · Memory problems.  · Difficulty concentrating or paying attention.  · Sleep problems.  · Feeling tired all the time.  SEEK IMMEDIATE MEDICAL CARE IF:  · You have severe or worsening headaches. These may be a sign of a blood clot in the brain.  · You have weakness (even if only in one hand, leg, or part of the face).  · You have numbness.  · You have decreased coordination.  · You vomit repeatedly.  · You have increased sleepiness.  · One pupil is larger than the other.  · You have convulsions.  · You have slurred speech.  · You have increased confusion. This may be a sign of a blood clot in the brain.  · You have increased restlessness, agitation, or irritability.  · You are unable to recognize people or places.  · You have neck pain.  · It is difficult to wake you up.  · You have unusual behavior changes.  · You lose consciousness.  MAKE SURE YOU:  · Understand these instructions.  · Will watch your condition.  · Will get help right away if you are not doing well or get worse.     This information is not intended to replace advice given to you by your health care provider. Make sure you discuss any questions you have with your health care provider.     Document Released: 07/26/2003 Document Revised: 05/26/2014 Document Reviewed: 11/25/2012  Elsevier Interactive Patient Education ©2016 [employer]

## 2015-07-10 NOTE — ED Provider Notes (Signed)
[facility] Emergency Department Provider Note  ____________________________________________  Time seen: Approximately 1 PM  I have reviewed the triage vital signs and the nursing notes.   HISTORY  Chief Complaint Fall and Head Injury    HPI Allen David is a 82 y.o. male with a history of hypertension and anxiety who is presenting today after a fall earlier this morning. The patient says that he was trying to help a friend in a wheelchair down some stairs. He says that he lost balance when the wheelchair flipped over backwards and the patient fell down 2 stairs striking the back of his head on a concrete floor. Per bystanders, the patient lost consciousness for 2 minutes and then regained complete consciousness. He is not complaining of a headache, dizziness or nausea or vomiting at this time. Denies any focal weakness. He says that he has some soreness to his shoulders bilaterally. Says he only takes baby aspirin for anticoagulation. No other anticoagulants.Says only drinks occasionally. Says cannot get a tetanus shot because he is allergic to it.   Past Medical History  Diagnosis Date  . HTN (hypertension)   . HLD (hyperlipidemia)   . Depression   . IBS (irritable bowel syndrome)   . Back injury     with chronic pain  . Anxiety     Patient Active Problem List   Diagnosis Date Noted  . Skin lesion 01/01/2015  . Arthralgia of both knees 09/04/2014  . Pernicious anemia 12/20/2013  . Medicare annual wellness visit, subsequent 12/08/2012  . Hypertension 06/03/2012  . Chronic low back pain 06/03/2012  . Anxiety and depression 06/03/2012  . Hyperlipidemia 01/20/2012  . CAD, NATIVE VESSEL 01/09/2010    Past Surgical History  Procedure Laterality Date  . Vasectomy    . Eye surgery      Current Outpatient Rx  Name  Route  Sig  Dispense  Refill  . Acetaminophen (TYLENOL ARTHRITIS PAIN PO)   Oral   Take 1 tablet by mouth as needed.          .  ALPRAZolam (XANAX) 1 MG tablet   Oral   Take 1 mg by mouth 4 (four) times daily as needed for sleep.         Marland Kitchen amLODipine (NORVASC) 5 MG tablet   Oral   Take 1 tablet (5 mg total) by mouth daily.   90 tablet   3   . aspirin (ASPIR-81) 81 MG EC tablet   Oral   Take 162 mg by mouth daily.           . Calcium-Magnesium-Zinc (CALCIUM-MAGNESUIUM-ZINC PO)   Oral   Take 1 tablet by mouth daily.          Marland Kitchen FLUoxetine (PROZAC) 20 MG tablet   Oral   Take 2.5 tablets (50 mg total) by mouth daily.   60 tablet   0   . gentamicin ointment (GARAMYCIN) 0.1 %   Topical   Apply 1 application topically 3 (three) times daily.   15 g   0   . hydrOXYzine (ATARAX/VISTARIL) 10 MG tablet   Oral   Take 1 tablet (10 mg total) by mouth at bedtime.   30 tablet   3   . Multiple Vitamin (ONE-A-DAY MENS PO)   Oral   Take 1 tablet by mouth daily.          . vitamin B-12 (CYANOCOBALAMIN) 250 MCG tablet   Oral   Take 250 mcg by mouth  daily.           Allergies Lexapro and Tetanus toxoids  Family History  Problem Relation Age of Onset  . Stroke Mother   . Hypertension Mother   . Ulcers Mother   . Cancer Father     multiple myeloma  . Diabetes Sister   . Diabetes Brother   . Diabetes Brother     Social History Social History  Substance Use Topics  . Smoking status: Former Smoker    Quit date: 05/19/1966  . Smokeless tobacco: Former Systems developer     Comment: Quit in 1968  . Alcohol Use: 0.0 - 0.6 oz/week    0-1 Cans of beer per week     Comment: daily    Review of Systems Constitutional: No fever/chills Eyes: No visual changes. ENT: No sore throat. Cardiovascular: Denies chest pain. Respiratory: Denies shortness of breath. Gastrointestinal: No abdominal pain.  No nausea, no vomiting.  No diarrhea.  No constipation. Genitourinary: Negative for dysuria. Musculoskeletal: Negative for back pain. Skin: Negative for rash. Neurological: Negative for headaches, focal weakness  or numbness.  10-point ROS otherwise negative.  ____________________________________________   PHYSICAL EXAM:  VITAL SIGNS: ED Triage Vitals  Enc Vitals Group     BP 07/10/15 0915 174/82 mmHg     Pulse Rate 07/10/15 0915 59     Resp 07/10/15 0915 20     Temp 07/10/15 0915 98.2 F (36.8 C)     Temp Source 07/10/15 0915 Oral     SpO2 07/10/15 0915 100 %     Weight 07/10/15 0915 204 lb (92.534 kg)     Height 07/10/15 0915 _0  (1.88 m)     Head Cir --      Peak Flow --      Pain Score 07/10/15 0915 5     Pain Loc --      Pain Edu? --      Excl. in [place]? --     Constitutional: Alert and oriented. Well appearing and in no acute distress. Eyes: Conjunctivae are normal. PERRL. EOMI. Head: Abrasions over the occiput without any laceration. There is no active bleeding, induration, pus or tenderness to palpation. There is no bogginess or depression. Nose: No congestion/rhinnorhea. Mouth/Throat: Mucous membranes are moist.  Oropharynx non-erythematous. Neck: No stridor.  No cervical spine tenderness. There is no step-off or deformity. The patient is able ranges head and neck fully without any complaints. Cardiovascular: Normal rate, regular rhythm. Grossly normal heart sounds.  Good peripheral circulation. Respiratory: Normal respiratory effort.  No retractions. Lungs CTAB. Gastrointestinal: Soft and nontender. No distention. No abdominal bruits. No CVA tenderness. Musculoskeletal: No lower extremity tenderness nor edema.  No joint effusions. Mild tenderness over the bilateral trapezius muscles. Full strength bilateral upper extremities. There is no sign of trauma here such as bruising, deformity. No tenderness over the bilateral scapulas. No tenderness to the T or L spines. Patient able to ambulate with his normal gait in the room. Neurologic:  Normal speech and language. No gross focal neurologic deficits are appreciated. No gait instability. Skin:  Skin is warm, dry and intact. No rash  noted. Psychiatric: Mood and affect are normal. Speech and behavior are normal.  ____________________________________________   LABS (all labs ordered are listed, but only abnormal results are displayed)  Labs Reviewed - No data to display ____________________________________________  EKG   ____________________________________________  RADIOLOGY  IMPRESSION: 1. Approximately 7.4 cm hematoma about the left-sided the occipital/posterior parietal calvarium without associated radiopaque foreign  body, displaced calvarial fracture or acute intracranial process. 2. Mild likely age-appropriate atrophy and microvascular ischemic disease without acute intracranial process. ____________________________________________   PROCEDURES     INITIAL IMPRESSION / ASSESSMENT AND PLAN / ED COURSE  Pertinent labs & imaging results that were available during my care of the patient were reviewed by me and considered in my medical decision making (see chart for details).  Patient with a CAT scan without any cranial pathology. The fall was mechanical. The patient will be discharged home at this time. I discussed with the patient as well as his daughter was at the bedside that if he starts to develop any headache, nausea vomiting or dizziness that he must come back to the emergency department immediately. However, this time it appears that he only has superficial injuries. The patient's tetanus shot will not be updated because of an allergy listed. We discussed local wound care including soap and water as well as antibiotic ointment which the patient has a home. He will also use it icy hot or Aspercreme to his bilateral trapezius muscles which are sore. We discussed that there will likely be more soreness over the next few days from this injury. They understand the plan and are willing to comply. Will be discharged home. ____________________________________________   FINAL CLINICAL IMPRESSION(S) / ED  DIAGNOSES  Mechanical fall. Concussion. Abrasion.    Orbie Pyo, MD 07/10/15 [phone]

## 2015-07-10 NOTE — Telephone Encounter (Signed)
Pease let patient know that he can continue on the Prozac at 60mg  (3 pills daily).

## 2015-07-11 NOTE — Telephone Encounter (Signed)
left message with instructions about patient medication

## 2015-07-16 ENCOUNTER — Encounter: Payer: Self-pay | Admitting: *Deleted

## 2015-07-18 ENCOUNTER — Encounter: Payer: Self-pay | Admitting: Urology

## 2015-07-18 ENCOUNTER — Ambulatory Visit: Payer: Self-pay | Admitting: Urology

## 2015-08-06 ENCOUNTER — Ambulatory Visit: Payer: Medicare Other | Admitting: Psychiatry

## 2015-08-08 ENCOUNTER — Ambulatory Visit: Payer: No Typology Code available for payment source | Admitting: Internal Medicine

## 2015-08-23 ENCOUNTER — Ambulatory Visit: Payer: Self-pay | Admitting: Psychiatry

## 2015-09-10 ENCOUNTER — Encounter: Payer: Self-pay | Admitting: Internal Medicine

## 2015-09-10 ENCOUNTER — Ambulatory Visit (INDEPENDENT_AMBULATORY_CARE_PROVIDER_SITE_OTHER): Payer: Medicare Other | Admitting: Internal Medicine

## 2015-09-10 VITALS — BP 142/78 | HR 62 | Ht 74.0 in | Wt 201.0 lb

## 2015-09-10 DIAGNOSIS — I10 Essential (primary) hypertension: Secondary | ICD-10-CM

## 2015-09-10 DIAGNOSIS — F32A Depression, unspecified: Secondary | ICD-10-CM

## 2015-09-10 DIAGNOSIS — F419 Anxiety disorder, unspecified: Principal | ICD-10-CM

## 2015-09-10 DIAGNOSIS — F418 Other specified anxiety disorders: Secondary | ICD-10-CM | POA: Diagnosis not present

## 2015-09-10 DIAGNOSIS — F329 Major depressive disorder, single episode, unspecified: Secondary | ICD-10-CM

## 2015-09-10 MED ORDER — CYANOCOBALAMIN 1000 MCG/ML IJ SOLN
1000.0000 ug | Freq: Once | INTRAMUSCULAR | Status: AC
  Administered 2015-09-10: 1000 ug via INTRAMUSCULAR

## 2015-09-10 NOTE — Assessment & Plan Note (Signed)
Symptoms generally well controlled. Taking Prozac 60mg  daily and back on Xanax 4 times per day. Continue to follow up with psychiatry.

## 2015-09-10 NOTE — Addendum Note (Signed)
Addended by: Amado Coe on: 09/10/2015 01:08 PM   Modules accepted: Orders

## 2015-09-10 NOTE — Patient Instructions (Signed)
Continue to follow up with psychiatry.  Follow up here in 3 months.

## 2015-09-10 NOTE — Progress Notes (Signed)
Subjective:    Patient ID: Allen David, male    DOB: October 31, 1927, 82 y.o.   MRN: [id number]  HPI 82YO male presents for follow up.  In the interim since last visit, seen by psychiatry. Increased Prozac to 31m daily. Decreased Xanax to 165mpo bid. Had a fall when helping friend in wheelchair and hit head. Was seen in ED. CT showed hematoma.  Generally, feeling well. Some days mood is up and down. Trying not to dwell on things. Working with coSocial workerLimiting Xanax to 2-4 per day.  No headache or other symptoms after recent fall.   Wt Readings from Last 3 Encounters:  09/10/15 201 lb (91.173 kg)  07/10/15 204 lb (92.534 kg)  07/09/15 204 lb 3.2 oz (92.625 kg)   BP Readings from Last 3 Encounters:  09/10/15 142/78  07/10/15 162/71  07/09/15 124/78    Past Medical History  Diagnosis Date  . HTN (hypertension)   . HLD (hyperlipidemia)   . Depression   . IBS (irritable bowel syndrome)   . Back injury     with chronic pain  . Anxiety   . Pernicious anemia   . Malaise and fatigue   . Chronic low back pain   . CAD in native artery   . Elevated PSA   . ED (erectile dysfunction)    Family History  Problem Relation Age of Onset  . Stroke Mother   . Hypertension Mother   . Ulcers Mother   . Cancer Father     multiple myeloma  . Diabetes Sister   . Diabetes Brother   . Diabetes Brother    Past Surgical History  Procedure Laterality Date  . Vasectomy    . Eye surgery     Social History   Social History  . Marital Status: Single    Spouse Name: N/A  . Number of Children: N/A  . Years of Education: N/A   Occupational History  . Retired    Social History Main Topics  . Smoking status: Former Smoker    Quit date: 05/19/1966  . Smokeless tobacco: Former UsSystems developer   Comment: Quit in 1968  . Alcohol Use: 0.0 - 0.6 oz/week    0-1 Cans of beer per week     Comment: daily  . Drug Use: No  . Sexual Activity: Not Currently   Other Topics Concern  . None    Social History Narrative   [place] Children, 4 girls.      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      Regularly exercise- rides bike and mows lawn. Divorced.    Review of Systems  Constitutional: Negative for fever, chills, activity change, appetite change, fatigue and unexpected weight change.  Eyes: Negative for visual disturbance.  Respiratory: Negative for cough and shortness of breath.   Cardiovascular: Negative for chest pain, palpitations and leg swelling.  Gastrointestinal: Negative for abdominal pain, diarrhea, constipation and abdominal distention.  Genitourinary: Negative for dysuria, urgency and difficulty urinating.  Musculoskeletal: Negative for arthralgias and gait problem.  Skin: Negative for color change and rash.  Neurological: Negative for dizziness, weakness, light-headedness, numbness and headaches.  Hematological: Negative for adenopathy.  Psychiatric/Behavioral: Positive for dysphoric mood. Negative for suicidal ideas and sleep disturbance. The patient is nervous/anxious.        Objective:    BP 142/78 mmHg  Pulse 62  Ht _0  (  1.88 m)  Wt 201 lb (91.173 kg)  BMI 25.80 kg/m2  SpO2 99% Physical Exam  Constitutional: He is oriented to person, place, and time. He appears well-developed and well-nourished. No distress.  HENT:  Head: Normocephalic and atraumatic.  Right Ear: External ear normal.  Left Ear: External ear normal.  Nose: Nose normal.  Mouth/Throat: Oropharynx is clear and moist. No oropharyngeal exudate.  Eyes: Conjunctivae and EOM are normal. Pupils are equal, round, and reactive to light. Right eye exhibits no discharge. Left eye exhibits no discharge. No scleral icterus.  Neck: Normal range of motion. Neck supple. No tracheal deviation present. No thyromegaly present.  Cardiovascular: Normal rate, regular rhythm and normal heart sounds.  Exam reveals no gallop and no  friction rub.   No murmur heard. Pulmonary/Chest: Effort normal and breath sounds normal. No accessory muscle usage. No tachypnea. No respiratory distress. He has no decreased breath sounds. He has no wheezes. He has no rhonchi. He has no rales. He exhibits no tenderness.  Musculoskeletal: Normal range of motion. He exhibits no edema.  Lymphadenopathy:    He has no cervical adenopathy.  Neurological: He is alert and oriented to person, place, and time. No cranial nerve deficit. Coordination normal.  Skin: Skin is warm and dry. No rash noted. He is not diaphoretic. No erythema. No pallor.  Psychiatric: His behavior is normal. Judgment and thought content normal. His mood appears anxious. His speech is tangential.          Assessment & Plan:   Problem List Items Addressed This Visit      Unprioritized   Anxiety and depression - Primary    Symptoms generally well controlled. Taking Prozac 68m daily and back on Xanax 4 times per day. Continue to follow up with psychiatry.      Hypertension    BP Readings from Last 3 Encounters:  09/10/15 142/78  07/10/15 162/71  07/09/15 124/78   BP generally well controlled for age. Will continue current medication.          Return in about 3 months (around 12/10/2015) for Recheck.  JRonette Deter MD Internal Medicine [facility]

## 2015-09-10 NOTE — Assessment & Plan Note (Signed)
BP Readings from Last 3 Encounters:  09/10/15 142/78  07/10/15 162/71  07/09/15 124/78   BP generally well controlled for age. Will continue current medication.

## 2015-09-17 ENCOUNTER — Ambulatory Visit (INDEPENDENT_AMBULATORY_CARE_PROVIDER_SITE_OTHER): Payer: Medicare Other | Admitting: Cardiovascular Disease

## 2015-09-17 ENCOUNTER — Encounter: Payer: Self-pay | Admitting: Cardiovascular Disease

## 2015-09-17 VITALS — BP 120/64 | HR 66 | Ht 69.0 in | Wt 199.5 lb

## 2015-09-17 DIAGNOSIS — I10 Essential (primary) hypertension: Secondary | ICD-10-CM | POA: Diagnosis not present

## 2015-09-17 DIAGNOSIS — I25119 Atherosclerotic heart disease of native coronary artery with unspecified angina pectoris: Secondary | ICD-10-CM

## 2015-09-17 DIAGNOSIS — E785 Hyperlipidemia, unspecified: Secondary | ICD-10-CM

## 2015-09-17 NOTE — Patient Instructions (Signed)
You are doing well. No medication changes were made.  Please call us if you have new issues that need to be addressed before your next appt.  Your physician wants you to follow-up in: 12 months.  You will receive a reminder letter in the mail two months in advance. If you don't receive a letter, please call our office to schedule the follow-up appointment. 
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
## 2015-09-17 NOTE — Assessment & Plan Note (Signed)
Currently with no symptoms of angina. No further workup at this time. Continue current medication regimen. 

## 2015-09-17 NOTE — Assessment & Plan Note (Signed)
Blood pressure is well controlled on today's visit. No changes made to the medications. 

## 2015-09-17 NOTE — Assessment & Plan Note (Signed)
Currently not on a cholesterol medication, cholesterol at very low level

## 2015-12-10 ENCOUNTER — Ambulatory Visit: Payer: No Typology Code available for payment source | Admitting: Internal Medicine

## 2015-12-18 ENCOUNTER — Ambulatory Visit (INDEPENDENT_AMBULATORY_CARE_PROVIDER_SITE_OTHER): Payer: Medicare Other | Admitting: Family

## 2015-12-18 ENCOUNTER — Encounter: Payer: Self-pay | Admitting: Family

## 2015-12-18 VITALS — Wt 201.8 lb

## 2015-12-18 DIAGNOSIS — F418 Other specified anxiety disorders: Secondary | ICD-10-CM

## 2015-12-18 DIAGNOSIS — I25119 Atherosclerotic heart disease of native coronary artery with unspecified angina pectoris: Secondary | ICD-10-CM

## 2015-12-18 DIAGNOSIS — I10 Essential (primary) hypertension: Secondary | ICD-10-CM

## 2015-12-18 DIAGNOSIS — F419 Anxiety disorder, unspecified: Principal | ICD-10-CM

## 2015-12-18 DIAGNOSIS — F329 Major depressive disorder, single episode, unspecified: Secondary | ICD-10-CM

## 2015-12-18 NOTE — Progress Notes (Addendum)
Subjective:    Patient ID: Allen David, male    DOB: 04-18-1928, 82 y.o.   MRN: [id number]  CC: Allen David is a 82 y.o. male who presents today for follow-up.  HPI: Patient presents for follow-up on chronic conditions . He is also here to establish care. No complaints today.   Hypertension. Follows with cardiology. States cardiologist checked lipid panel 09/2015 and it was normal.  Denies exertional chest pain or pressure, numbness or tingling radiating to left arm or jaw, palpitations, dizziness, frequent headaches, changes in vision, or shortness of breath.   Anxiety and depression well controlled. Takes xanax in the morning and half tablet TID. Anxiety started after fiance passed away a couple of years ago.   Staying active.   Veteran; rx through [place].      HISTORY:  Past Medical History:  Diagnosis Date  . Anxiety   . Back injury    with chronic pain  . CAD in native artery   . Chronic low back pain   . Depression   . ED (erectile dysfunction)   . Elevated PSA   . HLD (hyperlipidemia)   . HTN (hypertension)   . IBS (irritable bowel syndrome)   . Malaise and fatigue   . Pernicious anemia    Past Surgical History:  Procedure Laterality Date  . EYE SURGERY    . VASECTOMY     Family History  Problem Relation Age of Onset  . Stroke Mother   . Hypertension Mother   . Ulcers Mother   . Cancer Father     multiple myeloma  . Diabetes Sister   . Diabetes Brother   . Diabetes Brother     Allergies: Lexapro [escitalopram oxalate] and Tetanus toxoids Current Outpatient Prescriptions on File Prior to Visit  Medication Sig Dispense Refill  . Acetaminophen (TYLENOL ARTHRITIS PAIN PO) Take 1 tablet by mouth as needed.     . ALPRAZolam (XANAX) 1 MG tablet Take 1 mg by mouth 4 (four) times daily as needed for sleep.    Marland Kitchen amLODipine (NORVASC) 5 MG tablet Take 1 tablet (5 mg total) by mouth daily. 90 tablet 3  . aspirin (ASPIR-81) 81 MG EC tablet Take 162 mg by mouth  daily.      . Calcium-Magnesium-Zinc (CALCIUM-MAGNESUIUM-ZINC PO) Take 1 tablet by mouth daily.     Marland Kitchen FLUoxetine (PROZAC) 20 MG capsule Take 20 mg by mouth 3 (three) times daily.    Marland Kitchen gentamicin ointment (GARAMYCIN) 0.1 % Apply 1 application topically 3 (three) times daily. 15 g 0  . hydrOXYzine (ATARAX/VISTARIL) 10 MG tablet Take 1 tablet (10 mg total) by mouth at bedtime. 30 tablet 3  . Multiple Vitamin (ONE-A-DAY MENS PO) Take 1 tablet by mouth daily.     . vitamin B-12 (CYANOCOBALAMIN) 250 MCG tablet Take 250 mcg by mouth daily.     No current facility-administered medications on file prior to visit.     Social History  Substance Use Topics  . Smoking status: Former Smoker    Quit date: 05/19/1966  . Smokeless tobacco: Former Systems developer     Comment: Quit in 1968  . Alcohol use 0.0 - 0.6 oz/week     Comment: daily    Review of Systems  Constitutional: Negative for chills and fever.  HENT: Negative for congestion, ear pain, rhinorrhea, sinus pressure and sore throat.   Respiratory: Negative for cough, shortness of breath and wheezing.   Cardiovascular: Negative for chest pain and palpitations.  Gastrointestinal: Negative for diarrhea, nausea and vomiting.  Genitourinary: Negative for dysuria.  Musculoskeletal: Negative for myalgias.  Skin: Negative for rash.  Neurological: Negative for light-headedness and headaches.  Hematological: Negative for adenopathy.      Objective:    BP (P) 100/62   Pulse (P) 88   Wt 201 lb 12.8 oz (91.5 kg)   SpO2 (P) 94%   BMI 29.80 kg/m    Physical Exam  Constitutional: He appears well-developed and well-nourished.  Cardiovascular: Regular rhythm and normal heart sounds.   Pulmonary/Chest: Effort normal and breath sounds normal. No respiratory distress. He has no wheezes. He has no rhonchi. He has no rales.  Lymphadenopathy:       Head (left side): No submandibular and no preauricular adenopathy present.  Neurological: He is alert.  Skin: Skin  is warm and dry.  Psychiatric: He has a normal mood and affect. His speech is normal and behavior is normal.  Vitals reviewed.      Assessment & Plan:   Problem List Items Addressed This Visit      Cardiovascular and Mediastinum   Hypertension    Stable. On the lower end however patient asymptomatic. Advised patient to let us know if feels lightheaded, dizzy, or fatigue. Follows with cardiology.         Other   Anxiety and depression - Primary    Stable. Has been on Xanax for 10+ years. Lexapro in the past had raised BP.  Discussed tapering off of or decreasing dose of Xanax and patient declined. However after discussing QID 41m dosing with supervising MD, Allen David I will decrease dose to 3.5 times per day ( and continue to do this) and refer patient to psychiatry for further management.  Discussed risks of medication and patient agreed to consider until our next visit.       Other Visit Diagnoses   None.        I am having Mr. RQuentin Cornwallmaintain his aspirin, Multiple Vitamin (ONE-A-DAY MENS PO), Acetaminophen (TYLENOL ARTHRITIS PAIN PO), ALPRAZolam, vitamin B-12, gentamicin ointment, amLODipine, Calcium-Magnesium-Zinc (CALCIUM-MAGNESUIUM-ZINC PO), hydrOXYzine, FLUoxetine, and Clobetasol Prop Emollient Base.   Meds ordered this encounter  Medications  . Clobetasol Prop Emollient Base (CLOBETASOL PROPIONATE E) 0.05 % emollient cream    Sig: Apply 0.05 % topically 2 (two) times daily.    Return precautions given.   Risks, benefits, and alternatives of the medications and treatment plan prescribed today were discussed, and patient expressed understanding.   Education regarding symptom management and diagnosis given to patient on AVS.  Continue to follow with MMable Paris David for routine health maintenance.   OSkipper Clicheand I agreed with plan.   MMable Paris David Total of 25 minutes spent with patient, greater than 50% of which was spent in discussion of  depression, anxiety, and risks of BZDs.

## 2015-12-18 NOTE — Assessment & Plan Note (Addendum)
Stable. On the lower end however patient asymptomatic. Advised patient to let us know if feels lightheaded, dizzy, or fatigue. Follows with cardiology.

## 2015-12-18 NOTE — Assessment & Plan Note (Addendum)
Stable. Has been on Xanax for 10+ years. Lexapro in the past had raised BP.  Discussed tapering off of or decreasing dose of Xanax and patient declined. However after discussing QID 1mg  dosing with supervising MD, Dr. Derrel Nip, I will decrease dose to 3.5 times per day ( and continue to do this) and refer patient to psychiatry for further management.  Discussed risks of medication and patient agreed to consider until our next visit.

## 2015-12-18 NOTE — Patient Instructions (Addendum)
Pleasure meeting you today.   If there is no improvement in your symptoms, or if there is any worsening of symptoms, or if you have any additional concerns, please return for re-evaluation; or, if we are closed, consider going to the Emergency Room for evaluation if symptoms urgent.  Major Depressive Disorder Major depressive disorder is a mental illness. It also may be called clinical depression or unipolar depression. Major depressive disorder usually causes feelings of sadness, hopelessness, or helplessness. Some people with this disorder do not feel particularly sad but lose interest in doing things they used to enjoy (anhedonia). Major depressive disorder also can cause physical symptoms. It can interfere with work, school, relationships, and other normal everyday activities. The disorder varies in severity but is longer lasting and more serious than the sadness we all feel from time to time in our lives. Major depressive disorder often is triggered by stressful life events or major life changes. Examples of these triggers include divorce, loss of your job or home, a move, and the death of a family member or close friend. Sometimes this disorder occurs for no obvious reason at all. People who have family members with major depressive disorder or bipolar disorder are at higher risk for developing this disorder, with or without life stressors. Major depressive disorder can occur at any age. It may occur just once in your life (single episode major depressive disorder). It may occur multiple times (recurrent major depressive disorder). SYMPTOMS People with major depressive disorder have either anhedonia or depressed mood on nearly a daily basis for at least 2 weeks or longer. Symptoms of depressed mood include:  Feelings of sadness (blue or down in the dumps) or emptiness.  Feelings of hopelessness or helplessness.  Tearfulness or episodes of crying (may be observed by others).  Irritability  (children and adolescents). In addition to depressed mood or anhedonia or both, people with this disorder have at least four of the following symptoms:  Difficulty sleeping or sleeping too much.   Significant change (increase or decrease) in appetite or weight.   Lack of energy or motivation.  Feelings of guilt and worthlessness.   Difficulty concentrating, remembering, or making decisions.  Unusually slow movement (psychomotor retardation) or restlessness (as observed by others).   Recurrent wishes for death, recurrent thoughts of self-harm (suicide), or a suicide attempt. People with major depressive disorder commonly have persistent negative thoughts about themselves, other people, and the world. People with severe major depressive disorder may experiencedistorted beliefs or perceptions about the world (psychotic delusions). They also may see or hear things that are not real (psychotic hallucinations). DIAGNOSIS Major depressive disorder is diagnosed through an assessment by your health care provider. Your health care provider will ask aboutaspects of your daily life, such as mood,sleep, and appetite, to see if you have the diagnostic symptoms of major depressive disorder. Your health care provider may ask about your medical history and use of alcohol or drugs, including prescription medicines. Your health care provider also may do a physical exam and blood work. This is because certain medical conditions and the use of certain substances can cause major depressive disorder-like symptoms (secondary depression). Your health care provider also may refer you to a mental health specialist for further evaluation and treatment. TREATMENT It is important to recognize the symptoms of major depressive disorder and seek treatment. The following treatments can be prescribed for this disorder:   Medicine. Antidepressant medicines usually are prescribed. Antidepressant medicines are thought to  correct chemical imbalances  in the brain that are commonly associated with major depressive disorder. Other types of medicine may be added if the symptoms do not respond to antidepressant medicines alone or if psychotic delusions or hallucinations occur.  Talk therapy. Talk therapy can be helpful in treating major depressive disorder by providing support, education, and guidance. Certain types of talk therapy also can help with negative thinking (cognitive behavioral therapy) and with relationship issues that trigger this disorder (interpersonal therapy). A mental health specialist can help determine which treatment is best for you. Most people with major depressive disorder do well with a combination of medicine and talk therapy. Treatments involving electrical stimulation of the brain can be used in situations with extremely severe symptoms or when medicine and talk therapy do not work over time. These treatments include electroconvulsive therapy, transcranial magnetic stimulation, and vagal nerve stimulation.   This information is not intended to replace advice given to you by your health care provider. Make sure you discuss any questions you have with your health care provider.   Document Released: 08/30/2012 Document Revised: 05/26/2014 Document Reviewed: 08/30/2012 Elsevier Interactive Patient Education [employer].

## 2016-03-19 ENCOUNTER — Encounter: Payer: Self-pay | Admitting: Family

## 2016-03-19 ENCOUNTER — Ambulatory Visit: Payer: No Typology Code available for payment source | Admitting: Family

## 2016-03-19 ENCOUNTER — Ambulatory Visit (INDEPENDENT_AMBULATORY_CARE_PROVIDER_SITE_OTHER): Payer: Medicare Other | Admitting: Family

## 2016-03-19 VITALS — BP 132/78 | HR 70 | Temp 97.4°F | Ht 69.0 in | Wt 201.6 lb

## 2016-03-19 DIAGNOSIS — F419 Anxiety disorder, unspecified: Principal | ICD-10-CM

## 2016-03-19 DIAGNOSIS — F329 Major depressive disorder, single episode, unspecified: Secondary | ICD-10-CM

## 2016-03-19 DIAGNOSIS — F418 Other specified anxiety disorders: Secondary | ICD-10-CM | POA: Diagnosis not present

## 2016-03-19 DIAGNOSIS — I25119 Atherosclerotic heart disease of native coronary artery with unspecified angina pectoris: Secondary | ICD-10-CM | POA: Diagnosis not present

## 2016-03-19 NOTE — Patient Instructions (Addendum)
As discussed, I advise that you decrease your xanax to twice daily maximum.   I want you to be safe. Future refills will be for twice daily dosing and no more.   Look forward to seeing you again soon.

## 2016-03-19 NOTE — Progress Notes (Signed)
Pre visit review using our clinic review tool, if applicable. No additional management support is needed unless otherwise documented below in the visit note. 

## 2016-03-19 NOTE — Assessment & Plan Note (Addendum)
Patient and I discussed at great length my concerns with benzodiazepine dosed 4 times a day. In reality, patient's only taking half tab in the morning and a full tab at bedtime. We decided going forward if I was renew any prescriptions I would only right for twice stated daily dosing. He stated he also follows with the [place] doctor and he would discuss this with doctor as well. Declines referral to psychiatry or counseling.

## 2016-03-19 NOTE — Progress Notes (Signed)
Subjective:    Patient ID: Allen David, male    DOB: Aug 13, 1927, 82 y.o.   MRN: [id number]  CC: Allen David is a 82 y.o. male who presents today for follow up.   HPI: Follow-up for anxiety.   Anxiety- Triggered if someone stays something 'I don't like.' Using 2-3 half tablets per day. At about 12pm, takes whole tablet to go to bed. Not taking atarax. Takes prozac 20 mg TID and has been on it for 10-12 years. Trouble falling asleep due to anxiety as well as staying asleep. Used to have panic attacks, no longer. Denies exertional chest pain or pressure, numbness or tingling radiating to left arm or jaw, palpitations, dizziness, frequent headaches, changes in vision, or shortness of breath.   Follows with [place] doctor as well. Has seen psychiatry the past.        HISTORY:  Past Medical History:  Diagnosis Date  . Anxiety   . Back injury    with chronic pain  . CAD in native artery   . Chronic low back pain   . Depression   . ED (erectile dysfunction)   . Elevated PSA   . HLD (hyperlipidemia)   . HTN (hypertension)   . IBS (irritable bowel syndrome)   . Malaise and fatigue   . Pernicious anemia    Past Surgical History:  Procedure Laterality Date  . EYE SURGERY    . VASECTOMY     Family History  Problem Relation Age of Onset  . Stroke Mother   . Hypertension Mother   . Ulcers Mother   . Cancer Father     multiple myeloma  . Diabetes Sister   . Diabetes Brother   . Diabetes Brother     Allergies: Lexapro [escitalopram oxalate] and Tetanus toxoids Current Outpatient Prescriptions on File Prior to Visit  Medication Sig Dispense Refill  . Acetaminophen (TYLENOL ARTHRITIS PAIN PO) Take 1 tablet by mouth as needed.     . ALPRAZolam (XANAX) 1 MG tablet Take 1 mg by mouth 4 (four) times daily as needed for sleep.    Marland Kitchen amLODipine (NORVASC) 5 MG tablet Take 1 tablet (5 mg total) by mouth daily. 90 tablet 3  . aspirin (ASPIR-81) 81 MG EC tablet Take 162 mg by mouth  daily.      . Calcium-Magnesium-Zinc (CALCIUM-MAGNESUIUM-ZINC PO) Take 1 tablet by mouth daily.     . Clobetasol Prop Emollient Base (CLOBETASOL PROPIONATE E) 0.05 % emollient cream Apply 0.05 % topically 2 (two) times daily.    Marland Kitchen FLUoxetine (PROZAC) 20 MG capsule Take 20 mg by mouth 3 (three) times daily.    Marland Kitchen gentamicin ointment (GARAMYCIN) 0.1 % Apply 1 application topically 3 (three) times daily. 15 g 0  . hydrOXYzine (ATARAX/VISTARIL) 10 MG tablet Take 1 tablet (10 mg total) by mouth at bedtime. 30 tablet 3  . Multiple Vitamin (ONE-A-DAY MENS PO) Take 1 tablet by mouth daily.     . vitamin B-12 (CYANOCOBALAMIN) 250 MCG tablet Take 250 mcg by mouth daily.     No current facility-administered medications on file prior to visit.     Social History  Substance Use Topics  . Smoking status: Former Smoker    Quit date: 05/19/1966  . Smokeless tobacco: Former Systems developer     Comment: Quit in 1968  . Alcohol use 0.0 - 0.6 oz/week     Comment: daily    Review of Systems  Constitutional: Negative for chills and fever.  Respiratory: Negative for cough.   Cardiovascular: Negative for chest pain and palpitations.  Gastrointestinal: Negative for nausea and vomiting.  Psychiatric/Behavioral: Positive for sleep disturbance. The patient is nervous/anxious.       Objective:    BP 132/78   Pulse 70   Temp 97.4 F (36.3 C) (Oral)   Ht _0  (1.753 m)   Wt 201 lb 9.6 oz (91.4 kg)   SpO2 97%   BMI 29.77 kg/m  BP Readings from Last 3 Encounters:  03/19/16 132/78  12/18/15 (P) 100/62  09/17/15 120/64   Wt Readings from Last 3 Encounters:  03/19/16 201 lb 9.6 oz (91.4 kg)  12/18/15 201 lb 12.8 oz (91.5 kg)  09/17/15 199 lb 8 oz (90.5 kg)    Physical Exam  Constitutional: He appears well-developed and well-nourished.  Cardiovascular: Regular rhythm and normal heart sounds.   Pulmonary/Chest: Effort normal and breath sounds normal. No respiratory distress. He has no wheezes. He has no rhonchi.  He has no rales.  Neurological: He is alert.  Skin: Skin is warm and dry.  Psychiatric: He has a normal mood and affect. His speech is normal and behavior is normal.  Vitals reviewed.      Assessment & Plan:   Problem List Items Addressed This Visit      Other   Anxiety and depression - Primary    Patient and I discussed at great length my concerns with benzodiazepine dosed 4 times a day. In reality, patient's only taking half tab in the morning and a full tab at bedtime. We decided going forward if I was renew any prescriptions I would only right for twice stated daily dosing. He stated he also follows with the [place] doctor and he would discuss this with doctor as well.        Other Visit Diagnoses   None.      I am having [name] maintain his aspirin, Multiple Vitamin (ONE-A-DAY MENS PO), Acetaminophen (TYLENOL ARTHRITIS PAIN PO), ALPRAZolam, vitamin B-12, gentamicin ointment, amLODipine, Calcium-Magnesium-Zinc (CALCIUM-MAGNESUIUM-ZINC PO), hydrOXYzine, FLUoxetine, and Clobetasol Prop Emollient Base.   No orders of the defined types were placed in this encounter.   Return precautions given.   Risks, benefits, and alternatives of the medications and treatment plan prescribed today were discussed, and patient expressed understanding.   Education regarding symptom management and diagnosis given to patient on AVS.  Continue to follow with Mable Paris, FNP for routine health maintenance.   Skipper Cliche and I agreed with plan.   Mable Paris, FNP  Total of 25 minutes spent with patient, greater than 50% of which was spent in discussion of  BZDs and anxiety.

## 2016-03-26 ENCOUNTER — Ambulatory Visit: Payer: No Typology Code available for payment source | Admitting: Family
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
## 2016-09-16 ENCOUNTER — Ambulatory Visit (INDEPENDENT_AMBULATORY_CARE_PROVIDER_SITE_OTHER): Payer: Medicare Other | Admitting: Cardiovascular Disease

## 2016-09-16 ENCOUNTER — Encounter: Payer: Self-pay | Admitting: Cardiovascular Disease

## 2016-09-16 VITALS — BP 122/60 | HR 72 | Ht 67.0 in | Wt 203.5 lb

## 2016-09-16 DIAGNOSIS — F419 Anxiety disorder, unspecified: Secondary | ICD-10-CM

## 2016-09-16 DIAGNOSIS — E782 Mixed hyperlipidemia: Secondary | ICD-10-CM | POA: Diagnosis not present

## 2016-09-16 DIAGNOSIS — I10 Essential (primary) hypertension: Secondary | ICD-10-CM | POA: Diagnosis not present

## 2016-09-16 DIAGNOSIS — F329 Major depressive disorder, single episode, unspecified: Secondary | ICD-10-CM

## 2016-09-16 NOTE — Patient Instructions (Addendum)
Ask for Dr. Josephina Gip  Medication Instructions:   No medication changes made  Labwork:  No new labs needed  Testing/Procedures:  No further testing at this time   I recommend watching educational videos on topics of interest to you at:       [email or web address]  Enter code: [email or web address]    Follow-Up: It was a pleasure seeing you in the office today. Please call us if you have new issues that need to be addressed before your next appt.  [phone]  Your physician wants you to follow-up in: 12 months as needed  You will receive a reminder letter in the mail two months in advance. If you don't receive a letter, please call our office to schedule the follow-up appointment.  If you need a refill on your cardiac medications before your next appointment, please call your pharmacy.

## 2016-09-18 ENCOUNTER — Ambulatory Visit: Payer: No Typology Code available for payment source | Admitting: Family

## 2016-09-24 ENCOUNTER — Telehealth: Payer: Self-pay | Admitting: Family Medicine

## 2016-09-24 NOTE — Telephone Encounter (Signed)
Left pt message asking to call Allison back directly at [phone] to schedule AWV. Thanks! °

## 2016-10-23 NOTE — Telephone Encounter (Signed)
Scheduled 11/17/16

## 2016-10-30 ENCOUNTER — Ambulatory Visit: Payer: No Typology Code available for payment source | Admitting: Family Medicine

## 2016-11-04 ENCOUNTER — Ambulatory Visit (INDEPENDENT_AMBULATORY_CARE_PROVIDER_SITE_OTHER): Payer: Medicare Other | Admitting: Family Medicine

## 2016-11-04 ENCOUNTER — Encounter: Payer: Self-pay | Admitting: Family Medicine

## 2016-11-04 VITALS — BP 140/84 | HR 77 | Temp 97.7°F | Wt 188.6 lb

## 2016-11-04 DIAGNOSIS — E538 Deficiency of other specified B group vitamins: Secondary | ICD-10-CM

## 2016-11-04 DIAGNOSIS — K458 Other specified abdominal hernia without obstruction or gangrene: Secondary | ICD-10-CM | POA: Diagnosis not present

## 2016-11-04 DIAGNOSIS — Z1329 Encounter for screening for other suspected endocrine disorder: Secondary | ICD-10-CM

## 2016-11-04 DIAGNOSIS — M79671 Pain in right foot: Secondary | ICD-10-CM | POA: Diagnosis not present

## 2016-11-04 LAB — COMPREHENSIVE METABOLIC PANEL
ALBUMIN: 4.5 g/dL (ref 3.5–5.2)
ALT: 21 U/L (ref 0–53)
AST: 27 U/L (ref 0–37)
Alkaline Phosphatase: 38 U/L — ABNORMAL LOW (ref 39–117)
BUN: 29 mg/dL — ABNORMAL HIGH (ref 6–23)
CALCIUM: 9.9 mg/dL (ref 8.4–10.5)
CHLORIDE: 105 meq/L (ref 96–112)
CO2: 24 mEq/L (ref 19–32)
CREATININE: 1.26 mg/dL (ref 0.40–1.50)
GFR: 57.49 mL/min — AB (ref >60.00)
Glucose, Bld: 96 mg/dL (ref 70–99)
Potassium: 4.5 mEq/L (ref 3.5–5.1)
Sodium: 137 mEq/L (ref 135–145)
Total Bilirubin: 0.8 mg/dL (ref 0.2–1.2)
Total Protein: 7.3 g/dL (ref 6.0–8.3)

## 2016-11-04 LAB — CBC
HCT: 37.2 % — ABNORMAL LOW (ref 39.0–52.0)
Hemoglobin: 12.6 g/dL — ABNORMAL LOW (ref 13.0–17.0)
MCHC: 33.9 g/dL (ref 30.0–36.0)
MCV: 96.7 fl (ref 78.0–100.0)
Platelets: 175 10*3/uL (ref 150.0–400.0)
RBC: 3.85 Mil/uL — AB (ref 4.22–5.81)
RDW: 14.7 % (ref 11.5–15.5)
WBC: 5.3 10*3/uL (ref 4.0–10.5)

## 2016-11-04 LAB — VITAMIN B12

## 2016-11-04 NOTE — Patient Instructions (Signed)
Nice to see you. We'll get a CT of your abdomen and pelvis to evaluate for hernia. We'll refer you to podiatry. You can continue to use a cotton ball. You can continue icy hot. We'll get some lab work today and contact you with the results.

## 2016-11-04 NOTE — Assessment & Plan Note (Signed)
Pre-benign exam. Minimal tenderness of the right fourth toe. Could be nerve impingement given handout he describes the stabbing sensation. We'll refer to podiatry for evaluation. He'll continue to place a cotton ball between his third and fourth toes.

## 2016-11-04 NOTE — Progress Notes (Signed)
[name], MD Phone: [phone]  Allen David is a 83 y.o. male who presents today for same-day visit.  Patient notes right foot pain for about the last month. Notes this is stabbing pain into his fourth toe. Has no numbness or weakness. No injury. Has been getting slightly worse. Improves by placing cotton ball between his third and fourth toes. Icy hot also helps.  Patient also notes a bulge along his right flank. It has intermittently been there for some time. Has alternating diarrhea and constipation though does have a bowel movement daily. There is no pain. No prior surgeries. It always reduces.  PMH: Former smoker   ROS see history of present illness  Objective  Physical Exam Vitals:   11/04/16 1319  BP: 140/84  Pulse: 77  Temp: 97.7 F (36.5 C)    BP Readings from Last 3 Encounters:  11/04/16 140/84  09/16/16 122/60  03/19/16 132/78   Wt Readings from Last 3 Encounters:  11/04/16 188 lb 9.6 oz (85.5 kg)  09/16/16 203 lb 8 oz (92.3 kg)  03/19/16 201 lb 9.6 oz (91.4 kg)    Physical Exam  Constitutional: He is well-developed, well-nourished, and in no distress.  Cardiovascular: Normal rate, regular rhythm and normal heart sounds.   Pulmonary/Chest: Effort normal and breath sounds normal.  Abdominal: Soft. Bowel sounds are normal. He exhibits no distension. There is no tenderness. There is no rebound and no guarding.  Prominent area when he stands up in the right lateral abdomen and right flank, palpation there is no palpable defect of the abdominal wall, there is no hernia, there is no presence of this when he lays down  Musculoskeletal:  Minimal tenderness of right fourth toe, no skin changes, no skin breakdown, no deformity, no tenderness of the rest of his foot, good capillary refill fourth toe, sensation to light touch intact bilateral feet, no ankle tenderness, no tenderness of the navicular or fifth metatarsal  Neurological: He is alert. Gait normal.      Assessment/Plan: Please see individual problem list.  Right foot pain Pre-benign exam. Minimal tenderness of the right fourth toe. Could be nerve impingement given handout he describes the stabbing sensation. We'll refer to podiatry for evaluation. He'll continue to place a cotton ball between his third and fourth toes.  Other specified abdominal hernia without obstruction or gangrene Potential hernia. No palpable defects. No tenderness. No signs of obstruction. Fully reduces if it is a hernia. Discussed monitoring given that it is not providing any symptoms versus obtaining a CT scan to definitely define what the issue is. He opted for CT scan. This was ordered. Given hernia return precautions.   Orders Placed This Encounter  Procedures  . CT Abdomen Pelvis W Contrast    Standing Status:   Future    Standing Expiration Date:   02/04/2018    Order Specific Question:   If indicated for the ordered procedure, I authorize the administration of contrast media per Radiology protocol    Answer:   Yes    Order Specific Question:   Reason for Exam (SYMPTOM  OR DIAGNOSIS REQUIRED)    Answer:   right flank bulging, concern for hernia    Order Specific Question:   Preferred imaging location?    Answer:   [facility]    Order Specific Question:   Radiology Contrast Protocol - do NOT remove file path    Answer:   [email or web address]  . Comp Met (CMET)  . B12  .  CBC  . Ambulatory referral to Podiatry    Referral Priority:   Routine    Referral Type:   Consultation    Referral Reason:   Specialty Services Required    Requested Specialty:   Podiatry    Number of Visits Requested:   1   Labs ordered in advance of his next visit.  [name], MD [place]

## 2016-11-04 NOTE — Assessment & Plan Note (Signed)
Potential hernia. No palpable defects. No tenderness. No signs of obstruction. Fully reduces if it is a hernia. Discussed monitoring given that it is not providing any symptoms versus obtaining a CT scan to definitely define what the issue is. He opted for CT scan. This was ordered. Given hernia return precautions.

## 2016-11-05 ENCOUNTER — Other Ambulatory Visit: Payer: Self-pay | Admitting: Family Medicine

## 2016-11-05 DIAGNOSIS — D649 Anemia, unspecified: Secondary | ICD-10-CM

## 2016-11-05 DIAGNOSIS — R748 Abnormal levels of other serum enzymes: Secondary | ICD-10-CM

## 2016-11-05 DIAGNOSIS — N179 Acute kidney failure, unspecified: Secondary | ICD-10-CM

## 2016-11-05 DIAGNOSIS — E538 Deficiency of other specified B group vitamins: Secondary | ICD-10-CM

## 2016-11-05 DIAGNOSIS — D510 Vitamin B12 deficiency anemia due to intrinsic factor deficiency: Secondary | ICD-10-CM

## 2016-11-06 ENCOUNTER — Ambulatory Visit: Payer: Medicare Other | Admitting: Family Medicine

## 2016-11-17 ENCOUNTER — Ambulatory Visit (INDEPENDENT_AMBULATORY_CARE_PROVIDER_SITE_OTHER): Payer: Medicare Other | Admitting: Family Medicine

## 2016-11-17 ENCOUNTER — Encounter: Payer: Self-pay | Admitting: Family Medicine

## 2016-11-17 VITALS — BP 120/68 | HR 64 | Temp 98.0°F | Resp 14 | Ht 69.0 in | Wt 187.0 lb

## 2016-11-17 DIAGNOSIS — I10 Essential (primary) hypertension: Secondary | ICD-10-CM

## 2016-11-17 DIAGNOSIS — Z0000 Encounter for general adult medical examination without abnormal findings: Secondary | ICD-10-CM

## 2016-11-17 DIAGNOSIS — F419 Anxiety disorder, unspecified: Secondary | ICD-10-CM

## 2016-11-17 DIAGNOSIS — F329 Major depressive disorder, single episode, unspecified: Secondary | ICD-10-CM | POA: Diagnosis not present

## 2016-11-17 NOTE — Patient Instructions (Addendum)
  [name] , Thank you for taking time to come for your Medicare Wellness Visit. I appreciate your ongoing commitment to your health goals. Please review the following plan we discussed and let me know if I can assist you in the future.   Follow up with Dr. Caryl Bis as needed.    Bring a copy of your [place] and/or Living Will to be scanned into chart once completed.  Have a great day!  These are the goals we discussed: Goals    . Increase water intake          Stay hydrated and drink plenty of fluids/water.       This is a list of the screening recommended for you and due dates:  Health Maintenance  Topic Date Due  . Flu Shot  02/07/2017*  . Pneumonia vaccines  Completed  . Tetanus Vaccine  Excluded  *Topic was postponed. The date shown is not the original due date.       Nice to see you. Work and refer you to a therapist. Please contact your cardiologist as we discussed. I did discuss stopping your amlodipine given that your blood pressures do run in the low normal range even when you don't take it. If they agree you can stop this and we can plan on rechecking in a week or 2.

## 2016-11-17 NOTE — Progress Notes (Signed)
  [name], MD Phone: [phone]  Allen David is a 83 y.o. male who presents today for follow-up.  Anxiety/depression: Patient notes this is slightly worse. He is unsure if the Prozac is still keeping him quite as calm as it used to. He takes half a milligram of Xanax up to 4 times a day when he is feeling panicky. He does note over the last 6-9 months he'll start feeling anxious and then feel a cold sweat in his legs and feel jittery. He takes half a milligram of Xanax and this goes away. No SI.  Hypertension: Notes systolic blood pressures typically between 104 and 134. Diastolically in the 97Q. Notes he does not take amlodipine if his blood pressure is less than 734 systolically. Notes only taking the amlodipine 2-3 times a week. No chest pain, shortness breath, edema, or lightheadedness.  PMH: Former smoker   ROS see history of present illness  Objective  Physical Exam Vitals:   11/17/16 0811  BP: 120/68  Pulse: 64  Resp: 14  Temp: 98 F (36.7 C)    BP Readings from Last 3 Encounters:  11/17/16 120/68  11/04/16 140/84  09/16/16 122/60   Wt Readings from Last 3 Encounters:  11/17/16 187 lb (84.8 kg)  11/04/16 188 lb 9.6 oz (85.5 kg)  09/16/16 203 lb 8 oz (92.3 kg)    Physical Exam  Constitutional: No distress.  HENT:  Head: Normocephalic and atraumatic.  Mouth/Throat: Oropharynx is clear and moist.  Cardiovascular: Normal rate, regular rhythm and normal heart sounds.   Pulmonary/Chest: Effort normal and breath sounds normal.  Musculoskeletal: He exhibits no edema.  Neurological: He is alert. Gait normal.  Skin: He is not diaphoretic.  Psychiatric:  Mood anxious, affect mildly anxious     Assessment/Plan: Please see individual problem list.  Hypertension At goal. I discussed if he is only taking the amlodipine 2-3 times a week because his blood pressures typically 193 systolically or less he may not need the blood pressure medicine. He wanted to  check with his cardiologist to see what they thought. We can plan on rechecking in 1-2 weeks if he comes off of the medication after talking to his cardiologist.  Anxiety and depression Patient with worsening symptoms of anxiety with possible anxiety attacks and also slight worsening of his depression. He will continue his Prozac. There are days where he does not take his Xanax and some days where he takes it up to 4 times a day depending on if he is having anxiety attacks. Discussed options for treatment including changing Prozac to a different medicine, adding an additional medicine to the Prozac, or seeing a therapist. We opted to see a therapist. Could consider addition of Wellbutrin for the depression.   Orders Placed This Encounter  Procedures  . Ambulatory referral to Psychology    Referral Priority:   Routine    Referral Type:   Psychiatric    Referral Reason:   Specialty Services Required    Requested Specialty:   Psychology    Number of Visits Requested:   [place], MD [place]

## 2016-11-17 NOTE — Progress Notes (Signed)
Subjective:   Allen David is a 83 y.o. male who presents for Medicare Annual/Subsequent preventive examination.  Review of Systems:  No ROS.  Medicare Wellness Visit. Additional risk factors are reflected in the social history.  Cardiac Risk Factors include: advanced age (>2mn, >>63women);male gender;hypertension     Objective:    Vitals: BP 120/68 (BP Location: Left Arm, Patient Position: Sitting, Cuff Size: Normal)   Pulse 64   Temp 98 F (36.7 C) (Oral)   Resp 14   Ht _0  (1.753 m)   Wt 187 lb (84.8 kg)   SpO2 98%   BMI 27.62 kg/m   Body mass index is 27.62 kg/m.  Tobacco History  Smoking Status  . Former Smoker  . Quit date: 05/19/1966  Smokeless Tobacco  . Former [employer] developer   Comment: Quit in 1[place]: Not Answered   Past Medical History:  Diagnosis Date  . Anxiety   . Back injury    with chronic pain  . CAD in native artery   . Chronic low back pain   . Depression   . ED (erectile dysfunction)   . Elevated PSA   . HLD (hyperlipidemia)   . HTN (hypertension)   . IBS (irritable bowel syndrome)   . Malaise and fatigue   . Pernicious anemia    Past Surgical History:  Procedure Laterality Date  . EYE SURGERY    . VASECTOMY     Family History  Problem Relation Age of Onset  . Stroke Mother   . Hypertension Mother   . Ulcers Mother   . Cancer Father        multiple myeloma  . Diabetes Sister   . Diabetes Brother   . Diabetes Brother    History  Sexual Activity  . Sexual activity: Not Currently    Outpatient Encounter Prescriptions as of 11/17/2016  Medication Sig  . Acetaminophen (TYLENOL ARTHRITIS PAIN PO) Take 1 tablet by mouth as needed.   . ALPRAZolam (XANAX) 1 MG tablet Take 1 mg by mouth 4 (four) times daily as needed for sleep.  .Marland KitchenamLODipine (NORVASC) 5 MG tablet Take 1 tablet (5 mg total) by mouth daily.  .Marland Kitchenaspirin (ASPIR-81) 81 MG EC tablet Take 162 mg by mouth daily.    . Calcium-Magnesium-Zinc  (CALCIUM-MAGNESUIUM-ZINC PO) Take 1 tablet by mouth daily.   . Clobetasol Prop Emollient Base (CLOBETASOL PROPIONATE E) 0.05 % emollient cream Apply 0.05 % topically 2 (two) times daily.  .Marland KitchenFLUoxetine (PROZAC) 20 MG capsule Take 20 mg by mouth 2 (two) times daily.   . hydrOXYzine (ATARAX/VISTARIL) 10 MG tablet Take 1 tablet (10 mg total) by mouth at bedtime. (Patient taking differently: Take 20 mg by mouth as needed. )  . Multiple Vitamin (ONE-A-DAY MENS PO) Take 1 tablet by mouth daily.   . [DISCONTINUED] vitamin B-12 (CYANOCOBALAMIN) 250 MCG tablet Take 250 mcg by mouth daily.  . [DISCONTINUED] gentamicin ointment (GARAMYCIN) 0.1 % Apply 1 application topically 3 (three) times daily.   No facility-administered encounter medications on file as of 11/17/2016.     Activities of Daily Living In your present state of health, do you have any difficulty performing the following activities: 11/17/2016  Hearing? N  Vision? N  Difficulty concentrating or making decisions? N  Walking or climbing stairs? Y  Dressing or bathing? N  Doing errands, shopping? N  Preparing Food and eating ? N  Using the Toilet? N  In  the past six months, have you accidently leaked urine? N  Do you have problems with loss of bowel control? N  Managing your Medications? N  Managing your Finances? N  Housekeeping or managing your Housekeeping? N  Some recent data might be hidden    Patient Care Team: Leone [place], MD as PCP - General (Family Medicine) Minna Merritts, MD as Consulting Physician (Cardiology)   Assessment:    This is a routine wellness examination for [place]. The goal of the wellness visit is to assist the patient how to close the gaps in care and create a preventative care plan for the patient.   The roster of all physicians providing medical care to patient is listed in the Snapshot section of the chart.  Taking calcium as appropriate/Osteoporosis risk reviewed.    Safety issues reviewed;  daughter lives with him.  Smoke and carbon monoxide detectors in the home. No firearms in the home.  Wears seatbelts when driving or riding with others. Patient does wear sunscreen or protective clothing when in direct sunlight. No violence in the home.  Patient is alert, normal appearance, oriented to person/place/and time.  Correctly identified the president of the [place], recall of 2/3 words, and performing simple calculations. Displays appropriate judgement and can read correct time from watch face.   No new identified risk were noted.  No failures at ADL's or IADL's.    BMI- discussed the importance of a healthy diet, water intake and the benefits of aerobic exercise. Educational material provided.   24 hour diet recall: Breakfast: grits, bacon Lunch: sandwich Dinner: [employer], green vegetable, pasta Snack: none Daily fluid intake: 2 cups of caffeine, 4 cups of water, 2 cups of gatorade  Dental- visits every 6 months.  Dr. Henrene Pastor.  Eye- Visual acuity not assessed per patient preference since they have regular follow up with the ophthalmologist.  Wears corrective lenses.  Sleep patterns- Sleeps 6 hours at night.  Wakes feeling rested.  TDAP vaccine excluded from health maintenance per patient preference.  Past reaction of swelling of the eyes/face.     Patient Concerns: None at this time. Follow up with PCP as needed.  Exercise Activities and Dietary recommendations  Current Exercise Habits: Home exercise routine (Active around the home), Type of exercise: walking, Time (Minutes): 15, Frequency (Times/Week): 5, Weekly Exercise (Minutes/Week): 75, Intensity: Mild  Goals    . Increase water intake          Stay hydrated and drink plenty of fluids/water.      Fall Risk Fall Risk  11/17/2016 03/19/2016 12/20/2013 12/08/2012  Falls in the past year? No No No No   Depression Screen PHQ 2/9 Scores 03/19/2016 12/20/2013 12/08/2012  PHQ - 2 Score 0 0 2  PHQ- 9 Score - - 3     Cognitive Function MMSE - Mini Mental State Exam 11/17/2016  Orientation to time 5  Orientation to Place 5  Registration 3  Attention/ Calculation 5  Recall 3  Language- name 2 objects 2  Language- repeat 1  Language- follow 3 step command 3  Language- read & follow direction 1  Write a sentence 1  Copy design 1  Total score 30        Immunization History  Administered Date(s) Administered  . Influenza Split 02/16/2014  . Influenza-Unspecified 03/03/2012, 03/29/2013  . Pneumococcal Conjugate-13 02/16/2014  . Pneumococcal Polysaccharide-23 06/03/2008  . Zoster 06/03/2010   Screening Tests Health Maintenance  Topic Date Due  . INFLUENZA  VACCINE  02/07/2017 (Originally 12/17/2016)  . PNA vac Low Risk Adult  Completed  . TETANUS/TDAP  Excluded      Plan:   End of life planning; Advanced aging; Advanced directives discussed.  No HCPOA/Living Will.  Additional information provided to help them start the conversation with family.  Copy of HCPOA/Living Will requested upon completion. Time spent on this topic is 17 minutes.  I have personally reviewed and noted the following in the patient's chart:   . Medical and social history . Use of alcohol, tobacco or illicit drugs  . Current medications and supplements . Functional ability and status . Nutritional status . Physical activity . Advanced directives . List of other physicians . Hospitalizations, surgeries, and ER visits in previous 12 months . Vitals . Screenings to include cognitive, depression, and falls . Referrals and appointments  In addition, I have reviewed and discussed with patient certain preventive protocols, quality metrics, and best practice recommendations. A written personalized care plan for preventive services as well as general preventive health recommendations were provided to patient.     Varney Biles, LPN  01/24/9479

## 2016-11-17 NOTE — Assessment & Plan Note (Signed)
Patient with worsening symptoms of anxiety with possible anxiety attacks and also slight worsening of his depression. He will continue his Prozac. There are days where he does not take his Xanax and some days where he takes it up to 4 times a day depending on if he is having anxiety attacks. Discussed options for treatment including changing Prozac to a different medicine, adding an additional medicine to the Prozac, or seeing a therapist. We opted to see a therapist. Could consider addition of Wellbutrin for the depression.

## 2016-11-17 NOTE — Assessment & Plan Note (Signed)
At goal. I discussed if he is only taking the amlodipine 2-3 times a week because his blood pressures typically 366 systolically or less he may not need the blood pressure medicine. He wanted to check with his cardiologist to see what they thought. We can plan on rechecking in 1-2 weeks if he comes off of the medication after talking to his cardiologist.

## 2016-11-18 ENCOUNTER — Ambulatory Visit
Admission: RE | Admit: 2016-11-18 | Discharge: 2016-11-18 | Disposition: A | Discharge status: Home / Self Care | Payer: Medicare Other | Source: Ambulatory Visit | Attending: Family Medicine | Admitting: Family Medicine

## 2016-11-18 DIAGNOSIS — K5730 Diverticulosis of large intestine without perforation or abscess without bleeding: Secondary | ICD-10-CM | POA: Diagnosis not present

## 2016-11-18 DIAGNOSIS — K760 Fatty (change of) liver, not elsewhere classified: Secondary | ICD-10-CM | POA: Diagnosis not present

## 2016-11-18 DIAGNOSIS — I708 Atherosclerosis of other arteries: Secondary | ICD-10-CM | POA: Insufficient documentation

## 2016-11-18 DIAGNOSIS — N281 Cyst of kidney, acquired: Secondary | ICD-10-CM | POA: Diagnosis not present

## 2016-11-18 DIAGNOSIS — D3502 Benign neoplasm of left adrenal gland: Secondary | ICD-10-CM | POA: Insufficient documentation

## 2016-11-18 DIAGNOSIS — K458 Other specified abdominal hernia without obstruction or gangrene: Secondary | ICD-10-CM | POA: Diagnosis not present

## 2016-11-18 DIAGNOSIS — I700 Atherosclerosis of aorta: Secondary | ICD-10-CM | POA: Insufficient documentation

## 2016-11-18 DIAGNOSIS — D3500 Benign neoplasm of unspecified adrenal gland: Secondary | ICD-10-CM | POA: Diagnosis not present

## 2016-11-18 DIAGNOSIS — R918 Other nonspecific abnormal finding of lung field: Secondary | ICD-10-CM | POA: Insufficient documentation

## 2016-11-18 MED ORDER — IOPAMIDOL (ISOVUE-300) INJECTION 61%
85.0000 mL | Freq: Once | INTRAVENOUS | Status: AC | PRN
  Administered 2016-11-18: 85 mL via INTRAVENOUS

## 2016-11-20 ENCOUNTER — Other Ambulatory Visit: Payer: Self-pay | Admitting: Family Medicine

## 2016-11-20 DIAGNOSIS — E278 Other specified disorders of adrenal gland: Secondary | ICD-10-CM

## 2016-11-21 ENCOUNTER — Other Ambulatory Visit: Payer: Self-pay | Admitting: Family Medicine

## 2016-11-21 ENCOUNTER — Other Ambulatory Visit: Payer: Medicare Other

## 2016-11-21 MED ORDER — DEXAMETHASONE 1 MG PO TABS: 1.0000 mg | ORAL_TABLET | Freq: Once | ORAL | 0 refills | Status: AC

## 2016-11-24 ENCOUNTER — Other Ambulatory Visit (INDEPENDENT_AMBULATORY_CARE_PROVIDER_SITE_OTHER): Payer: Medicare Other

## 2016-11-24 DIAGNOSIS — D510 Vitamin B12 deficiency anemia due to intrinsic factor deficiency: Secondary | ICD-10-CM | POA: Diagnosis not present

## 2016-11-24 DIAGNOSIS — N179 Acute kidney failure, unspecified: Secondary | ICD-10-CM

## 2016-11-24 DIAGNOSIS — E278 Other specified disorders of adrenal gland: Secondary | ICD-10-CM

## 2016-11-24 DIAGNOSIS — D649 Anemia, unspecified: Secondary | ICD-10-CM | POA: Diagnosis not present

## 2016-11-24 LAB — BASIC METABOLIC PANEL
BUN: 18 mg/dL (ref 6–23)
CALCIUM: 9.4 mg/dL (ref 8.4–10.5)
CO2: 24 mEq/L (ref 19–32)
Chloride: 107 mEq/L (ref 96–112)
Creatinine, Ser: 1.1 mg/dL (ref 0.40–1.50)
GFR: 67.24 mL/min (ref >60.00)
Glucose, Bld: 102 mg/dL — ABNORMAL HIGH (ref 70–99)
Potassium: 5.8 mEq/L — ABNORMAL HIGH (ref 3.5–5.1)
SODIUM: 138 meq/L (ref 135–145)

## 2016-11-24 LAB — CORTISOL: CORTISOL PLASMA: 1.4 ug/dL

## 2016-11-24 LAB — CBC
HCT: 36.2 % — ABNORMAL LOW (ref 39.0–52.0)
Hemoglobin: 12.2 g/dL — ABNORMAL LOW (ref 13.0–17.0)
MCHC: 33.7 g/dL (ref 30.0–36.0)
MCV: 96.5 fl (ref 78.0–100.0)
PLATELETS: 168 10*3/uL (ref 150.0–400.0)
RBC: 3.75 Mil/uL — AB (ref 4.22–5.81)
RDW: 14.8 % (ref 11.5–15.5)
WBC: 5.7 10*3/uL (ref 4.0–10.5)

## 2016-11-24 LAB — VITAMIN B12: Vitamin B-12: 1017 pg/mL — ABNORMAL HIGH (ref 211–911)

## 2016-11-25 ENCOUNTER — Telehealth: Payer: Self-pay | Admitting: Family Medicine

## 2016-11-25 ENCOUNTER — Other Ambulatory Visit: Payer: Self-pay | Admitting: Family Medicine

## 2016-11-25 ENCOUNTER — Other Ambulatory Visit (INDEPENDENT_AMBULATORY_CARE_PROVIDER_SITE_OTHER): Payer: Medicare Other

## 2016-11-25 DIAGNOSIS — E875 Hyperkalemia: Secondary | ICD-10-CM

## 2016-11-25 DIAGNOSIS — E278 Other specified disorders of adrenal gland: Secondary | ICD-10-CM | POA: Diagnosis not present

## 2016-11-25 LAB — POTASSIUM: Potassium: 4 mEq/L (ref 3.5–5.1)

## 2016-11-25 NOTE — Telephone Encounter (Signed)
Left message to return call 

## 2016-11-25 NOTE — Telephone Encounter (Signed)
Pt daughter [name] called and wanted Dr. Caryl Bis to know that the patient drinks gatorade all day. Would this raise his potassium level?  Call [facility] @ [phone]

## 2016-11-25 NOTE — Telephone Encounter (Signed)
Patient's daughter notified.

## 2016-11-25 NOTE — Telephone Encounter (Signed)
This potentially could raise his potassium. He could decrease this and add water in its place. We will still plan on checking today.

## 2016-11-25 NOTE — Telephone Encounter (Signed)
Please advise 

## 2016-11-26 ENCOUNTER — Telehealth: Payer: Self-pay | Admitting: Family Medicine

## 2016-11-26 NOTE — Telephone Encounter (Signed)
Pt daughter called asking that you call her in regards to labs. Please advise, thank you!  Call [facility] @ [phone]

## 2016-11-26 NOTE — Telephone Encounter (Signed)
Patient notified of lab results

## 2016-11-28 ENCOUNTER — Encounter: Payer: Self-pay | Admitting: Podiatry

## 2016-11-28 ENCOUNTER — Ambulatory Visit (INDEPENDENT_AMBULATORY_CARE_PROVIDER_SITE_OTHER): Payer: Medicare Other | Admitting: Podiatry

## 2016-11-28 ENCOUNTER — Ambulatory Visit (INDEPENDENT_AMBULATORY_CARE_PROVIDER_SITE_OTHER): Payer: Medicare Other

## 2016-11-28 VITALS — BP 136/75 | HR 59 | Temp 98.2°F | Resp 18

## 2016-11-28 DIAGNOSIS — M79671 Pain in right foot: Secondary | ICD-10-CM

## 2016-11-28 DIAGNOSIS — G5761 Lesion of plantar nerve, right lower limb: Secondary | ICD-10-CM

## 2016-11-28 DIAGNOSIS — G5781 Other specified mononeuropathies of right lower limb: Secondary | ICD-10-CM

## 2016-11-28 LAB — CATECHOLAMINES, FRACTIONATED, URINE, 24 HOUR
CALCULATED TOTAL (E+ NE): 24 ug/(24.h) — AB (ref 26–121)
CREATININE, URINE MG/DAY-CATEUR: 1.13 g/(24.h) (ref 0.63–2.50)
DOPAMINE, 24 HR URINE: 129 ug/(24.h) (ref 52–480)
EPINEPHRINE, 24 HR URINE: 4 ug/(24.h) (ref 2–24)
NOREPINEPHRINE, 24 HR UR: 20 ug/(24.h) (ref 15–100)
Total Volume - CF 24Hr U: 900 mL

## 2016-11-28 LAB — METANEPHRINES, URINE, 24 HOUR
Metaneph Total, Ur: 358 mcg/24 h (ref 224–832)
Metanephrines, Ur: 120 mcg/24 h (ref 90–315)
Normetanephrine, 24H Ur: 238 mcg/24 h (ref 122–676)

## 2016-11-28 MED ORDER — TRAMADOL HCL 50 MG PO TABS: 50.0000 mg | ORAL_TABLET | Freq: Four times a day (QID) | ORAL | 0 refills | Status: DC | PRN

## 2016-12-01 ENCOUNTER — Encounter: Payer: Self-pay | Admitting: *Deleted

## 2016-12-04 ENCOUNTER — Other Ambulatory Visit: Payer: Medicare Other

## 2016-12-04 NOTE — Telephone Encounter (Signed)
Unread mychart message mailed to patient 

## 2016-12-07 MED ORDER — BETAMETHASONE SOD PHOS & ACET 6 (3-3) MG/ML IJ SUSP: 3.0000 mg | Freq: Once | INTRAMUSCULAR | Status: DC

## 2016-12-07 NOTE — Progress Notes (Signed)
   HPI: Patient presents today for evaluation of pain which she describes as a stabbing pain to the right foot fourth toe. This been ongoing for approximately 2-3 months now. It was a sudden onset and patient denies injury or trauma.   Physical Exam: General: The patient is alert and oriented x3 in no acute distress.  Dermatology: Skin is warm, dry and supple bilateral lower extremities. Negative for open lesions or macerations.  Vascular: Palpable pedal pulses bilaterally. No edema or erythema noted. Capillary refill within normal limits.  Neurological: Epicritic and protective threshold grossly intact bilaterally.   Musculoskeletal Exam: Pain with palpation to the third interspace the right foot and lateral compression of the metatarsal heads consistent with a neuroma. Range of motion within normal limits to all pedal and ankle joints bilateral. Muscle strength 5/5 in all groups bilateral.   Radiographic Exam:  Normal osseous mineralization. Joint spaces preserved. No fracture/dislocation/boney destruction.    Assessment: 1. Neuroma third interspace right foot  Plan of Care:  1. Patient was evaluated. X-rays reviewed today 2. Injection of 0.5 mL Celestone Soluspan injected in the patient's third interspace right foot 3. Prescription for tramadol 50 mg #60 4. Recommend good shoe gear   Edrick Kins, DPM [facility]  Dr. Edrick Kins, DPM    [address], [place] [address]                Office [phone]  Fax [phone]

## 2017-03-03 ENCOUNTER — Telehealth: Payer: Self-pay | Admitting: Family Medicine

## 2017-03-03 DIAGNOSIS — R911 Solitary pulmonary nodule: Secondary | ICD-10-CM

## 2017-03-03 DIAGNOSIS — D3500 Benign neoplasm of unspecified adrenal gland: Secondary | ICD-10-CM

## 2017-03-03 NOTE — Telephone Encounter (Signed)
It might be cheaper for him to have it done at the [facility]. They should contact the [facility] regarding this. We can provide the imaging report and any additional information needed. If they would like to proceed through [place] we can place the orders. Please check with the patient's daughter. Thanks.

## 2017-03-03 NOTE — Telephone Encounter (Signed)
Patients daughter said she would like to proceed with the follow up imaging and was also wondering if it would be cheaper for him to have it done at the [facility]?

## 2017-03-03 NOTE — Telephone Encounter (Signed)
Reviewed patients chart and it appears we did not get a response regarding repeat imaging for his adrenal adenoma or lung nodule. Based on his prior imaging study repeating a CT abdomen and pelvis to follow-up on the adrenal adenoma is recommended in 6-12 months from the original scan and the same time frame for his lung nodule. Both of these findings are likely benign. If he would like to proceed with follow-up imaging I can place orders. Thanks.

## 2017-03-03 NOTE — Telephone Encounter (Signed)
Left message to return call 

## 2017-03-04 ENCOUNTER — Other Ambulatory Visit: Payer: Self-pay | Admitting: Podiatry

## 2017-03-04 NOTE — Telephone Encounter (Signed)
Patients daughter says that they want to have it done at the [facility]. She said that normally his PCP places orders and he takes them with him to the [facility].

## 2017-03-11 DIAGNOSIS — D3500 Benign neoplasm of unspecified adrenal gland: Secondary | ICD-10-CM | POA: Insufficient documentation

## 2017-03-11 DIAGNOSIS — R911 Solitary pulmonary nodule: Secondary | ICD-10-CM | POA: Insufficient documentation

## 2017-03-11 NOTE — Telephone Encounter (Signed)
Left message to notify orders are at front desk

## 2017-03-11 NOTE — Telephone Encounter (Signed)
Written prescriptions completed.

## 2017-08-18 ENCOUNTER — Telehealth: Payer: Self-pay | Admitting: Family Medicine

## 2017-08-18 NOTE — Telephone Encounter (Signed)
I am happy to refill this medication for him. Please confirm that he does not take the prozac that is listed. If he does not take the prozac you can send in sertraline 25 mg tablets with the directions take one tablet (25 mg) by mouth daily dispense 30 tablets 5 refills. Thanks.

## 2017-08-18 NOTE — Telephone Encounter (Signed)
Copied from [place]. Topic: Quick Communication - See Telephone Encounter >> Aug 18, 2017 12:10 PM Conception Chancy, NT wrote: CRM for notification. See Telephone encounter for: 08/18/17.  Patient daughter is calling and states that the patient goes to the [facility] in [place]. She states that his doctor there has got up and left. She states the patient was being prescribed Sertraline (zoloft) 50mg  with a 15 quantity and splitting the pills in half to have a 30 day supply. She states she would like to know could Dr. Caryl Bis take over this medication [facility]. She will call back with a pharmacy and would like to know could Dr. Caryl Bis.

## 2017-08-18 NOTE — Telephone Encounter (Signed)
Please advise 

## 2017-08-18 NOTE — Telephone Encounter (Signed)
Pt daughter called and wanted to give the pharmacy the medication to go to:  [facility] [address] [place] [facility] [place] [phone] (Phone) [phone] (Fax)     Also she would like to know if the pt could get the 25mg  with a quantity of 30.

## 2017-08-19 MED ORDER — SERTRALINE HCL 25 MG PO TABS: 25.0000 mg | ORAL_TABLET | Freq: Every day | ORAL | 1 refills | Status: DC

## 2017-08-19 NOTE — Telephone Encounter (Signed)
Patient states he is taking the Prozac but was in the process of weaning off of Prozac. He is currently taking Prozac 20 mg, he was on 20 originally, he states he has not taken the Prozac the last two days. Patient states he ran out of sertraline 50 mg ( half a tablet daily ) he has been out of sertraline since 07/17/2017 [facility] pharmacy.

## 2017-08-19 NOTE — Telephone Encounter (Signed)
I sent in Zoloft for him to take.  He should be able to discontinue the Prozac at this point.  It has a long half-life and is self tapering.  He should start on the Zoloft.  Please have him set up follow-up with me.  Thanks.

## 2017-08-20 NOTE — Telephone Encounter (Signed)
Left detailed message, patient is scheduled for office visit tomorrow

## 2017-08-21 ENCOUNTER — Ambulatory Visit (INDEPENDENT_AMBULATORY_CARE_PROVIDER_SITE_OTHER): Payer: Medicare Other | Admitting: Family Medicine

## 2017-08-21 ENCOUNTER — Encounter: Payer: Self-pay | Admitting: Family Medicine

## 2017-08-21 ENCOUNTER — Other Ambulatory Visit: Payer: Self-pay

## 2017-08-21 DIAGNOSIS — F329 Major depressive disorder, single episode, unspecified: Secondary | ICD-10-CM

## 2017-08-21 DIAGNOSIS — R0789 Other chest pain: Secondary | ICD-10-CM | POA: Diagnosis not present

## 2017-08-21 DIAGNOSIS — D3500 Benign neoplasm of unspecified adrenal gland: Secondary | ICD-10-CM | POA: Diagnosis not present

## 2017-08-21 DIAGNOSIS — F419 Anxiety disorder, unspecified: Secondary | ICD-10-CM

## 2017-08-21 DIAGNOSIS — R911 Solitary pulmonary nodule: Secondary | ICD-10-CM

## 2017-08-21 DIAGNOSIS — I10 Essential (primary) hypertension: Secondary | ICD-10-CM | POA: Diagnosis not present

## 2017-08-21 DIAGNOSIS — F32A Depression, unspecified: Secondary | ICD-10-CM

## 2017-08-21 MED ORDER — SERTRALINE HCL 50 MG PO TABS: 50.0000 mg | ORAL_TABLET | Freq: Every day | ORAL | 2 refills | Status: DC

## 2017-08-21 NOTE — Assessment & Plan Note (Signed)
Symptoms consistent with musculoskeletal chest pain.  He reports prior evaluation through cardiology.  He will continue to monitor.  Given return precautions.

## 2017-08-21 NOTE — Assessment & Plan Note (Signed)
Appears similar in size.  No recent smoking history.  Follow-up scan in 1 year as advised by the [facility].

## 2017-08-21 NOTE — Patient Instructions (Signed)
Nice to see you. Please increase her Zoloft to 50 mg by mouth daily.  We will see if this helps with your panic attacks and anxiety. Please monitor your blood pressure. If you develop chest pressure or shortness of breath or any new or changing symptoms please seek medical attention. Please make sure you find out who we need to fax your prior CT scan to at the [facility].

## 2017-08-21 NOTE — Assessment & Plan Note (Signed)
Did not improve with addition of Zoloft 25 mg.  We will have him increase it to 50 mg daily.  He gets Xanax through the [facility].  He will monitor his symptoms.  Follow-up in 3 months.

## 2017-08-21 NOTE — Assessment & Plan Note (Signed)
At goal. Continue current medication. 

## 2017-08-21 NOTE — Assessment & Plan Note (Signed)
Recent scan through the [facility] interpreted as an adrenal adenoma based on imaging characteristics.  No size given.  Family will find out who we need to send our previous scan to and we will fax it to the [facility].  I did encourage him to have his follow-up scan in 1 year as advised by the [facility].

## 2017-08-21 NOTE — Progress Notes (Signed)
[name], MD Phone: [phone]  Allen David is a 84 y.o. male who presents today for f/u.  HYPERTENSION  Disease Monitoring  Home BP Monitoring 138-148/78-84 Chest pain- see below    Dyspnea- no Medications  Compliance-  Taking amlodipine.  Edema- no  Patient notes several decades worth of intermittent aching and muscular soreness in his right chest that may happen once a week or once every other week.  It is not exertional.  Does not radiate.  No shortness of breath.  He has seen cardiology previously for this and reports he was advised it was not cardiac in nature.  States he was advised it was muscular.  Anxiety/depression: Patient notes having intermittent panic attacks.  He does note some depression when he has the panic attack though otherwise not much depression.  He was being tapered down on Prozac and started on Zoloft through the [facility] though his [facility] PCP left.  He has been off the Zoloft for about 5 days.  He continued to have anxiety symptoms on the 25 mg dose of Zoloft.  He takes Xanax half a tablet twice daily and occasionally takes it a third time.  Denies SI.  He had a follow-up CT scan of his chest for lung nodule.  This was found to be 6 mm and appears stable when compared to measurement from our imaging study.  Also found to have an adrenal adenoma on our study.  They gave no size measurement though did note that the imaging characteristics were compatible with an adenoma.    Social History   Tobacco Use  Smoking Status Former Smoker  . Last attempt to quit: 05/19/1966  . Years since quitting: 51.2  Smokeless Tobacco Former Systems developer  Tobacco Comment   Quit in [place] see history of present illness  Objective  Physical Exam Vitals:   08/21/17 1101  BP: 132/68  Pulse: 63  Temp: (!) 97.4 F (36.3 C)  SpO2: 97%    BP Readings from Last 3 Encounters:  08/21/17 132/68  11/28/16 136/75  11/17/16 120/68   Wt Readings from Last 3 Encounters:    08/21/17 201 lb 6.4 oz (91.4 kg)  11/17/16 187 lb (84.8 kg)  11/04/16 188 lb 9.6 oz (85.5 kg)    Physical Exam  Constitutional: No distress.  Cardiovascular: Normal rate, regular rhythm and normal heart sounds.  Pulmonary/Chest: Effort normal and breath sounds normal. He exhibits no tenderness.  Musculoskeletal: He exhibits no edema.  Neurological: He is alert. Gait normal.  Skin: Skin is warm and dry. He is not diaphoretic.     Assessment/Plan: Please see individual problem list.  Hypertension At goal.  Continue current medication.  Adrenal adenoma [facility] as [facility] based on imaging characteristics.  No size given.  Family will find out who we need to send our previous scan to and we will fax it to the [facility].  I did encourage him to have his follow-up scan in 1 year as advised by the [facility].  Incidental lung nodule, > 87mm and < 60mm Appears similar in size.  No recent smoking history.  Follow-up scan in 1 year as advised by the [facility].  Anxiety and depression Did not improve with addition of Zoloft 25 mg.  We will have him increase it to 50 mg daily.  He gets Xanax through the [facility].  He will monitor his symptoms.  Follow-up in 3 months.  Muscular chest pain  Symptoms consistent with musculoskeletal chest pain.  He reports prior evaluation through cardiology.  He will continue to monitor.  Given return precautions.   No orders of the defined types were placed in this encounter.   Meds ordered this encounter  Medications  . sertraline (ZOLOFT) 50 MG tablet    Sig: Take 1 tablet (50 mg total) by mouth daily.    Dispense:  90 tablet    Refill:  2     [name], MD [place]

## 2017-09-16 ENCOUNTER — Encounter: Payer: Self-pay | Admitting: Family Medicine

## 2017-09-16 ENCOUNTER — Ambulatory Visit (INDEPENDENT_AMBULATORY_CARE_PROVIDER_SITE_OTHER): Payer: Medicare Other | Admitting: Family Medicine

## 2017-09-16 DIAGNOSIS — J309 Allergic rhinitis, unspecified: Secondary | ICD-10-CM

## 2017-09-16 NOTE — Progress Notes (Signed)
  [name], MD Phone: [phone]  Allen David is a 84 y.o. male who presents today for same day visit.   Patient notes for the last 4 days he has had symptoms.  [place]ed [place].  Notes his eyes were watering and he had a little hazy vision.  He notes some slight irritation to his eyes though no photophobia or eye pain.  Continues to have watery eyes with matting shot.  He has had drainage and rhinorrhea as well.  His eyes have been matted shut in the morning.  He has nasal and sinus congestion as well.  He has been using warm compresses and eye gel for his symptoms.  Social History   Tobacco Use  Smoking Status Former Smoker  . Last attempt to quit: 05/19/1966  . Years since quitting: 51.3  Smokeless Tobacco Former Systems developer  Tobacco Comment   Quit in [place] see history of present illness  Objective  Physical Exam Vitals:   09/16/17 1522  BP: 120/78  Pulse: 91  Temp: 97.8 F (36.6 C)  SpO2: 97%    BP Readings from Last 3 Encounters:  09/16/17 120/78  08/21/17 132/68  11/28/16 136/75   Wt Readings from Last 3 Encounters:  09/16/17 195 lb 12.8 oz (88.8 kg)  08/21/17 201 lb 6.4 oz (91.4 kg)  11/17/16 187 lb (84.8 kg)    Physical Exam  Constitutional: No distress.  HENT:  Head: Normocephalic and atraumatic.  Mouth/Throat: Oropharynx is clear and moist. No oropharyngeal exudate.  Normal TMs bilaterally  Eyes: Pupils are equal, round, and reactive to light.  Slight clear watering of bilateral eyes with minimal conjunctival erythema, cornea is grossly normal, no obvious foreign bodies  Neck: Neck supple.  Cardiovascular: Normal rate, regular rhythm and normal heart sounds.  Pulmonary/Chest: Effort normal and breath sounds normal.  Lymphadenopathy:    He has no cervical adenopathy.  Neurological: He is alert.  Skin: Skin is warm and dry. He is not diaphoretic.     Assessment/Plan: Please see  individual problem list.  Allergic rhinitis Symptoms most consistent with allergic rhinitis though also could be viral illness.  Unlikely bacterial.  Vision checked and is acceptable.  No red flag symptoms.  Discussed use of Claritin over-the-counter to help with the symptoms.  Given return precautions.  If not improving he will let us know.   No orders of the defined types were placed in this encounter.   No orders of the defined types were placed in this encounter.    [name], MD [facility]

## 2017-09-16 NOTE — Patient Instructions (Signed)
Nice to see you. Please continue to use warm compresses.  You can use Claritin over-the-counter to help with your symptoms. If they are not improving with this please let us know. If you develop eye pain, vision changes, light sensitivity, or any new or changing symptoms please seek medical attention immediately.

## 2017-09-16 NOTE — Assessment & Plan Note (Signed)
Symptoms most consistent with allergic rhinitis though also could be viral illness.  Unlikely bacterial.  Vision checked and is acceptable.  No red flag symptoms.  Discussed use of Claritin over-the-counter to help with the symptoms.  Given return precautions.  If not improving he will let us know.

## 2017-11-18 ENCOUNTER — Ambulatory Visit: Payer: No Typology Code available for payment source

## 2017-11-27 ENCOUNTER — Ambulatory Visit: Payer: Medicare Other | Admitting: Family Medicine

## 2017-12-15 ENCOUNTER — Encounter: Payer: Self-pay | Admitting: Family Medicine

## 2017-12-15 ENCOUNTER — Telehealth: Payer: Self-pay

## 2017-12-15 NOTE — Telephone Encounter (Signed)
Copied from [place] [id number]. Topic: Inquiry >> Dec 15, 2017  8:50 AM Valla Leaver wrote: Reason for CRM: Daughter, [name], calling because patient is on zoloft and needs to confirm any changes in dosage but his appt was cancelled with [facility] on 07/12 b/c he was out of the office. The next available is 04/02/2018 and [name] wants the patient to be worked in sooner. Please call patient to advise. Wants to know if patient ca get a paper script.

## 2017-12-15 NOTE — Telephone Encounter (Signed)
You could offer the next available 4:30 appointment on a Monday or a Wednesday or you could offer him an appointment with [facility] for follow-up on his anxiety.

## 2017-12-15 NOTE — Telephone Encounter (Signed)
Please advise 

## 2017-12-16 NOTE — Telephone Encounter (Signed)
Patient is scheduled and notified

## 2017-12-23 ENCOUNTER — Encounter: Payer: Self-pay | Admitting: Family Medicine

## 2017-12-23 ENCOUNTER — Ambulatory Visit (INDEPENDENT_AMBULATORY_CARE_PROVIDER_SITE_OTHER): Payer: Medicare Other | Admitting: Family Medicine

## 2017-12-23 ENCOUNTER — Other Ambulatory Visit: Payer: Self-pay

## 2017-12-23 VITALS — BP 116/78 | HR 70 | Temp 98.1°F | Wt 194.0 lb

## 2017-12-23 DIAGNOSIS — I10 Essential (primary) hypertension: Secondary | ICD-10-CM

## 2017-12-23 DIAGNOSIS — R911 Solitary pulmonary nodule: Secondary | ICD-10-CM | POA: Diagnosis not present

## 2017-12-23 DIAGNOSIS — D3500 Benign neoplasm of unspecified adrenal gland: Secondary | ICD-10-CM

## 2017-12-23 DIAGNOSIS — F329 Major depressive disorder, single episode, unspecified: Secondary | ICD-10-CM | POA: Diagnosis not present

## 2017-12-23 DIAGNOSIS — E538 Deficiency of other specified B group vitamins: Secondary | ICD-10-CM | POA: Diagnosis not present

## 2017-12-23 DIAGNOSIS — G629 Polyneuropathy, unspecified: Secondary | ICD-10-CM

## 2017-12-23 DIAGNOSIS — F419 Anxiety disorder, unspecified: Secondary | ICD-10-CM

## 2017-12-23 MED ORDER — SERTRALINE HCL 100 MG PO TABS: 100.0000 mg | ORAL_TABLET | Freq: Every day | ORAL | 1 refills | Status: DC

## 2017-12-23 NOTE — Patient Instructions (Addendum)
Nice to see you. We will increase your Zoloft.  I have included a prescription for this. We will check lab work today and contact you with the results. [facility] of the [facility] nodule as well as the adrenal adenoma.  If you would prefer to do these follow-up images through our office please let us know.

## 2017-12-23 NOTE — Progress Notes (Signed)
[name], MD Phone: [phone]  Allen David is a 84 y.o. male who presents today for f/u.  CC: htn, depression/anxiety, finger numbness  HYPERTENSION  Disease Monitoring  Home BP Monitoring 128/80 Chest pain- no    Dyspnea- no Medications  Compliance-  Taking amlodipine.  Edema- no  Depression/anxiety: Patient notes no depression.  He does note if he gets in a crowd he will feel excited and jittery.  Zoloft has not made much of a difference.  He takes Xanax to help him sleep at night and half in the morning.  He gets this from the [facility].  Fingertip numbness: His fingertips felt numb to his MCP joints for about a year.  Has been gradual.  There is an aching discomfort.  No foot involvement.  No weakness.  Adrenal adenoma: Patient is due for follow-up imaging of this as well as a lung nodule.  I discussed the potential for [place] doing this versus him having this done through the [facility].  Social History   Tobacco Use  Smoking Status Former Smoker  . Last attempt to quit: 05/19/1966  . Years since quitting: 51.6  Smokeless Tobacco Former User  Tobacco Comment   Quit in [place] see history of present illness  Objective  Physical Exam Vitals:   12/23/17 1610  BP: 116/78  Pulse: 70  Temp: 98.1 F (36.7 C)  SpO2: 97%    BP Readings from Last 3 Encounters:  12/23/17 116/78  09/16/17 120/78  08/21/17 132/68   Wt Readings from Last 3 Encounters:  12/23/17 194 lb (88 kg)  09/16/17 195 lb 12.8 oz (88.8 kg)  08/21/17 201 lb 6.4 oz (91.4 kg)    Physical Exam  Constitutional: No distress.  Cardiovascular: Normal rate, regular rhythm and normal heart sounds.  Pulmonary/Chest: Effort normal and breath sounds normal.  Musculoskeletal: He exhibits no edema.  Neurological: He is alert.  CN 2-12 intact, 5/5 strength in bilateral biceps, triceps, grip, quads, hamstrings, plantar and dorsiflexion, sensation to light touch slightly decreased in bilateral fingers,  otherwise intact in bilateral UE and LE, normal gait  Skin: Skin is warm and dry. He is not diaphoretic.  Negative Tinel's and Phalen's bilateral wrists   Assessment/Plan: Please see individual problem list.  Hypertension Adequately controlled.  Continue current regimen.  Adrenal adenoma Patient opted to have this followed up through the [facility].  He has an appointment with his new [facility] next month and he will discuss this with them at that time.  Anxiety and depression Increase Zoloft.  He will continue to receive his Xanax through the [facility].  Incidental lung nodule, > 60m and < 871mPatient [facility] them next month.  Neuropathy Concern for neuropathy in his bilateral fingers.  We will check a B12 and glucose.  Consider referral to neurology pending lab work.   Orders Placed This Encounter  Procedures  . Comp Met (CMET)  . B12    Meds ordered this encounter  Medications  . DISCONTD: sertraline (ZOLOFT) 100 MG tablet    Sig: Take 1 tablet (100 mg total) by mouth daily.    Dispense:  90 tablet    Refill:  1  . sertraline (ZOLOFT) 100 MG tablet    Sig: Take 1 tablet (100 mg total) by mouth daily.    Dispense:  90 tablet    Refill:  1 [place]  MD [place]

## 2017-12-24 LAB — VITAMIN B12: Vitamin B-12: 241 pg/mL (ref 211–911)

## 2017-12-25 LAB — COMPREHENSIVE METABOLIC PANEL
ALT: 22 U/L (ref 0–53)
AST: 29 U/L (ref 0–37)
Albumin: 4.3 g/dL (ref 3.5–5.2)
Alkaline Phosphatase: 35 U/L — ABNORMAL LOW (ref 39–117)
BILIRUBIN TOTAL: 0.5 mg/dL (ref 0.2–1.2)
BUN: 29 mg/dL — AB (ref 6–23)
CO2: 22 meq/L (ref 19–32)
Calcium: 9.5 mg/dL (ref 8.4–10.5)
Chloride: 108 mEq/L (ref 96–112)
Creatinine, Ser: 1.21 mg/dL (ref 0.40–1.50)
GFR: 60.08 mL/min (ref >60.00)
GLUCOSE: 86 mg/dL (ref 70–99)
Potassium: 4.7 mEq/L (ref 3.5–5.1)
SODIUM: 142 meq/L (ref 135–145)
TOTAL PROTEIN: 7.3 g/dL (ref 6.0–8.3)

## 2017-12-26 DIAGNOSIS — G629 Polyneuropathy, unspecified: Secondary | ICD-10-CM | POA: Insufficient documentation

## 2017-12-26 NOTE — Assessment & Plan Note (Signed)
Adequately controlled.  Continue current regimen. 

## 2017-12-26 NOTE — Assessment & Plan Note (Signed)
Patient opted to have follow-up imaging through the [facility].  He will follow-up with them next month.

## 2017-12-26 NOTE — Assessment & Plan Note (Signed)
Patient opted to have this followed up through the [facility].  He has an appointment with his new [facility] next month and he will discuss this with them at that time.

## 2017-12-26 NOTE — Assessment & Plan Note (Signed)
Concern for neuropathy in his bilateral fingers.  We will check a B12 and glucose.  Consider referral to neurology pending lab work.

## 2017-12-26 NOTE — Assessment & Plan Note (Signed)
Increase Zoloft.  He will continue to receive his Xanax through the [facility].

## 2017-12-27 ENCOUNTER — Encounter: Payer: Self-pay | Admitting: Family Medicine

## 2017-12-28 ENCOUNTER — Other Ambulatory Visit: Payer: Self-pay | Admitting: Family Medicine

## 2017-12-28 DIAGNOSIS — R200 Anesthesia of skin: Secondary | ICD-10-CM

## 2017-12-30 ENCOUNTER — Ambulatory Visit (INDEPENDENT_AMBULATORY_CARE_PROVIDER_SITE_OTHER): Payer: Medicare Other | Admitting: *Deleted

## 2017-12-30 DIAGNOSIS — E538 Deficiency of other specified B group vitamins: Secondary | ICD-10-CM | POA: Diagnosis not present

## 2017-12-30 MED ORDER — CYANOCOBALAMIN 1000 MCG/ML IJ SOLN
1000.0000 ug | Freq: Once | INTRAMUSCULAR | Status: AC
  Administered 2017-12-30: 1000 ug via INTRAMUSCULAR

## 2017-12-30 NOTE — Progress Notes (Signed)
Patient presented for B 12 injection to left deltoid, patient voiced no concerns nor showed any signs of distress during injection. 
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
## 2018-01-05 ENCOUNTER — Ambulatory Visit (INDEPENDENT_AMBULATORY_CARE_PROVIDER_SITE_OTHER): Payer: Medicare Other | Admitting: Cardiovascular Disease

## 2018-01-05 ENCOUNTER — Encounter: Payer: Self-pay | Admitting: Cardiovascular Disease

## 2018-01-05 VITALS — BP 110/60 | HR 56 | Ht 72.0 in | Wt 190.2 lb

## 2018-01-05 DIAGNOSIS — I700 Atherosclerosis of aorta: Secondary | ICD-10-CM | POA: Diagnosis not present

## 2018-01-05 DIAGNOSIS — F329 Major depressive disorder, single episode, unspecified: Secondary | ICD-10-CM | POA: Diagnosis not present

## 2018-01-05 DIAGNOSIS — E782 Mixed hyperlipidemia: Secondary | ICD-10-CM | POA: Diagnosis not present

## 2018-01-05 DIAGNOSIS — I25119 Atherosclerotic heart disease of native coronary artery with unspecified angina pectoris: Secondary | ICD-10-CM

## 2018-01-05 DIAGNOSIS — F32A Depression, unspecified: Secondary | ICD-10-CM

## 2018-01-05 DIAGNOSIS — F419 Anxiety disorder, unspecified: Secondary | ICD-10-CM

## 2018-01-05 DIAGNOSIS — I10 Essential (primary) hypertension: Secondary | ICD-10-CM

## 2018-01-05 MED ORDER — ATORVASTATIN CALCIUM 20 MG PO TABS: 20.0000 mg | ORAL_TABLET | Freq: Every day | ORAL | 3 refills | Status: AC

## 2018-01-05 NOTE — Patient Instructions (Addendum)
Medication Instructions:   Please start lipitor 20 mg daily, for high cholesterol  Primary care can refill your medications  Labwork:  No new labs needed  Testing/Procedures:  No further testing at this time   Follow-Up: It was a pleasure seeing you in the office today. Please call us if you have new issues that need to be addressed before your next appt.  [phone]  Your physician wants you to follow-up in:  Yearly As needed  If you need a refill on your cardiac medications before your next appointment, please call your pharmacy.  For educational health videos Log in to : [email or web address] Or : [email or web address], password : [email or web address]

## 2018-01-07 ENCOUNTER — Ambulatory Visit (INDEPENDENT_AMBULATORY_CARE_PROVIDER_SITE_OTHER): Payer: Medicare Other

## 2018-01-07 DIAGNOSIS — E538 Deficiency of other specified B group vitamins: Secondary | ICD-10-CM

## 2018-01-07 MED ORDER — CYANOCOBALAMIN 1000 MCG/ML IJ SOLN
1000.0000 ug | Freq: Once | INTRAMUSCULAR | Status: AC
  Administered 2018-01-07: 1000 ug via INTRAMUSCULAR

## 2018-01-07 NOTE — Progress Notes (Signed)
Patient presented for B 12 injection to left deltoid, patient voiced no concerns nor showed any signs of distress during injection. 

## 2018-01-08 NOTE — Progress Notes (Signed)
I have reviewed the above note and agree.  Channa Hazelett, M.D.  

## 2018-01-14 ENCOUNTER — Ambulatory Visit (INDEPENDENT_AMBULATORY_CARE_PROVIDER_SITE_OTHER): Payer: Medicare Other

## 2018-01-14 DIAGNOSIS — E538 Deficiency of other specified B group vitamins: Secondary | ICD-10-CM | POA: Diagnosis not present

## 2018-01-14 MED ORDER — CYANOCOBALAMIN 1000 MCG/ML IJ SOLN
1000.0000 ug | Freq: Once | INTRAMUSCULAR | Status: AC
  Administered 2018-01-14: 1000 ug via INTRAMUSCULAR

## 2018-01-14 NOTE — Progress Notes (Signed)
Patient comes in for B 12 injection.  Injected right deltoid.  Patient tolerated injection well.   

## 2018-02-16 ENCOUNTER — Ambulatory Visit (INDEPENDENT_AMBULATORY_CARE_PROVIDER_SITE_OTHER): Payer: Medicare Other

## 2018-02-16 DIAGNOSIS — D510 Vitamin B12 deficiency anemia due to intrinsic factor deficiency: Secondary | ICD-10-CM | POA: Diagnosis not present

## 2018-02-16 MED ORDER — CYANOCOBALAMIN 1000 MCG/ML IJ SOLN
1000.0000 ug | Freq: Once | INTRAMUSCULAR | Status: AC
  Administered 2018-02-16: 1000 ug via INTRAMUSCULAR

## 2018-02-16 NOTE — Progress Notes (Signed)
Patient comes in for B 12 injection.  Injected Right  deltoid.  Patient tolerated injection well.    

## 2018-03-08 DIAGNOSIS — R200 Anesthesia of skin: Secondary | ICD-10-CM | POA: Diagnosis not present

## 2018-03-08 DIAGNOSIS — E538 Deficiency of other specified B group vitamins: Secondary | ICD-10-CM | POA: Diagnosis not present

## 2018-03-09 DIAGNOSIS — R200 Anesthesia of skin: Secondary | ICD-10-CM | POA: Diagnosis not present

## 2018-03-17 DIAGNOSIS — G5603 Carpal tunnel syndrome, bilateral upper limbs: Secondary | ICD-10-CM | POA: Diagnosis not present

## 2018-03-23 ENCOUNTER — Ambulatory Visit (INDEPENDENT_AMBULATORY_CARE_PROVIDER_SITE_OTHER): Payer: Medicare Other | Admitting: *Deleted

## 2018-03-23 DIAGNOSIS — E538 Deficiency of other specified B group vitamins: Secondary | ICD-10-CM

## 2018-03-23 MED ORDER — CYANOCOBALAMIN 1000 MCG/ML IJ SOLN
1000.0000 ug | Freq: Once | INTRAMUSCULAR | Status: AC
  Administered 2018-03-23: 1000 ug via INTRAMUSCULAR

## 2018-03-23 NOTE — Progress Notes (Signed)
Patient presented for B 12 injection to left deltoid, patient voiced no concerns nor showed any signs of distress during injection. 

## 2018-03-26 ENCOUNTER — Telehealth: Payer: Self-pay | Admitting: Family Medicine

## 2018-03-26 NOTE — Telephone Encounter (Signed)
Patient daughter [name] called back asking for Rx to be sent to [facility] so he will not run out only have 1 pill left.

## 2018-03-26 NOTE — Telephone Encounter (Signed)
Copied from [place] [id number]. Topic: Quick Communication - Rx Refill/Question >> Mar 26, 2018  2:26 PM [name] wrote: Medication:  amLODipine (NORVASC) 5 MG tablet  Pt's daughter calling in.  States that the [place] hasn't refilled this medication since last 03/19/2017.  Pt's daughter wants to know if by chance pt was given a written script for this medication. [name] can be reached at [phone].

## 2018-03-26 NOTE — Telephone Encounter (Signed)
I returned Allen David's call stating that this script hadn't been filed by Dr. Caryl Bis. She asked if he could write a prescription & print it to be picked up. Patient gets med from [place] & has to have a hard copy. She asked for call if this can be done & is ready to pick up.

## 2018-03-29 MED ORDER — AMLODIPINE BESYLATE 5 MG PO TABS: 5.0000 mg | ORAL_TABLET | Freq: Every day | ORAL | 3 refills | Status: DC

## 2018-03-29 NOTE — Telephone Encounter (Signed)
I have sent this to [facility].  Please inform the patient's daughter.

## 2018-03-29 NOTE — Telephone Encounter (Signed)
Called and spoke with patient. Pt advised and voiced understanding. Was very grateful that Dr. Caryl Bis sent in his medication.

## 2018-03-29 NOTE — Telephone Encounter (Signed)
Dr. Caryl Bis are you OK with filling this Rx for the patient NORVASC normally filled by the [facility] but patient is out

## 2018-03-30 ENCOUNTER — Ambulatory Visit: Payer: Medicare Other | Admitting: Family Medicine

## 2018-04-05 ENCOUNTER — Other Ambulatory Visit: Payer: Self-pay

## 2018-04-12 DIAGNOSIS — G5603 Carpal tunnel syndrome, bilateral upper limbs: Secondary | ICD-10-CM | POA: Diagnosis not present

## 2018-05-14 ENCOUNTER — Encounter: Payer: Self-pay | Admitting: Internal Medicine

## 2018-05-14 ENCOUNTER — Ambulatory Visit (INDEPENDENT_AMBULATORY_CARE_PROVIDER_SITE_OTHER): Payer: Medicare Other | Admitting: Internal Medicine

## 2018-05-14 VITALS — BP 132/80 | HR 84 | Temp 97.7°F | Ht 72.0 in | Wt 185.4 lb

## 2018-05-14 DIAGNOSIS — K760 Fatty (change of) liver, not elsewhere classified: Secondary | ICD-10-CM

## 2018-05-14 DIAGNOSIS — E039 Hypothyroidism, unspecified: Secondary | ICD-10-CM

## 2018-05-14 DIAGNOSIS — M545 Low back pain, unspecified: Secondary | ICD-10-CM

## 2018-05-14 DIAGNOSIS — G8929 Other chronic pain: Secondary | ICD-10-CM

## 2018-05-14 DIAGNOSIS — F419 Anxiety disorder, unspecified: Secondary | ICD-10-CM

## 2018-05-14 DIAGNOSIS — Z23 Encounter for immunization: Secondary | ICD-10-CM

## 2018-05-14 DIAGNOSIS — R630 Anorexia: Secondary | ICD-10-CM | POA: Diagnosis not present

## 2018-05-14 DIAGNOSIS — R110 Nausea: Secondary | ICD-10-CM

## 2018-05-14 DIAGNOSIS — R109 Unspecified abdominal pain: Secondary | ICD-10-CM | POA: Diagnosis not present

## 2018-05-14 DIAGNOSIS — R911 Solitary pulmonary nodule: Secondary | ICD-10-CM

## 2018-05-14 MED ORDER — LEVOTHYROXINE SODIUM 25 MCG PO TABS: 25.0000 ug | ORAL_TABLET | Freq: Every day | ORAL | Status: DC

## 2018-05-14 NOTE — Progress Notes (Signed)
Chief Complaint  Patient presents with  . Acute Visit    left flank pain   F/u with 1 of daughters today  1. C/o left flank pain on and off x 1 year. Pain is intermittent, today no pain at times 2-3/10 turning to makes left flank pain worse. Tried otc tylenol and Ibuprofen w/o much help and he has tried to rest. He and daughter c/w issue with kidney due to pain being in this area. Urine at times is darker color but he does not drink much water. He does report h/o L4/5 back pain problems pain in the past has run down his legs currently no pain, numbness or tingling down his legs.  Reviewed CT ab/pelvis 11/18/16 no mention of kidney stones  Lower chest: There is mild scarring in the lung bases.  On axial slice 7 series 4, there is a 6 x 5 mm nodular opacity in the lateral segment left lower lobe. -lung nodule w/u with CT chest via [facility] 07/30/17   Adrenals/Urinary Tract: Right adrenal appears normal. There is an apparent left adrenal adenoma measuring 1.1 x 1.0 cm. There is a cyst arising from the posterosuperior right kidney measuring 1.2 x 1.0 cm. There is a cyst more medially in the upper right kidney measuring 1.4 x 1.4 cm. There is a 6 mm cyst arising the lateral inferior kidney on the right. On the left, there is a cyst arising from the periphery of the mid kidney measuring 2.3 x 1.6 cm. There is a cyst in the posterior mid to lower left kidney measuring 1.3 x 1.2 cm. There is no hydronephrosis on either side. There is no renal or ureteral calculus on either side. Urinary bladder is midline with wall thickness within normal limits.  Stomach/Bowel: There are multiple left-sided colonic diverticula without diverticulitis. No bowel wall or mesenteric thickening. No bowel obstruction. No free air or portal venous air.  Vascular/Lymphatic: There are a few small foci of atherosclerotic calcification in the aorta and common iliac arteries. No abdominal aortic aneurysm. Major mesenteric  vessels appear patent. There is no appreciable adenopathy in the abdomen or pelvis.  Musculoskeletal: There is degenerative change in the lumbar spine, most notably at L5-S1. There are no blastic or lytic bone lesions. No intramuscular or abdominal wall lesions are evident beyond the relative thinning of the lateral rectus muscles in the right mid abdominal region as noted above.  2. Anxiety uncontrolled hard to tell from med list if on zoloft 100 or 150 mg and per pt takes Xanax 1 mg qid prn-this [facility] [place] per daughter pt has been hesitant to see psychiatry in the past. He had been on lexapro in the past caused HTN and prozac in the past for years but due to sx's they thought prozac was not helping   3. Elevated TSH on [facility]roxine per [facility] this is not on his med list asked he confirm dose and let us know. TSH per labs 5.76 02/05/18 see scanned in labs  4. Would like flu shot today  5. He reports reduced appetite and due #2 at times and at times nausea   Review of Systems  Constitutional: Positive for weight loss.       Down 5 lbs   HENT: Negative for hearing loss.   Eyes: Negative for blurred vision.  Respiratory: Negative for shortness of breath.   Cardiovascular: Negative for chest pain.  Gastrointestinal: Positive for nausea. Negative for vomiting.  Genitourinary:       +  left flank pain    Musculoskeletal: Positive for back pain.  Skin: Negative for rash.  Psychiatric/Behavioral: The patient is nervous/anxious.    Past Medical History:  Diagnosis Date  . Anxiety   . Back injury    with chronic pain  . CAD in native artery   . Chronic low back pain   . Depression   . ED (erectile dysfunction)   . Elevated PSA   . HLD (hyperlipidemia)   . HTN (hypertension)   . IBS (irritable bowel syndrome)   . Malaise and fatigue   . Pernicious anemia    Past Surgical History:  Procedure Laterality Date  . EYE SURGERY    . VASECTOMY      Family History  Problem Relation Age of Onset  . Stroke Mother   . Hypertension Mother   . Ulcers Mother   . Cancer Father        multiple myeloma  . Diabetes Sister   . Diabetes Brother   . Diabetes Brother    Social History   Socioeconomic History  . Marital status: Single    Spouse name: Not on file  . Number of children: Not on file  . Years of education: Not on file  . Highest education level: Not on file  Occupational History  . Occupation: Retired  Scientific laboratory technician  . Financial resource strain: Not on file  . Food insecurity:    Worry: Not on file    Inability: Not on file  . Transportation needs:    Medical: Not on file    Non-medical: Not on file  Tobacco Use  . Smoking status: Former Smoker    Last attempt to quit: 05/19/1966    Years since quitting: 52.0  . Smokeless tobacco: Former Systems developer  . Tobacco comment: Quit in 1968  Substance and Sexual Activity  . Alcohol use: Yes    Alcohol/week: 0.0 - 1.0 standard drinks    Comment: daily  . Drug use: No  . Sexual activity: Not Currently  Lifestyle  . Physical activity:    Days per week: Not on file    Minutes per session: Not on file  . Stress: Not on file  Relationships  . Social connections:    Talks on phone: Not on file    Gets together: Not on file    Attends religious service: Not on file    Active member of club or organization: Not on file    Attends meetings of clubs or organizations: Not on file    Relationship status: Not on file  . Intimate partner violence:    Fear of current or ex partner: Not on file    Emotionally abused: Not on file    Physically abused: Not on file    Forced sexual activity: Not on file  Other Topics Concern  . Not on file  Social History Narrative   Lives in [place] alone. Children, 4 girls.      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      Regularly exercise- rides bike and mows lawn. Divorced.   Current Meds   Medication Sig  . Acetaminophen (TYLENOL ARTHRITIS PAIN PO) Take 1 tablet by mouth as needed.   . ALPRAZolam (XANAX) 1 MG tablet Take 1 mg by mouth 4 (four) times daily as needed for sleep.  Marland Kitchen amLODipine (NORVASC) 5 MG tablet Take 1 tablet (5 mg total) by mouth daily.  Marland Kitchen  aspirin (ASPIRIN 81) 81 MG EC tablet Take 1 tablet (81 mg total) by mouth daily.  Marland Kitchen atorvastatin (LIPITOR) 20 MG tablet Take 1 tablet (20 mg total) by mouth daily.  . Clobetasol Prop Emollient Base (CLOBETASOL PROPIONATE E) 0.05 % emollient cream Apply 0.05 % topically 2 (two) times daily.  . Multiple Vitamin (ONE-A-DAY MENS PO) Take 1 tablet by mouth daily.   . sertraline (ZOLOFT) 100 MG tablet Take 1 tablet (100 mg total) by mouth daily.   Current Facility-Administered Medications for the 05/14/18 encounter (Office Visit) with McLean-Scocuzza, Nino Glow, MD  Medication  . betamethasone acetate-betamethasone sodium phosphate (CELESTONE) injection 3 mg   Allergies  Allergen Reactions  . Lexapro [Escitalopram Oxalate]     hypertension  . Tetanus Toxoids Swelling   No results found for this or any previous visit (from the past 2160 hour(s)). Objective  Body mass index is 25.14 kg/m. Wt Readings from Last 3 Encounters:  05/14/18 185 lb 6.4 oz (84.1 kg)  01/05/18 190 lb 4 oz (86.3 kg)  12/23/17 194 lb (88 kg)   Temp Readings from Last 3 Encounters:  05/14/18 97.7 F (36.5 C) (Oral)  12/23/17 98.1 F (36.7 C) (Oral)  09/16/17 97.8 F (36.6 C) (Oral)   BP Readings from Last 3 Encounters:  05/14/18 132/80  01/05/18 110/60  12/23/17 116/78   Pulse Readings from Last 3 Encounters:  05/14/18 84  01/05/18 (!) 56  12/23/17 70    Physical Exam Vitals signs and nursing note reviewed.  Constitutional:      Appearance: Normal appearance. He is well-developed.  HENT:     Head: Normocephalic and atraumatic.     Mouth/Throat:     Mouth: Mucous membranes are moist.     Pharynx: Oropharynx is clear.  Eyes:      Pupils: Pupils are equal, round, and reactive to light.  Cardiovascular:     Rate and Rhythm: Normal rate and regular rhythm.     Heart sounds: Normal heart sounds.  Pulmonary:     Effort: Pulmonary effort is normal.     Breath sounds: Normal breath sounds.  Abdominal:     General: Abdomen is flat. Bowel sounds are normal. There is no distension.     Tenderness: There is no abdominal tenderness. There is no right CVA tenderness or left CVA tenderness.  Musculoskeletal:     Lumbar back: He exhibits no tenderness.       Back:  Skin:    General: Skin is warm and dry.  Neurological:     General: No focal deficit present.     Mental Status: He is alert and oriented to person, place, and time. Mental status is at baseline.     Gait: Gait normal.  Psychiatric:        Attention and Perception: Attention and perception normal.        Mood and Affect: Mood and affect normal.        Speech: Speech normal.        Behavior: Behavior normal. Behavior is cooperative.        Thought Content: Thought content normal.        Cognition and Memory: Cognition and memory normal.        Judgment: Judgment normal.     Comments: Very talkative today       Assessment   1. Left flank pain ddx [facility] due to lumbar DDD/arthritis changes w/o current radicular sx's r/o kidney stone check UA today for hematuria CT  11/18/16 was negative kidney stone, pancreas and spleen were normal size 11/18/16 2. Anxiety  3. Elevated TSH c/w [facility]othy[facility] 02/05/18  4. Reduced appetite and nausea could be related to #2 vs other  -daughter reports a lot of changes in the last 3 months but does not elaborate today 5.HM 6. Lung nodule with w/u 07/30/17  Plan   1. Negative exam findings today  Will check UA and culture to r/o possibility of kidney stones I.e hematuria or infection  Offered to do low back Xray and ordered for future but pt declines today  Offered to repeat CT ab/pelvis w/o contrast pt declines for today     2. Consider psychiatry consult in future on zoloft 100 or 150 mg qd and xanax per pt Rx 1 mg qid but taking 1/4-1/2 prn 1 mg xanax  F/u with PCP here or [place]   3. On levothyroxine per [facility] lab notes 02/05/18 pt and daughter to let us know the dose I assumed lowest levothyroxine 25 mcg qd  TSH needs to be repeat in the future was 5.76 on 02/05/18   4. See #2 needs psychiatry consultation  If urine labs normal consider further w/u for reduced appetite and nausea can disc with PCP in future  Will not add additional medications today   5. Flu shot high dose given today  6.  Reviewed CT chest 07/30/17 with left lower lobe 6 mm lung nodule, bronchiectasis, lung scarring and left adrenal adenoma.   Provider: Dr. Olivia Mackie McLean-Scocuzza-Internal Medicine

## 2018-05-14 NOTE — Patient Instructions (Addendum)
Check dose of levothyroxine thyroid medication and let Dr. Chauncey Cruel know   You can try Tylenol 500 up to 6x per day, heat   Take to [facility] about psychiatrist for anxiety   Generalized Anxiety Disorder, Adult Generalized anxiety disorder (GAD) is a mental health disorder. People with this condition constantly worry about everyday events. Unlike normal anxiety, worry related to GAD is not triggered by a specific event. These worries also do not fade or get better with time. GAD interferes with life functions, including relationships, work, and school. GAD can vary from mild to severe. People with severe GAD can have intense waves of anxiety with physical symptoms (panic attacks). What are the causes? The exact cause of GAD is not known. What increases the risk? This condition is more likely to develop in:  Women.  People who have a family history of anxiety disorders.  People who are very shy.  People who experience very stressful life events, such as the death of a loved one.  People who have a very stressful family environment. What are the signs or symptoms? People with GAD often worry excessively about many things in their lives, such as their health and family. They may also be overly concerned about:  Doing well at work.  Being on time.  Natural disasters.  Friendships. Physical symptoms of GAD include:  Fatigue.  Muscle tension or having muscle twitches.  Trembling or feeling shaky.  Being easily startled.  Feeling like your heart is pounding or racing.  Feeling out of breath or like you cannot take a deep breath.  Having trouble falling asleep or staying asleep.  Sweating.  Nausea, diarrhea, or irritable bowel syndrome (IBS).  Headaches.  Trouble concentrating or remembering facts.  Restlessness.  Irritability. How is this diagnosed? Your health care provider can diagnose GAD based on your symptoms and medical history. You will also have a physical exam. The  health care provider will ask specific questions about your symptoms, including how severe they are, when they started, and if they come and go. Your health care provider may ask you about your use of alcohol or drugs, including prescription medicines. Your health care provider may refer you to a mental health specialist for further evaluation. Your health care provider will do a thorough examination and may perform additional tests to rule out other possible causes of your symptoms. To be diagnosed with GAD, a person must have anxiety that:  Is out of his or her control.  Affects several different aspects of his or her life, such as work and relationships.  Causes distress that makes him or her unable to take part in normal activities.  Includes at least three physical symptoms of GAD, such as restlessness, fatigue, trouble concentrating, irritability, muscle tension, or sleep problems. Before your health care provider can confirm a diagnosis of GAD, these symptoms must be present more days than they are not, and they must last for six months or longer. How is this treated? The following therapies are usually used to treat GAD:  Medicine. Antidepressant medicine is usually prescribed for long-term daily control. Antianxiety medicines may be added in severe cases, especially when panic attacks occur.  Talk therapy (psychotherapy). Certain types of talk therapy can be helpful in treating GAD by providing support, education, and guidance. Options include: ? Cognitive behavioral therapy (CBT). People learn coping skills and techniques to ease their anxiety. They learn to identify unrealistic or negative thoughts and behaviors and to replace them with positive ones. ?  Acceptance and commitment therapy (ACT). This treatment teaches people how to be mindful as a way to cope with unwanted thoughts and feelings. ? Biofeedback. This process trains you to manage your body's response (physiological  response) through breathing techniques and relaxation methods. You will work with a therapist while machines are used to monitor your physical symptoms.  Stress management techniques. These include yoga, meditation, and exercise. A mental health specialist can help determine which treatment is best for you. Some people see improvement with one type of therapy. However, other people require a combination of therapies. Follow these instructions at home:  Take over-the-counter and prescription medicines only as told by your health care provider.  Try to maintain a normal routine.  Try to anticipate stressful situations and allow extra time to manage them.  Practice any stress management or self-calming techniques as taught by your health care provider.  Do not punish yourself for setbacks or for not making progress.  Try to recognize your accomplishments, even if they are small.  Keep all follow-up visits as told by your health care provider. This is important. Contact a health care provider if:  Your symptoms do not get better.  Your symptoms get worse.  You have signs of depression, such as: ? A persistently sad, cranky, or irritable mood. ? Loss of enjoyment in activities that used to bring you joy. ? Change in weight or eating. ? Changes in sleeping habits. ? Avoiding friends or family members. ? Loss of energy for normal tasks. ? Feelings of guilt or worthlessness. Get help right away if:  You have serious thoughts about hurting yourself or others. If you ever feel like you may hurt yourself or others, or have thoughts about taking your own life, get help right away. You can go to your nearest emergency department or call:  Your local emergency services (911 in the [place]  A suicide crisis helpline, such as the [place] at (619)335-8503. This is open 24 hours a day. Summary  Generalized anxiety disorder (GAD) is a mental health disorder that  involves worry that is not triggered by a specific event.  People with GAD often worry excessively about many things in their lives, such as their health and family.  GAD may cause physical symptoms such as restlessness, trouble concentrating, sleep problems, frequent sweating, nausea, diarrhea, headaches, and trembling or muscle twitching.  A mental health specialist can help determine which treatment is best for you. Some people see improvement with one type of therapy. However, other people require a combination of therapies. This information is not intended to replace advice given to you by your health care provider. Make sure you discuss any questions you have with your health care provider. Document Released: 08/30/2012 Document Revised: 03/25/2016 Document Reviewed: 03/25/2016 Elsevier Interactive Patient Education  2019 Elsevier Inc.   Flank Pain, Adult Flank pain is pain that is located on the side of the body between the upper abdomen and the back. This area is called the flank. The pain may occur over a short period of time (acute), or it may be long-term or recurring (chronic). It may be mild or severe. Flank pain can be caused by many things, including:  Muscle soreness or injury.  Kidney stones or kidney disease.  Stress.  A disease of the spine (vertebral disk disease).  A lung infection (pneumonia).  Fluid around the lungs (pulmonary edema).  A skin rash caused by the chickenpox virus (shingles).  Tumors that affect the  back of the abdomen.  Gallbladder disease. Follow these instructions at home:   Drink enough fluid to keep your urine clear or pale yellow.  Rest as told by your health care provider.  Take over-the-counter and prescription medicines only as told by your health care provider.  Keep a journal to track what has caused your flank pain and what has made it feel better.  Keep all follow-up visits as told by your health care provider. This is  important. Contact a health care provider if:  Your pain is not controlled with medicine.  You have new symptoms.  Your pain gets worse.  You have a fever.  Your symptoms last longer than 2-3 days.  You have trouble urinating or you are urinating very frequently. Get help right away if:  You have trouble breathing or you are short of breath.  Your abdomen hurts or it is swollen or red.  You have nausea or vomiting.  You feel faint or you pass out.  You have blood in your urine. Summary  Flank pain is pain that is located on the side of the body between the upper abdomen and the back.  The pain may occur over a short period of time (acute), or it may be long-term or recurring (chronic). It may be mild or severe.  Flank pain can be caused by many things.  Contact your health care provider if your symptoms get worse or they last longer than 2-3 days. This information is not intended to replace advice given to you by your health care provider. Make sure you discuss any questions you have with your health care provider. Document Released: 06/26/2005 Document Revised: 07/18/2016 Document Reviewed: 07/18/2016 Elsevier Interactive Patient Education  2019 Elsevier Inc.    Chronic Back Pain When back pain lasts longer than 3 months, it is called chronic back pain.The cause of your back pain may not be known. Some common causes include:  Wear and tear (degenerative disease) of the bones, ligaments, or disks in your back.  Inflammation and stiffness in your back (arthritis). People who have chronic back pain often go through certain periods in which the pain is more intense (flare-ups). Many people can learn to manage the pain with home care. Follow these instructions at home: Pay attention to any changes in your symptoms. Take these actions to help with your pain: Activity   Avoid bending and other activities that make the problem worse.  Maintain a proper position when  standing or sitting: ? When standing, keep your upper back and neck straight, with your shoulders pulled back. Avoid slouching. ? When sitting, keep your back straight and relax your shoulders. Do not round your shoulders or pull them backward.  Do not sit or stand in one place for long periods of time.  Take brief periods of rest throughout the day. This will reduce your pain. Resting in a lying or standing position is usually better than sitting to rest.  When you are resting for longer periods, mix in some mild activity or stretching between periods of rest. This will help to prevent stiffness and pain.  Get regular exercise. Ask your health care provider what activities are safe for you.  Do not lift anything that is heavier than 10 lb (4.5 kg). Always use proper lifting technique, which includes: ? Bending your knees. ? Keeping the load close to your body. ? Avoiding twisting.  Sleep on a firm mattress in a comfortable position. Try lying on your side  with your knees slightly bent. If you lie on your back, put a pillow under your knees. Managing pain  If directed, apply ice to the painful area. Your health care provider may recommend applying ice during the first 24-48 hours after a flare-up begins. ? Put ice in a plastic bag. ? Place a towel between your skin and the bag. ? Leave the ice on for 20 minutes, 2-3 times per day.  If directed, apply heat to the affected area as often as told by your health care provider. Use the heat source that your health care provider recommends, such as a moist heat pack or a heating pad. ? Place a towel between your skin and the heat source. ? Leave the heat on for 20-30 minutes. ? Remove the heat if your skin turns bright red. This is especially important if you are unable to feel pain, heat, or cold. You may have a greater risk of getting burned.  Try soaking in a warm tub.  Take over-the-counter and prescription medicines only as told by your  health care provider.  Keep all follow-up visits as told by your health care provider. This is important. Contact a health care provider if:  You have pain that is not relieved with rest or medicine. Get help right away if:  You have weakness or numbness in one or both of your legs or feet.  You have trouble controlling your bladder or your bowels.  You have nausea or vomiting.  You have pain in your abdomen.  You have shortness of breath or you faint. This information is not intended to replace advice given to you by your health care provider. Make sure you discuss any questions you have with your health care provider. Document Released: 06/12/2004 Document Revised: 12/10/2017 Document Reviewed: 11/12/2016 Elsevier Interactive Patient Education  2019 [employer].

## 2018-05-14 NOTE — Progress Notes (Signed)
Pre visit review using our clinic review tool, if applicable. No additional management support is needed unless otherwise documented below in the visit note. 

## 2018-05-18 ENCOUNTER — Other Ambulatory Visit: Payer: Self-pay | Admitting: Internal Medicine

## 2018-05-18 DIAGNOSIS — N3000 Acute cystitis without hematuria: Secondary | ICD-10-CM

## 2018-05-18 LAB — URINE CULTURE
MICRO NUMBER: 91545068
SPECIMEN QUALITY: ADEQUATE

## 2018-05-18 LAB — URINALYSIS, ROUTINE W REFLEX MICROSCOPIC
BILIRUBIN URINE: NEGATIVE
Glucose, UA: NEGATIVE
Hgb urine dipstick: NEGATIVE
Hyaline Cast: NONE SEEN /LPF
Ketones, ur: NEGATIVE
Leukocytes, UA: NEGATIVE
NITRITE: NEGATIVE
SQUAMOUS EPITHELIAL / LPF: NONE SEEN /HPF (ref <=5)
Specific Gravity, Urine: 1.018 (ref 1.001–1.03)
pH: 5.5 (ref 5.0–8.0)

## 2018-05-18 LAB — MICROSCOPIC MESSAGE

## 2018-05-18 MED ORDER — LEVOFLOXACIN 750 MG PO TABS: 750.0000 mg | ORAL_TABLET | Freq: Every day | ORAL | 0 refills | Status: DC

## 2018-05-20 ENCOUNTER — Ambulatory Visit: Payer: Self-pay

## 2018-05-20 ENCOUNTER — Encounter: Payer: Self-pay | Admitting: *Deleted

## 2018-05-20 NOTE — Telephone Encounter (Signed)
Patient's daughter called in with c/o "congestion." She put the patient on the phone to speak to me. He says "the nasal congestion started about a month ago. I have this about the same time every year. I take some sinus pills and sometimes it will help. I have been coughing, because of the drainage. I cough up yellow sputum and when I blow my nose it's yellow. This clears up sometimes during the day, but then comes back. I don't have facial pain." I asked about the type of pills, he put his daughter on the phone while he went to get the pills. She says "he doesn't take them all the time, that's why he has to find them. The sinus pills are Claritin." I asked fever, how he's feeling, other symptoms, she says "he doesn't have a fever. He's been sluggish and says he had a sore throat yesterday." According to protocol, see PCP within 3 days, no availability with PCP. She says "just given him the first appointment with anyone." Appointment scheduled for Monday, 05/24/18 at 71 with Philis Nettle, FNP, care advice given, she verbalized understanding.  Reason for Disposition . [1] Sinus congestion (pressure, fullness) AND [2] present > 10 days  Answer Assessment - Initial Assessment Questions 1. ONSET: "When did the nasal discharge start?"      Started about 1 month ago 2. AMOUNT: "How much discharge is there?"      Average amount 3. COUGH: "Do you have a cough?" If yes, ask: "Describe the color of your sputum" (clear, white, yellow, green)     Yes, Yellow; sometimes goes away during the day 4. RESPIRATORY DISTRESS: "Describe your breathing."      Breathing is fine 5. FEVER: "Do you have a fever?" If so, ask: "What is your temperature, how was it measured, and when did it start?"     No 6. SEVERITY: "Overall, how bad are you feeling right now?" (e.g., doesn't interfere with normal activities, staying home from school/work, staying in bed)      Yes, lately felt sluggish.  7. OTHER SYMPTOMS: "Do you have any  other symptoms?" (e.g., sore throat, earache, wheezing, vomiting)     Sore throat was felt yesterday 8. PREGNANCY: "Is there any chance you are pregnant?" "When was your last menstrual period?"     N/A  Protocols used: COMMON COLD-A-AH

## 2018-05-24 ENCOUNTER — Ambulatory Visit: Payer: Medicare Other | Admitting: Family Medicine

## 2018-05-28 ENCOUNTER — Encounter: Payer: Self-pay | Admitting: Family Medicine

## 2018-05-28 ENCOUNTER — Ambulatory Visit (INDEPENDENT_AMBULATORY_CARE_PROVIDER_SITE_OTHER): Payer: Medicare Other

## 2018-05-28 ENCOUNTER — Ambulatory Visit (INDEPENDENT_AMBULATORY_CARE_PROVIDER_SITE_OTHER): Payer: Medicare Other | Admitting: Family Medicine

## 2018-05-28 VITALS — BP 122/64 | HR 62 | Temp 97.4°F | Ht 72.0 in | Wt 189.2 lb

## 2018-05-28 DIAGNOSIS — G8929 Other chronic pain: Secondary | ICD-10-CM

## 2018-05-28 DIAGNOSIS — I10 Essential (primary) hypertension: Secondary | ICD-10-CM | POA: Diagnosis not present

## 2018-05-28 DIAGNOSIS — D3500 Benign neoplasm of unspecified adrenal gland: Secondary | ICD-10-CM | POA: Diagnosis not present

## 2018-05-28 DIAGNOSIS — F32A Depression, unspecified: Secondary | ICD-10-CM

## 2018-05-28 DIAGNOSIS — R911 Solitary pulmonary nodule: Secondary | ICD-10-CM

## 2018-05-28 DIAGNOSIS — F419 Anxiety disorder, unspecified: Secondary | ICD-10-CM | POA: Diagnosis not present

## 2018-05-28 DIAGNOSIS — E039 Hypothyroidism, unspecified: Secondary | ICD-10-CM

## 2018-05-28 DIAGNOSIS — R809 Proteinuria, unspecified: Secondary | ICD-10-CM | POA: Diagnosis not present

## 2018-05-28 DIAGNOSIS — M5135 Other intervertebral disc degeneration, thoracolumbar region: Secondary | ICD-10-CM | POA: Diagnosis not present

## 2018-05-28 DIAGNOSIS — M5442 Lumbago with sciatica, left side: Secondary | ICD-10-CM

## 2018-05-28 DIAGNOSIS — F329 Major depressive disorder, single episode, unspecified: Secondary | ICD-10-CM

## 2018-05-28 DIAGNOSIS — G5603 Carpal tunnel syndrome, bilateral upper limbs: Secondary | ICD-10-CM | POA: Diagnosis not present

## 2018-05-28 LAB — BASIC METABOLIC PANEL
BUN: 17 mg/dL (ref 6–23)
CO2: 26 mEq/L (ref 19–32)
Calcium: 8.9 mg/dL (ref 8.4–10.5)
Chloride: 105 mEq/L (ref 96–112)
Creatinine, Ser: 0.93 mg/dL (ref 0.40–1.50)
GFR: 81.33 mL/min (ref >60.00)
Glucose, Bld: 87 mg/dL (ref 70–99)
Potassium: 4.1 mEq/L (ref 3.5–5.1)
Sodium: 139 mEq/L (ref 135–145)

## 2018-05-28 LAB — TSH: TSH: 3.83 u[IU]/mL (ref 0.35–4.50)

## 2018-05-28 NOTE — Patient Instructions (Signed)
Nice to see you. We will check with our clinical pharmacist regarding changing your Zoloft to an alternative medication. [facility] [place] [facility]. We will get an x-ray of your low back today.  We will contact you with the results. We will recheck your urine today as there was a small amount of protein in it.  We will contact you with those results. We will get lab work today and contact you with the results. If you develop recurrent urinary symptoms or low back pain please be reevaluated.

## 2018-05-30 ENCOUNTER — Telehealth: Payer: Self-pay | Admitting: Family Medicine

## 2018-05-30 DIAGNOSIS — G5600 Carpal tunnel syndrome, unspecified upper limb: Secondary | ICD-10-CM | POA: Insufficient documentation

## 2018-05-30 DIAGNOSIS — R809 Proteinuria, unspecified: Secondary | ICD-10-CM | POA: Insufficient documentation

## 2018-05-30 NOTE — Telephone Encounter (Signed)
This patient was seen on Friday.  He was to have a urinalysis though it does not appear this was completed.  Please contact him to see if he can come back and provide [place] a urine sample.  Thanks.

## 2018-05-30 NOTE — Assessment & Plan Note (Signed)
Status post release on the left.  He will continue to follow with orthopedics.

## 2018-05-30 NOTE — Assessment & Plan Note (Signed)
Adequately controlled.  Continue current regimen. 

## 2018-05-30 NOTE — Assessment & Plan Note (Signed)
Patient has a follow-up scan scheduled.  They will make sure we get a copy of the results.

## 2018-05-30 NOTE — Assessment & Plan Note (Signed)
Patient has a repeat scan scheduled.  He will make sure we get the records.

## 2018-05-30 NOTE — Assessment & Plan Note (Signed)
Noted on previous urinalysis.  We will recheck this.  UTI symptoms have resolved.

## 2018-05-30 NOTE — Assessment & Plan Note (Signed)
Intermittent symptoms.  We will check with our clinical pharmacist on the best way to transition him to an alternative medication.

## 2018-05-30 NOTE — Progress Notes (Signed)
[name], MD Phone: [phone]  Allen David is a 85 y.o. male who presents today for follow-up.  CC: Anxiety/depression, adrenal adenoma, lung nodule, hypertension, carpal tunnel, UTI, chronic back pain  Anxiety/depression: Patient notes these things come and go.  Sometimes he feels fine and other times he has issues with this.  He increased his Zoloft on his own to 150 mg daily.  He has been on this for about 6 weeks.  He denies SI.  He does take Xanax typically once daily.  No alcohol use with this.  Adrenal adenoma/lung nodule: Patient has repeat scans ordered through the [place].  These are scheduled for February 20.  They will make sure we get a copy of these results.  Hypertension: Typically running 126-134/70s.  Taking amlodipine.  No chest pain, shortness of breath, or edema.  Carpal tunnel syndrome: Patient had surgery on his left wrist for this.  He notes the pain has improved quite a bit.  He is going to have surgery on his right wrist for this.  He continues to follow with orthopedics for this.  UTI: Patient was treated with Levaquin.  He notes his dysuria went away.  He notes no frequency or recurrent dysuria.  He does note mild issues starting and stopping with his urinary stream first thing in the morning though that resolves as the day goes on.  No straining.  He feels empty with urination.  No nocturia.  Chronic back pain: Patient notes occasionally bothers him a little in his low back.  Rarely has sciatica down his left leg.  He reports prior imaging revealed inflammation.  He had been on a muscle relaxer previously.  No numbness.  He reports he occasionally feels weak in his bilateral legs.  No saddle anesthesia or incontinence.  Social History   Tobacco Use  Smoking Status Former Smoker  . Last attempt to quit: 05/19/1966  . Years since quitting: 52.0  Smokeless Tobacco Former Systems developer  Tobacco Comment   Quit in [place] see history of present  illness  Objective  Physical Exam Vitals:   05/28/18 0959  BP: 122/64  Pulse: 62  Temp: (!) 97.4 F (36.3 C)  SpO2: 99%    BP Readings from Last 3 Encounters:  05/28/18 122/64  05/14/18 132/80  01/05/18 110/60   Wt Readings from Last 3 Encounters:  05/28/18 189 lb 3.2 oz (85.8 kg)  05/14/18 185 lb 6.4 oz (84.1 kg)  01/05/18 190 lb 4 oz (86.3 kg)    Physical Exam Constitutional:      General: He is not in acute distress.    Appearance: He is not diaphoretic.  Cardiovascular:     Rate and Rhythm: Normal rate and regular rhythm.     Heart sounds: Normal heart sounds.  Pulmonary:     Effort: Pulmonary effort is normal.     Breath sounds: Normal breath sounds.  Musculoskeletal:     Comments: No midline spine tenderness, no midline spine step-off, no muscular back tenderness  Skin:    General: Skin is warm and dry.  Neurological:     Mental Status: He is alert.     Comments: 5/5 strength bilateral quads, hamstrings, plantar flexion, and dorsiflexion, sensation light touch intact bilateral lower extremities, 2+ patellar reflexes      Assessment/Plan: Please see individual problem list.  Hypertension Adequately controlled.  Continue current regimen.  Adrenal adenoma Patient has a follow-up scan scheduled.  They will make sure we  get a copy of the results.  Anxiety and depression Intermittent symptoms.  We will check with our clinical pharmacist on the best way to transition him to an alternative medication.  Incidental lung nodule, > 19mm and < 87mm Patient has a repeat scan scheduled.  He will make sure we get the records.  Chronic low back pain Chronic issue.  Patient is interested in a x-ray today.  This was ordered.  He will monitor.  Proteinuria Noted on previous urinalysis.  We will recheck this.  UTI symptoms have resolved.  Carpal tunnel syndrome Status post release on the left.  He will continue to follow with orthopedics.   Orders Placed This  Encounter  Procedures  . DG Lumbar Spine Complete    Standing Status:   Future    Number of Occurrences:   1    Standing Expiration Date:   07/27/2019    Order Specific Question:   Reason for Exam (SYMPTOM  OR DIAGNOSIS REQUIRED)    Answer:   chronic low back pain with left sided sciatica    Order Specific Question:   Preferred imaging location?    Answer:   [facility] Specific Question:   Radiology Contrast Protocol - do NOT remove file path    Answer:   [email or web address]  . Basic Metabolic Panel (BMET)  . TSH  . POCT Urinalysis Dipstick    Standing Status:   Future    Standing Expiration Date:   07/29/2018    No orders of the defined types were placed in this encounter.  Urine was not provided during this office visit.  I will have the CMA contact the patient to get him set up for a urinalysis in the lab.  [name], MD [place]

## 2018-05-30 NOTE — Assessment & Plan Note (Signed)
Chronic issue.  Patient is interested in a x-ray today.  This was ordered.  He will monitor.

## 2018-06-01 ENCOUNTER — Other Ambulatory Visit (INDEPENDENT_AMBULATORY_CARE_PROVIDER_SITE_OTHER): Payer: Medicare Other

## 2018-06-01 DIAGNOSIS — R809 Proteinuria, unspecified: Secondary | ICD-10-CM | POA: Diagnosis not present

## 2018-06-01 LAB — POCT URINALYSIS DIPSTICK
Bilirubin, UA: NEGATIVE
Glucose, UA: NEGATIVE
Ketones, UA: NEGATIVE
Leukocytes, UA: NEGATIVE
Nitrite, UA: NEGATIVE
Protein, UA: NEGATIVE
RBC UA: NEGATIVE
Spec Grav, UA: 1.02 (ref 1.010–1.025)
Urobilinogen, UA: 2 E.U./dL — AB
pH, UA: 6 (ref 5.0–8.0)

## 2018-06-01 NOTE — Telephone Encounter (Signed)
Called and spoke with daughter daughter is on her way now to drop off urine. Sent to [place] as an [place].

## 2018-06-03 ENCOUNTER — Telehealth: Payer: Self-pay | Admitting: Family Medicine

## 2018-06-03 NOTE — Telephone Encounter (Signed)
Please let the patient know I heard back from our clinical pharmacist regarding his Zoloft.  She advised decreasing Zoloft to 100 mg daily and starting duloxetine 30 mg daily for 1 week.  Then we would decrease Zoloft to 50 mg daily and increase duloxetine to 60 mg daily for a week, then we would stop the Zoloft and continue the duloxetine.  If he is willing to start this I can send the duloxetine to his pharmacy.  Thanks.

## 2018-06-03 NOTE — Telephone Encounter (Signed)
Called and spoke with pt's daughter. Daughter stated that her father is coming into the office tomorrow for lab work. She is requesting written copy of how to taper to the new medication plus a written script to take to the [place] if needed. Could you get this done so pt can pick this up tomorrow?

## 2018-06-03 NOTE — Telephone Encounter (Signed)
-----   Message from De Hollingshead, [place] sent at 05/30/2018  4:29 PM EST ----- Hey -  I agree, duloxetine would be a fantastic option. Since the sertraline is such a high dose, I'd decrease to 100 mg daily while starting duloxetine 30 mg for 1 week, then decrease sertraline to 50 mg daily and increase duloxetine to 60 mg daily for a week, then stop sertraline and continue duloxetine at 60 mg.daily.  Catie ----- Message ----- From: Leone Haven, MD Sent: 05/30/2018  10:52 AM EST To: De Hollingshead, RPH  Hi Catie,   This patient is on zoloft 150 mg daily and has not noticed a benefit for his anxiety or depression. I would like to switch him to something different and wanted to get your input to see what you thought might be best given his prior issue with lexapro (HTN). Would cymbalta be an ok option given his chronic issues with pain? If so, would a direct switch from zoloft to cymbalta be ok or would we need to taper down on the zoloft first? Thanks for your help.  Randall Hiss

## 2018-06-04 ENCOUNTER — Telehealth: Payer: Self-pay | Admitting: Radiology

## 2018-06-04 ENCOUNTER — Other Ambulatory Visit: Payer: Self-pay | Admitting: Family Medicine

## 2018-06-04 ENCOUNTER — Other Ambulatory Visit (INDEPENDENT_AMBULATORY_CARE_PROVIDER_SITE_OTHER): Payer: Medicare Other

## 2018-06-04 DIAGNOSIS — E785 Hyperlipidemia, unspecified: Secondary | ICD-10-CM

## 2018-06-04 LAB — HEPATIC FUNCTION PANEL
ALT: 16 U/L (ref 0–53)
AST: 28 U/L (ref 0–37)
Albumin: 4 g/dL (ref 3.5–5.2)
Alkaline Phosphatase: 48 U/L (ref 39–117)
Bilirubin, Direct: 0.1 mg/dL (ref 0.0–0.3)
Total Bilirubin: 0.5 mg/dL (ref 0.2–1.2)
Total Protein: 7.5 g/dL (ref 6.0–8.3)

## 2018-06-04 MED ORDER — DULOXETINE HCL 30 MG PO CPEP: ORAL_CAPSULE | ORAL | 3 refills | Status: DC

## 2018-06-04 NOTE — Telephone Encounter (Signed)
Copied from [place]. Topic: Quick Communication - Rx Refill/Question >> Jun 04, 2018  3:13 PM Windy Kalata wrote: Medication: DULoxetine (CYMBALTA) 30 MG capsule [[id number]]   Has the patient contacted their pharmacy? No. (Agent: If no, request that the patient contact the pharmacy for the refill.) (Agent: If yes, when and what did the pharmacy advise?) Daughter called asking if this medication can be called in, they received a hard copy to give to the [place] doctor, please advise  Preferred Pharmacy (with phone number or street name): [facility] [address] [place] - [place] [phone] (Phone) [phone] (Fax)    Agent: Please be advised that RX refills may take up to 3 business days. We ask that you follow-up with your pharmacy.

## 2018-06-04 NOTE — Telephone Encounter (Addendum)
Completed. Give to [place].

## 2018-06-04 NOTE — Telephone Encounter (Signed)
Ordered

## 2018-06-04 NOTE — Telephone Encounter (Signed)
Patient only need liver function tests completed today.

## 2018-06-04 NOTE — Addendum Note (Signed)
Addended by: Leone Haven on: 06/04/2018 02:57 PM   Modules accepted: Orders

## 2018-06-04 NOTE — Telephone Encounter (Signed)
Directions & Rx was given to patient & daughter while they were in the office for lab work today.

## 2018-06-04 NOTE — Telephone Encounter (Signed)
Pt coming in for labs today , please place future orders. Thank you.  

## 2018-06-04 NOTE — Telephone Encounter (Signed)
Pt came in for labs today and stated he thought he was suppose to get a urine specimen today as well. Advised pt I will ask PCP if he needed it. Pt provided urine specimen just incase.

## 2018-06-07 ENCOUNTER — Telehealth: Payer: Self-pay | Admitting: *Deleted

## 2018-06-07 ENCOUNTER — Other Ambulatory Visit: Payer: Self-pay

## 2018-06-07 MED ORDER — DULOXETINE HCL 30 MG PO CPEP: ORAL_CAPSULE | ORAL | 0 refills | Status: DC

## 2018-06-07 NOTE — Telephone Encounter (Signed)
Rx was sent to pharmacy. Called patient and left a detailed VM that 30 day supply was sent to [facility].

## 2018-06-07 NOTE — Telephone Encounter (Signed)
Copied from [place]. Topic: General - Other >> Jun 04, 2018  4:58 PM Wynetta Emery, [place] C wrote: Reason for CRM: Medication DULoxetine (CYMBALTA) 30 MG capsule, pt would like to have a 30 day sent in to the local pharmacy also.    Pharmacy: [facility] [address] [place]

## 2018-06-07 NOTE — Telephone Encounter (Signed)
Pt calling to check status. Please advise  °

## 2018-06-07 NOTE — Telephone Encounter (Signed)
Rx has been sent  

## 2018-07-06 ENCOUNTER — Telehealth: Payer: Self-pay

## 2018-07-06 NOTE — Telephone Encounter (Signed)
Copied from [place] [id number]. Topic: General - Other >> Jul 06, 2018  4:04 PM Yvette Rack wrote: Reason for CRM: [facility] with the reason pt was changed from Zoloft to DULoxetine (CYMBALTA) 30 MG capsule. Please forward progress notes and Rx to Dr. Theda Sers at fax# [phone]. Cb# [phone]

## 2018-07-06 NOTE — Telephone Encounter (Signed)
I believe they will need to fax a records request or have the patient come in an sign a record release. Thanks.

## 2018-07-06 NOTE — Telephone Encounter (Signed)
It is ok to fax the prior notes.

## 2018-07-06 NOTE — Telephone Encounter (Signed)
Sent to PCP for the OK to fax this information.

## 2018-07-07 NOTE — Telephone Encounter (Signed)
Called pt's daughter and was unable to leave a VM.

## 2018-07-07 NOTE — Telephone Encounter (Signed)
Progress notes have been faxed.

## 2018-07-13 NOTE — Telephone Encounter (Signed)
Patient's daughter called to get the status of his request for progress notes to be faxed to Dr. Theda Sers (Fax# [phone])  Patient's call back # if [phone]

## 2018-07-16 NOTE — Telephone Encounter (Signed)
Called and informed daughter the notes were faxed, daughter states she will be down with pt to sign a release of records  Next week.  Gae Bon, CMA

## 2018-07-23 NOTE — Telephone Encounter (Signed)
Patient's daughter needs the progress notes faxed to the [place] as to why the medication was changed from Zoloft to Cymbalta? Attn:: Arnoldo Morale & Dr Theda Sers at [phone], they never received it on the other fax number. Also a written script for this.

## 2018-07-27 NOTE — Telephone Encounter (Signed)
Sent to be faxed.  

## 2018-07-27 NOTE — Telephone Encounter (Signed)
Sent to PCP please sign written script to fax to [facility] thanks.

## 2018-07-27 NOTE — Telephone Encounter (Signed)
Placed on your desk please sign written orders thanks

## 2018-07-27 NOTE — Telephone Encounter (Signed)
Signed.

## 2018-07-27 NOTE — Telephone Encounter (Signed)
Notes and OV notes have been faxed to the [place].

## 2018-09-20 ENCOUNTER — Telehealth: Payer: Self-pay | Admitting: Family Medicine

## 2018-09-20 ENCOUNTER — Other Ambulatory Visit: Payer: Self-pay

## 2018-09-20 ENCOUNTER — Encounter: Payer: Self-pay | Admitting: Family Medicine

## 2018-09-20 ENCOUNTER — Ambulatory Visit (INDEPENDENT_AMBULATORY_CARE_PROVIDER_SITE_OTHER): Payer: Medicare Other | Admitting: Family Medicine

## 2018-09-20 DIAGNOSIS — H9190 Unspecified hearing loss, unspecified ear: Secondary | ICD-10-CM | POA: Insufficient documentation

## 2018-09-20 DIAGNOSIS — M79605 Pain in left leg: Secondary | ICD-10-CM

## 2018-09-20 DIAGNOSIS — I10 Essential (primary) hypertension: Secondary | ICD-10-CM

## 2018-09-20 DIAGNOSIS — F419 Anxiety disorder, unspecified: Secondary | ICD-10-CM | POA: Diagnosis not present

## 2018-09-20 DIAGNOSIS — D3500 Benign neoplasm of unspecified adrenal gland: Secondary | ICD-10-CM | POA: Diagnosis not present

## 2018-09-20 DIAGNOSIS — H9193 Unspecified hearing loss, bilateral: Secondary | ICD-10-CM

## 2018-09-20 DIAGNOSIS — M79606 Pain in leg, unspecified: Secondary | ICD-10-CM | POA: Insufficient documentation

## 2018-09-20 DIAGNOSIS — M79604 Pain in right leg: Secondary | ICD-10-CM | POA: Diagnosis not present

## 2018-09-20 DIAGNOSIS — F329 Major depressive disorder, single episode, unspecified: Secondary | ICD-10-CM | POA: Diagnosis not present

## 2018-09-20 DIAGNOSIS — R911 Solitary pulmonary nodule: Secondary | ICD-10-CM

## 2018-09-20 NOTE — Assessment & Plan Note (Signed)
Patient reports having had a scan completed for this.  We will determine the best way to have him sign a release of information given COVID-19 restrictions.  We will contact him once we have determined this.  Phone message sent to clinic RN.

## 2018-09-20 NOTE — Progress Notes (Signed)
Virtual Visit via video Note  This visit type was conducted due to national recommendations for restrictions regarding the COVID-19 pandemic (e.g. social distancing).  This format is felt to be most appropriate for this patient at this time.  All issues noted in this document were discussed and addressed.  No physical exam was performed (except for noted visual exam findings with Video Visits).   I connected with Allen David on 09/20/18 at 10:30 AM EDT by a video enabled telemedicine application or telephone and verified that I am speaking with the correct person using two identifiers. Location patient: home Location provider: work Persons participating in the virtual visit: patient, provider, daughter Allen David)  I discussed the limitations, risks, security and privacy concerns of performing an evaluation and management service by telephone and the availability of in person appointments. I also discussed with the patient that there may be a patient responsible charge related to this service. The patient expressed understanding and agreed to proceed.  Reason for visit: follow-up  HPI: Hypertension: Notes today it was 134/72.  Pulse 62.  Taking amlodipine.  No chest pain, shortness of breath, or edema.  Difficulty hearing: The patient's daughter notes he has a hard time hearing.  Notes this has been going on for some time.  He denies tinnitus, ear pain, or ear fullness.  Leg tiredness: Patient notes that he feels as though his legs get tired and feel as though they are going to give out on him when he walks.  He does note some pain in the posterior aspect of his legs bilaterally.  This has been going on for least a year.  No falls related to this.  He does have chronic back issues and has had sciatica though that is different than these symptoms.  No numbness.  No incontinence.  Adrenal adenoma: Patient had follow-up CT scans on this and a pulmonary nodule.  He needs to have a release of  information signed for [place] to request records from the [place].  Anxiety/depression: Patient notes at times he will have some anxiety and some depression depending on the day.  Notes the depression is typically related to the COVID-19 issues that are in society.  He notes he will get his mind off of it by going out on his boat and that helps.  He is on Cymbalta 60 mg daily.  He has been on this for 1 month and that has been beneficial.  He takes half a Xanax at times and that does help though he only takes this if he really needs it.  No drowsiness with this.  No SI.   ROS: See pertinent positives and negatives per HPI.  Past Medical History:  Diagnosis Date  . Anxiety   . Back injury    with chronic pain  . CAD in native artery   . Chronic low back pain   . Depression   . ED (erectile dysfunction)   . Elevated PSA   . HLD (hyperlipidemia)   . HTN (hypertension)   . IBS (irritable bowel syndrome)   . Malaise and fatigue   . Pernicious anemia     Past Surgical History:  Procedure Laterality Date  . EYE SURGERY    . VASECTOMY      Family History  Problem Relation Age of Onset  . Stroke Mother   . Hypertension Mother   . Ulcers Mother   . Cancer Father        multiple myeloma  . Diabetes  Sister   . Diabetes Brother   . Diabetes Brother     SOCIAL HX: Former smoker.   Current Outpatient Medications:  .  Acetaminophen (TYLENOL ARTHRITIS PAIN PO), Take 1 tablet by mouth as needed. , Disp: , Rfl:  .  ALPRAZolam (XANAX) 1 MG tablet, Take 1 mg by mouth 4 (four) times daily as needed for sleep., Disp: , Rfl:  .  amLODipine (NORVASC) 5 MG tablet, Take 1 tablet (5 mg total) by mouth daily., Disp: 90 tablet, Rfl: 3 .  aspirin (ASPIRIN 81) 81 MG EC tablet, Take 1 tablet (81 mg total) by mouth daily., Disp: 30 tablet, Rfl: 6 .  atorvastatin (LIPITOR) 20 MG tablet, Take 1 tablet (20 mg total) by mouth daily., Disp: 90 tablet, Rfl: 3 .  Clobetasol Prop Emollient Base (CLOBETASOL  PROPIONATE E) 0.05 % emollient cream, Apply 0.05 % topically 2 (two) times daily., Disp: , Rfl:  .  DULoxetine (CYMBALTA) 30 MG capsule, Take 30 mg (1 capsule) by mouth daily for 7 days, then take 60 mg (2 capsules) by mouth daily, Disp: 53 capsule, Rfl: 0 .  levothyroxine (SYNTHROID, LEVOTHROID) 25 MCG tablet, Take 1 tablet (25 mcg total) by mouth daily before breakfast., Disp: , Rfl:  .  Multiple Vitamin (ONE-A-DAY MENS PO), Take 1 tablet by mouth daily. , Disp: , Rfl:   Current Facility-Administered Medications:  .  betamethasone acetate-betamethasone sodium phosphate (CELESTONE) injection 3 mg, 3 mg, Intramuscular, Once, Evans, Dorathy Daft, DPM  EXAM:  VITALS per patient if applicable: None.  GENERAL: alert, oriented, appears well and in no acute distress  HEENT: atraumatic, conjunttiva clear, no obvious abnormalities on inspection of external nose and ears  NECK: normal movements of the head and neck  LUNGS: on inspection no signs of respiratory distress, breathing rate appears normal, no obvious gross SOB, gasping or wheezing  CV: no obvious cyanosis  MS: moves all visible extremities without noticeable abnormality  PSYCH/NEURO: pleasant and cooperative, no obvious depression or anxiety, speech and thought processing grossly intact  ASSESSMENT AND PLAN:  Discussed the following assessment and plan:  Essential hypertension  Hearing difficulty of both ears - Plan: Ambulatory referral to ENT  Adrenal adenoma, unspecified laterality  Incidental lung nodule, > 9m and < 841m Pain in both lower extremities - Plan: [place] ABI (SCREENING LOWER EXTREMITY)  Anxiety and depression  Hypertension Adequately controlled.  Continue current medication.  Difficulty hearing Refer to ENT.  Adrenal adenoma Patient reports having had a scan completed for this.  We will determine the best way to have him sign a release of information given COVID-19 restrictions.  We will contact him  once we have determined this.  Phone message sent to clinic RN.  Incidental lung nodule, > 23m61mnd < 8mm67mtient reports having had a scan completed for this.  We will determine the best way to have him sign a release of information given COVID-19 restrictions.  We will contact him once we have determined this.  Phone message sent to clinic RN.  Leg pain This could possibly represent claudication.  We will obtain ABIs.  If these are negative I would consider evaluation of his back.  Also discussed possibly completing physical therapy for this.  Anxiety and depression Some intermittent symptoms given what is going on in the world currently.  He will continue Cymbalta.  I discussed he may have additional benefit from the Cymbalta over the next month.  He will monitor.  Social distancing precautions  and sick precautions given regarding COVID-19.   I discussed the assessment and treatment plan with the patient. The patient was provided an opportunity to ask questions and all were answered. The patient agreed with the plan and demonstrated an understanding of the instructions.   The patient was advised to call back or seek an in-person evaluation if the symptoms worsen or if the condition fails to improve as anticipated.   [name], MD

## 2018-09-20 NOTE — Telephone Encounter (Signed)
Called pt and left a VM to call back to schedule pt for an appt in 4 months.

## 2018-09-20 NOTE — Telephone Encounter (Signed)
They could be mailed as long as the patient and his daughter are ok with that. If they would prefer we could have him come in and sign them from the car.

## 2018-09-20 NOTE — Assessment & Plan Note (Signed)
Refer to ENT

## 2018-09-20 NOTE — Assessment & Plan Note (Signed)
Some intermittent symptoms given what is going on in the world currently.  He will continue Cymbalta.  I discussed he may have additional benefit from the Cymbalta over the next month.  He will monitor.

## 2018-09-20 NOTE — Assessment & Plan Note (Signed)
This could possibly represent claudication.  We will obtain ABIs.  If these are negative I would consider evaluation of his back.  Also discussed possibly completing physical therapy for this.

## 2018-09-20 NOTE — Assessment & Plan Note (Signed)
Adequately controlled.  Continue current medication. 

## 2018-09-20 NOTE — Telephone Encounter (Signed)
We could do either or we could mail release if you do not need records ASAP.

## 2018-09-20 NOTE — Telephone Encounter (Signed)
Please contact the patient and get him set up for follow-up in 4 months.  I will also forward to [facility] to determine the best way to have this patient come to sign a release of information.  We need him to sign this to get records from the [place].  Would it be best for him to come in and have somebody go out to have him sign it in the parking lot or to have him come in to the waiting room to have this signed?

## 2018-09-21 NOTE — Telephone Encounter (Signed)
Called pt and left a detailed VM to call back. CRM created and sent to PCP pool.

## 2018-09-22 NOTE — Telephone Encounter (Signed)
Pt called back and stated that his daughter will come by to pick this up. Placed up front.

## 2018-09-27 ENCOUNTER — Encounter (INDEPENDENT_AMBULATORY_CARE_PROVIDER_SITE_OTHER): Payer: Medicare Other

## 2018-10-29 ENCOUNTER — Telehealth: Payer: Self-pay | Admitting: Family Medicine

## 2018-10-29 ENCOUNTER — Ambulatory Visit (INDEPENDENT_AMBULATORY_CARE_PROVIDER_SITE_OTHER): Payer: Medicare Other | Admitting: Family Medicine

## 2018-10-29 ENCOUNTER — Other Ambulatory Visit: Payer: Self-pay

## 2018-10-29 DIAGNOSIS — H9190 Unspecified hearing loss, unspecified ear: Secondary | ICD-10-CM | POA: Diagnosis not present

## 2018-10-29 DIAGNOSIS — E538 Deficiency of other specified B group vitamins: Secondary | ICD-10-CM

## 2018-10-29 DIAGNOSIS — R29898 Other symptoms and signs involving the musculoskeletal system: Secondary | ICD-10-CM

## 2018-10-29 DIAGNOSIS — G8929 Other chronic pain: Secondary | ICD-10-CM

## 2018-10-29 DIAGNOSIS — R5383 Other fatigue: Secondary | ICD-10-CM

## 2018-10-29 DIAGNOSIS — M5442 Lumbago with sciatica, left side: Secondary | ICD-10-CM

## 2018-10-29 NOTE — Telephone Encounter (Signed)
Allen David -- can his vascular study be rescheduled, ordered by Dr Caryl Bis May 2020    Allen David -- Can we call his daughter [phone] to set up in office appt for follow up with Dr Caryl Bis or me and also we will plan to do labs that day   Thanks!  LG

## 2018-10-29 NOTE — Telephone Encounter (Signed)
I called Pt daughter,  I was able to schedule Pt with Dr Caryl Bis on 11/03/18 @ 8:00am OV F/U appt

## 2018-10-29 NOTE — Progress Notes (Signed)
Patient ID: Allen David, male   DOB: [birth year], 85 y.o.   MRN: [id number]    Virtual Visit via phone Note  This visit type was conducted due to national recommendations for restrictions regarding the COVID-19 pandemic (e.g. social distancing).  This format is felt to be most appropriate for this patient at this time.  All issues noted in this document were discussed and addressed.  No physical exam was performed (except for noted visual exam findings with Video Visits).   I connected with Rachael Fee & daughter today at 11:00 AM EDT by telephone and verified that I am speaking with the correct person using two identifiers. Location patient: home Location provider: [place] Persons participating in the virtual visit: patient, provider  I discussed the limitations, risks, security and privacy concerns of performing an evaluation and management service by telephone and the availability of in person appointments. I also discussed with the patient that there may be a patient responsible charge related to this service. The patient expressed understanding and agreed to proceed.  Interactive audio and video telecommunications were attempted between this provider and patient, however failed, due to patient having technical difficulties, completed visit over phone.    HPI:   Patient, daughter and I connected via phone due to complaints of muscle pain and weakness in legs that is been going on for over 6 months.  Patient has had x-ray imaging of low back, I was able to review this x-ray and it does show degenerative disc disease in the lumbar spine.  Patient also had a visit with Dr. Caryl Bis in May 2020 and due to complaints of leg weakness a vascular imaging study was ordered to look for claudication however per daughter and patient they had a lot going on during the month of May due to house construction and missed the appointment.  Daughter also requests referral back to ear nose throat  for evaluation of hearing aids for her father, states at first he was resistant to getting hearing aids but now seems more open to it.  Patient also complains of generalized feelings of fatigue and wonders if it is related to either B12 deficiency or his blood pressure being on the lower end at times, states BP sometimes will be in 110s/60s.  Patient and daughter deny any fever or chills, denies cough, shortness of breath or wheezing.  Denies chest pain.  Denies saddle anesthesia or loss of bowel or bladder control.  Denies any GU complaints or GI complaints.    ROS: See pertinent positives and negatives per HPI.  Past Medical History:  Diagnosis Date  . Anxiety   . Back injury    with chronic pain  . CAD in native artery   . Chronic low back pain   . Depression   . ED (erectile dysfunction)   . Elevated PSA   . HLD (hyperlipidemia)   . HTN (hypertension)   . IBS (irritable bowel syndrome)   . Malaise and fatigue   . Pernicious anemia     Past Surgical History:  Procedure Laterality Date  . EYE SURGERY    . VASECTOMY      Family History  Problem Relation Age of Onset  . Stroke Mother   . Hypertension Mother   . Ulcers Mother   . Cancer Father        multiple myeloma  . Diabetes Sister   . Diabetes Brother   . Diabetes Brother    Social History   Tobacco  Use  . Smoking status: Former Smoker    Quit date: 05/19/1966    Years since quitting: 52.4  . Smokeless tobacco: Former Systems developer  . Tobacco comment: Quit in 1968  Substance Use Topics  . Alcohol use: Yes    Alcohol/week: 0.0 - 1.0 standard drinks    Comment: daily    Current Outpatient Medications:  .  Acetaminophen (TYLENOL ARTHRITIS PAIN PO), Take 1 tablet by mouth as needed. , Disp: , Rfl:  .  ALPRAZolam (XANAX) 1 MG tablet, Take 1 mg by mouth 4 (four) times daily as needed for sleep., Disp: , Rfl:  .  amLODipine (NORVASC) 5 MG tablet, Take 1 tablet (5 mg total) by mouth daily., Disp: 90 tablet, Rfl: 3 .   aspirin (ASPIRIN 81) 81 MG EC tablet, Take 1 tablet (81 mg total) by mouth daily., Disp: 30 tablet, Rfl: 6 .  atorvastatin (LIPITOR) 20 MG tablet, Take 1 tablet (20 mg total) by mouth daily., Disp: 90 tablet, Rfl: 3 .  Clobetasol Prop Emollient Base (CLOBETASOL PROPIONATE E) 0.05 % emollient cream, Apply 0.05 % topically 2 (two) times daily., Disp: , Rfl:  .  DULoxetine (CYMBALTA) 30 MG capsule, Take 30 mg (1 capsule) by mouth daily for 7 days, then take 60 mg (2 capsules) by mouth daily, Disp: 53 capsule, Rfl: 0 .  levothyroxine (SYNTHROID, LEVOTHROID) 25 MCG tablet, Take 1 tablet (25 mcg total) by mouth daily before breakfast., Disp: , Rfl:  .  Multiple Vitamin (ONE-A-DAY MENS PO), Take 1 tablet by mouth daily. , Disp: , Rfl:   Current Facility-Administered Medications:  .  betamethasone acetate-betamethasone sodium phosphate (CELESTONE) injection 3 mg, 3 mg, Intramuscular, Once, Evans, Dorathy Daft, DPM  EXAM:  GENERAL: alert, oriented, sounds well and in no acute distress  LUNGS: Speaking in full sentences. no signs of respiratory distress, breathing rate appears normal, no obvious gross SOB, gasping, coughin or wheezing  PSYCH/NEURO: pleasant and cooperative, no obvious depression or anxiety, speech and thought processing grossly intact  ASSESSMENT AND PLAN:  Discussed the following assessment and plan:  1. B12 deficiency Will get labs to see if he needs B12 replacement - CBC w/Diff; Future - B12; Future  2. Fatigue, unspecified type Will get labs to further investigate - CBC w/Diff; Future - TSH; Future - T3; Future - T4; Future - Comp Met (CMET); Future  3. Chronic bilateral low back pain with left-sided sciatica Will refer to PT and spine surgery to see if he is a candidate for any sort of spinal injection to help pain.  Discussed with patient he can use Tylenol as needed for pain and also can use topical things like BenGay or Biofreeze as well as a heating pad on low back if  needed.  - Ambulatory referral to Physical Therapy - Ambulatory referral to Spine Surgery  4. Hearing loss, unspecified hearing loss type, unspecified laterality  - Ambulatory referral to ENT  5. Weakness of both lower extremities Discussed with patient that this could be from multiple different things and/or combination of things.  We will reschedule vascular study to look for claudication and also I am hopeful physical therapy will help improve strength in lower extremities.    I discussed the assessment and treatment plan with the patient. The patient was provided an opportunity to ask questions and all were answered. The patient agreed with the plan and demonstrated an understanding of the instructions.   The patient was advised to call back or seek an in-person  evaluation if the symptoms worsen or if the condition fails to improve as anticipated.  I provided 30 minutes of non-face-to-face time over phone with patient and daughter during this encounter.  Patient will come into office for in office appt so we can check BP, do physical exam and check labs.   Jodelle Green, FNP

## 2018-11-01 ENCOUNTER — Other Ambulatory Visit: Payer: Self-pay

## 2018-11-03 ENCOUNTER — Other Ambulatory Visit: Payer: Self-pay

## 2018-11-03 ENCOUNTER — Ambulatory Visit (INDEPENDENT_AMBULATORY_CARE_PROVIDER_SITE_OTHER): Payer: Medicare Other | Admitting: Family Medicine

## 2018-11-03 ENCOUNTER — Encounter: Payer: Self-pay | Admitting: Family Medicine

## 2018-11-03 VITALS — BP 120/50 | HR 84 | Temp 97.9°F | Ht 69.0 in | Wt 183.4 lb

## 2018-11-03 DIAGNOSIS — F32A Depression, unspecified: Secondary | ICD-10-CM

## 2018-11-03 DIAGNOSIS — F329 Major depressive disorder, single episode, unspecified: Secondary | ICD-10-CM | POA: Diagnosis not present

## 2018-11-03 DIAGNOSIS — R252 Cramp and spasm: Secondary | ICD-10-CM

## 2018-11-03 DIAGNOSIS — I10 Essential (primary) hypertension: Secondary | ICD-10-CM

## 2018-11-03 DIAGNOSIS — M5442 Lumbago with sciatica, left side: Secondary | ICD-10-CM | POA: Diagnosis not present

## 2018-11-03 DIAGNOSIS — G8929 Other chronic pain: Secondary | ICD-10-CM

## 2018-11-03 DIAGNOSIS — E538 Deficiency of other specified B group vitamins: Secondary | ICD-10-CM

## 2018-11-03 DIAGNOSIS — D3500 Benign neoplasm of unspecified adrenal gland: Secondary | ICD-10-CM | POA: Diagnosis not present

## 2018-11-03 DIAGNOSIS — F419 Anxiety disorder, unspecified: Secondary | ICD-10-CM | POA: Diagnosis not present

## 2018-11-03 DIAGNOSIS — R5383 Other fatigue: Secondary | ICD-10-CM

## 2018-11-03 LAB — CBC WITH DIFFERENTIAL/PLATELET
Basophils Absolute: 0 10*3/uL (ref 0.0–0.1)
Basophils Relative: 0.7 % (ref 0.0–3.0)
Eosinophils Absolute: 0.1 10*3/uL (ref 0.0–0.7)
Eosinophils Relative: 2 % (ref 0.0–5.0)
HCT: 32 % — ABNORMAL LOW (ref 39.0–52.0)
Hemoglobin: 10.6 g/dL — ABNORMAL LOW (ref 13.0–17.0)
Lymphocytes Relative: 36.9 % (ref 12.0–46.0)
Lymphs Abs: 1.6 10*3/uL (ref 0.7–4.0)
MCHC: 33.2 g/dL (ref 30.0–36.0)
MCV: 90 fl (ref 78.0–100.0)
Monocytes Absolute: 0.4 10*3/uL (ref 0.1–1.0)
Monocytes Relative: 8.4 % (ref 3.0–12.0)
Neutro Abs: 2.3 10*3/uL (ref 1.4–7.7)
Neutrophils Relative %: 52 % (ref 43.0–77.0)
Platelets: 131 10*3/uL — ABNORMAL LOW (ref 150.0–400.0)
RBC: 3.55 Mil/uL — ABNORMAL LOW (ref 4.22–5.81)
RDW: 16.4 % — ABNORMAL HIGH (ref 11.5–15.5)
WBC: 4.4 10*3/uL (ref 4.0–10.5)

## 2018-11-03 LAB — COMPREHENSIVE METABOLIC PANEL
ALT: 14 U/L (ref 0–53)
AST: 19 U/L (ref 0–37)
Albumin: 4 g/dL (ref 3.5–5.2)
Alkaline Phosphatase: 62 U/L (ref 39–117)
BUN: 29 mg/dL — ABNORMAL HIGH (ref 6–23)
CO2: 25 mEq/L (ref 19–32)
Calcium: 9 mg/dL (ref 8.4–10.5)
Chloride: 106 mEq/L (ref 96–112)
Creatinine, Ser: 0.94 mg/dL (ref 0.40–1.50)
GFR: 75.5 mL/min (ref >60.00)
Glucose, Bld: 83 mg/dL (ref 70–99)
Potassium: 4.2 mEq/L (ref 3.5–5.1)
Sodium: 140 mEq/L (ref 135–145)
Total Bilirubin: 0.5 mg/dL (ref 0.2–1.2)
Total Protein: 6.5 g/dL (ref 6.0–8.3)

## 2018-11-03 LAB — VITAMIN B12: Vitamin B-12: 619 pg/mL (ref 211–911)

## 2018-11-03 LAB — TSH: TSH: 2.94 u[IU]/mL (ref 0.35–4.50)

## 2018-11-03 LAB — MAGNESIUM: Magnesium: 1.7 mg/dL (ref 1.5–2.5)

## 2018-11-03 MED ORDER — BACLOFEN 10 MG PO TABS: 10.0000 mg | ORAL_TABLET | Freq: Three times a day (TID) | ORAL | 0 refills | Status: DC | PRN

## 2018-11-03 NOTE — Patient Instructions (Addendum)
Nice to see you. We will get lab work today and contact you with the results. Once we get your lab work back I will determine if we can start you on an anti-inflammatory.  I have prescribed a muscle relaxer for you to take.  This may make you drowsy so please be wary of this and do not drive if it does make you drowsy. Please discontinue your amlodipine.  Please monitor your blood pressure daily for the next week.  We will have you return for a follow-up visit for a virtual visit for blood pressure follow-up.

## 2018-11-03 NOTE — Assessment & Plan Note (Signed)
Release signed in the office to request imaging records from the [place].

## 2018-11-03 NOTE — Assessment & Plan Note (Signed)
Patient with chronic degenerative changes on most recent x-ray.  He is neurologically intact in his lower extremities on today's exam.  We will treat with baclofen as needed.  He will monitor for drowsiness.  We will determine ability to treat with an anti-inflammatory based on kidney function.

## 2018-11-03 NOTE — Assessment & Plan Note (Addendum)
Patient is orthostatic today.  I suspect this is causing his lightheadedness and contributing somewhat to his decreased energy level when his blood pressure drops.  We will have him stop his amlodipine and monitor his blood pressure.  Follow-up in 1 week for BP follow-up.

## 2018-11-03 NOTE — Assessment & Plan Note (Signed)
Some depression though he reports this is improving with the duloxetine.  I do think the stressors at home are contributing to his decreased energy level.  He will continue duloxetine we will see how he does with change in his blood pressure medication.

## 2018-11-03 NOTE — Progress Notes (Signed)
[name], MD Phone: [phone]  Allen David is a 85 y.o. male who presents today for follow-up.  Hypertension: Patient notes oftentimes he will drop down into the 100s over 50s.  He feels poorly when this occurs and feels like he does not have much energy.  He notes other times when his blood pressures in the 130s over 70s he feels better.  No chest pain, shortness of breath, or edema.  Occasional lightheadedness if he gets up quickly.  Depression: He does note some depression though this seems to be getting better with his duloxetine.  He occasionally takes Xanax.  No alcohol use with this.  He has had some aggravation with his house recently and his daughter notes that his other daughter is causing some aggravation and talking about the patient's death and telling him that he looks jaundiced (the patient gave permission for me to talk with the patient's daughter who accompanied him without him in the room, she reported the prior statements).  No SI.  Chronic back pain: He has been having more back spasms and leg spasms recently.  Some left-sided sciatica.  No numbness.  Does feel as though both legs are weak at times.  No incontinence.  He was previously on a muscle relaxer and an anti-inflammatory with good benefit.  No recent injuries.  He notes these issues have been going on for over a year.  Social History   Tobacco Use  Smoking Status Former Smoker  . Quit date: 05/19/1966  . Years since quitting: 52.4  Smokeless Tobacco Former Systems developer  Tobacco Comment   Quit in [employer] see history of present illness  Objective  Physical Exam Vitals:   11/03/18 0921 11/03/18 0922  BP: 110/70 (!) 120/50  Pulse: 77 84  Temp:    SpO2:      Laying blood pressure 130/80 pulse 73 Sitting blood pressure 110/70 pulse 77 Standing blood pressure 120/50 pulse 84   BP Readings from Last 3 Encounters:  11/03/18 (!) 120/50  05/28/18 122/64  05/14/18 132/80   Wt Readings from  Last 3 Encounters:  11/03/18 183 lb 6.4 oz (83.2 kg)  05/28/18 189 lb 3.2 oz (85.8 kg)  05/14/18 185 lb 6.4 oz (84.1 kg)    Physical Exam Constitutional:      General: He is not in acute distress.    Appearance: He is not diaphoretic.  Cardiovascular:     Rate and Rhythm: Normal rate and regular rhythm.     Heart sounds: Normal heart sounds.  Pulmonary:     Effort: Pulmonary effort is normal.     Breath sounds: Normal breath sounds.  Musculoskeletal:     Comments: No midline spine tenderness, no midline spine step-off, no muscular back tenderness  Skin:    General: Skin is warm and dry.  Neurological:     Mental Status: He is alert.     Comments: 5/5 strength bilateral quads, hamstrings, plantar flexion, and dorsiflexion, sensation light touch intact bilateral lower extremities, 2+ patellar and Achilles reflexes      Assessment/Plan: Please see individual problem list.  Hypertension Patient is orthostatic today.  I suspect this is causing his lightheadedness and contributing somewhat to his decreased energy level when his blood pressure drops.  We will have him stop his amlodipine and monitor his blood pressure.  Follow-up in 1 week for BP follow-up.  Adrenal adenoma Release signed in the office to request imaging records from the [place].  Anxiety  and depression Some depression though he reports this is improving with the duloxetine.  I do think the stressors at home are contributing to his decreased energy level.  He will continue duloxetine we will see how he does with change in his blood pressure medication.  Chronic low back pain Patient with chronic degenerative changes on most recent x-ray.  He is neurologically intact in his lower extremities on today's exam.  We will treat with baclofen as needed.  He will monitor for drowsiness.  We will determine ability to treat with an anti-inflammatory based on kidney function.  Muscle cramps Potentially related to his back though  could also be related to dehydration or electrolyte issue.  Encouraged adequate hydration.  We will check lab work as well.   Orders Placed This Encounter  Procedures  . Magnesium  Future orders for lab work were collected today.  Meds ordered this encounter  Medications  . baclofen (LIORESAL) 10 MG tablet    Sig: Take 1 tablet (10 mg total) by mouth 3 (three) times daily as needed for muscle spasms.    Dispense:  30 each    Refill:  0     [name], MD [place]

## 2018-11-03 NOTE — Assessment & Plan Note (Signed)
Potentially related to his back though could also be related to dehydration or electrolyte issue.  Encouraged adequate hydration.  We will check lab work as well.

## 2018-11-04 ENCOUNTER — Telehealth: Payer: Self-pay

## 2018-11-04 LAB — T3: T3, Total: 109 ng/dL (ref 76–181)

## 2018-11-04 LAB — T4: T4, Total: 6.5 ug/dL (ref 4.9–10.5)

## 2018-11-04 NOTE — Telephone Encounter (Signed)
Copied from [place] [id number]. Topic: General - Other >> Nov 03, 2018  4:15 PM Percell Belt A wrote: Reason for CRM: Daughter called in and stated pt is getting he DULoxetine (CYMBALTA) 30 MG capsule [[id number]] from the [place] now and his is taking 60 mg daily.  She is also needing a hard script for the baclofen (LIORESAL) 10 MG tablet [[id number] so they can get that filled at the [facility] number [phone]/ [name]

## 2018-11-05 ENCOUNTER — Telehealth: Payer: Self-pay

## 2018-11-05 ENCOUNTER — Telehealth: Payer: Self-pay | Admitting: *Deleted

## 2018-11-05 DIAGNOSIS — D649 Anemia, unspecified: Secondary | ICD-10-CM

## 2018-11-05 NOTE — Telephone Encounter (Signed)
Please place future orders for lab appt.  

## 2018-11-07 NOTE — Telephone Encounter (Signed)
Orders placed.

## 2018-11-08 DIAGNOSIS — H903 Sensorineural hearing loss, bilateral: Secondary | ICD-10-CM | POA: Diagnosis not present

## 2018-11-08 MED ORDER — BACLOFEN 10 MG PO TABS: 10.0000 mg | ORAL_TABLET | Freq: Three times a day (TID) | ORAL | 0 refills | Status: DC | PRN

## 2018-11-08 NOTE — Telephone Encounter (Signed)
Baclofen prescription printed.  Placed in the sign folder.  Please let them know this is available for pickup.

## 2018-11-09 ENCOUNTER — Telehealth: Payer: Self-pay

## 2018-11-09 ENCOUNTER — Other Ambulatory Visit: Payer: Self-pay

## 2018-11-09 ENCOUNTER — Other Ambulatory Visit (INDEPENDENT_AMBULATORY_CARE_PROVIDER_SITE_OTHER): Payer: Medicare Other

## 2018-11-09 DIAGNOSIS — D649 Anemia, unspecified: Secondary | ICD-10-CM | POA: Diagnosis not present

## 2018-11-09 LAB — CBC
HCT: 35.7 % — ABNORMAL LOW (ref 39.0–52.0)
Hemoglobin: 11.7 g/dL — ABNORMAL LOW (ref 13.0–17.0)
MCHC: 32.8 g/dL (ref 30.0–36.0)
MCV: 90.9 fl (ref 78.0–100.0)
Platelets: 157 10*3/uL (ref 150.0–400.0)
RBC: 3.93 Mil/uL — ABNORMAL LOW (ref 4.22–5.81)
RDW: 17.1 % — ABNORMAL HIGH (ref 11.5–15.5)
WBC: 5.3 10*3/uL (ref 4.0–10.5)

## 2018-11-09 NOTE — Telephone Encounter (Signed)
Called and informed patient that the RX for baclofen was printed and put in the folder for pickup, pt understood.  Nina,cma

## 2018-11-09 NOTE — Telephone Encounter (Signed)
Copied from [place] [id number]. Topic: General - Other >> Nov 09, 2018 11:12 AM Keene Breath wrote: Reason for CRM: Patient's relative called to request that the office fax a ROI to the [facility] for the patient.  Fax # [phone].  Please call Ms. Wiles with any questions at [phone]

## 2018-11-11 ENCOUNTER — Other Ambulatory Visit: Payer: Self-pay

## 2018-11-11 ENCOUNTER — Ambulatory Visit: Payer: Medicare Other | Attending: Family Medicine

## 2018-11-11 DIAGNOSIS — M62830 Muscle spasm of back: Secondary | ICD-10-CM | POA: Insufficient documentation

## 2018-11-11 DIAGNOSIS — M5440 Lumbago with sciatica, unspecified side: Secondary | ICD-10-CM | POA: Insufficient documentation

## 2018-11-11 NOTE — Therapy (Signed)
[facility] [address], [place], [address] Phone: [phone]   Fax:  [phone]  Physical Therapy Evaluation  Patient Details  Name: Allen David MRN: [id number] Date of Birth: Sep 30, 1927 Referring Provider (PT): Guse FNP   Encounter Date: 11/11/2018  PT End of Session - 11/11/18 1534    Visit Number  1    Number of Visits  13    Date for PT Re-Evaluation  12/23/18    Authorization Type  1 /10 medicare    PT Start Time  1400    PT Stop Time  1500    PT Time Calculation (min)  60 min    Activity Tolerance  Patient tolerated treatment well    Behavior During Therapy  [facility] for tasks assessed/performed       Past Medical History:  Diagnosis Date  . Anxiety   . Back injury    with chronic pain  . CAD in native artery   . Chronic low back pain   . Depression   . ED (erectile dysfunction)   . Elevated PSA   . HLD (hyperlipidemia)   . HTN (hypertension)   . IBS (irritable bowel syndrome)   . Malaise and fatigue   . Pernicious anemia     Past Surgical History:  Procedure Laterality Date  . EYE SURGERY    . VASECTOMY      There were no vitals filed for this visit.   Subjective Assessment - 11/11/18 1524    Subjective  Patient is a 85 yo male who reports increased B LBP along the lower aspect of his lumbar spine. Patient demonstrates increased difficulty and pain after doign work out in the yard and working under his camper and his boat. Patient states he has a 15 year history of back pain however, his pain had improved after seeing a doctor who prescribed a muscle relaxer to alleviate his symptoms. Recent increase in pain started 1 year prior from insidious onset. Patient reports he lies down on his back to decrease his pain and also takes tylenol. Patient reports at times the increase in pain lasts for 2 days or more but generally subsides over time. Patient reports he will often work through the  pain in order to do tasks around the house including digging, lifting, and bending. Patient demonstrates spasms along his calves B and and seeing a MD about this issue currently    Pertinent History  Hx of HTN, Chronic LBP; spasms along calves and lumbar spine    Limitations  Lifting;House hold activities    Diagnostic tests  X-Ray: results not yet given    Patient Stated Goals  To decrease pain    Currently in Pain?  No/denies    Pain Score  0-No pain   worst: 8/10   Pain Location  Back    Pain Orientation  Right;Left;Lower    Pain Descriptors / Indicators  Aching;Sore    Pain Type  Chronic pain    Pain Radiating Towards  along the lower leg into the ankle B    Pain Onset  More than a month ago    Pain Frequency  Intermittent         [facility] PT Assessment - 11/11/18 1549      Assessment   Medical Diagnosis  chronic low back pain    Referring Provider (PT)  Guse FNP    Onset Date/Surgical Date  10/17/17    Hand  Dominance  Right    Next MD Visit  unknown    Prior Therapy  no      Balance Screen   Has the patient fallen in the past 6 months  No    Has the patient had a decrease in activity level because of a fear of falling?   No    Is the patient reluctant to leave their home because of a fear of falling?   No      Home Film/video editor residence    Living Arrangements  Alone    Type of [place]      Prior Function   Level of Independence  Independent    Vocation  Retired    Biomedical scientist  n/a    Leisure  Yard work, working on Research officer, trade union      Observation/Other Assessments   Observations  fidgetting often when talking and sitting      Media planner Intact      Posture/Postural Control   Posture Comments  FHP, forward rounded shoulders, forward lumbar trunk lean      ROM / Strength   AROM / PROM / Strength  AROM;Strength      AROM   AROM Assessment Site  Hip;Lumbar    Right/Left Hip  Right;Left    Right Hip  Flexion  100    Right Hip External Rotation   30    Right Hip Internal Rotation   30    Right Hip ABduction  30    Right Hip ADduction  20    Left Hip Flexion  100    Left Hip External Rotation   30    Left Hip Internal Rotation   30    Left Hip ABduction  30    Left Hip ADduction  20    Lumbar Flexion  33% limited pain at end range    Lumbar Extension  50% limited pain at end range    Lumbar - Right Side Bend  25% limited    Lumbar - Left Side Bend  25% limited    Lumbar - Right Rotation  33% limited pain at end range    Lumbar - Left Rotation  33% limited pain at end range      Strength   Strength Assessment Site  Hip;Lumbar    Right/Left Hip  Right;Left    Right Hip Flexion  4/5    Right Hip External Rotation   4-/5    Right Hip Internal Rotation  4/5    Right Hip ABduction  4/5    Right Hip ADduction  4/5    Left Hip Flexion  4/5    Left Hip External Rotation  4-/5    Left Hip Internal Rotation  4/5    Left Hip ABduction  4/5    Left Hip ADduction  4/5    Lumbar Flexion  5/5    Lumbar Extension  5/5      Palpation   SI assessment   hypomobility: L1-5 pain on the L facet with PA with L3-5    Palpation comment  TTP along multifidi      Special Tests    Special Tests  Lumbar    Lumbar Tests  Slump Test;FABER test;Straight Leg Raise      FABER test   findings  Negative    Comment  B      Slump test   Findings  Negative    Comment  B      Straight Leg Raise   Findings  Negative    Comment  B        Objective measurements completed on examination: See above findings.   TREATMENT Therapeutic Exercise: Lumbar/hip CKC lumbar extension -- x 20  Prone press ups -- x 10   Patient demonstrates no lasting increase in pain at the end of the session. Performed exercises to decrease lumbar spasms and improve overall pain.     PT Education - 11/11/18 1532    Education Details  HEP: lumbar extension in standing; POC    Person(s) Educated  Patient    Methods   Demonstration;Explanation    Comprehension  Verbalized understanding;Returned demonstration          PT Long Term Goals - 11/11/18 1543      PT LONG TERM GOAL #1   Title  Patient will be independent with HEP to continue benefits of therapy until after discharge    Baseline  Dependent with exercise form and technique    Time  6    Period  Weeks    Status  New    Target Date  12/23/18      PT LONG TERM GOAL #2   Title  Patient will be able to work in his yard without an increase in pain requiring to stop and lay down to better function with less pain    Baseline  Requires laying down to decrease pain when working in the lawn 1-2 times per week    Time  6    Period  Weeks    Status  New    Target Date  12/23/18      PT LONG TERM GOAL #3   Title  Patient will have a worst pain of 2/10 along his low back in order to do more activities along the house and improve ability to perform bending motions.    Baseline  8/10 worst pain    Time  6    Period  Weeks    Status  New    Target Date  12/23/18             Plan - 11/11/18 1534    Clinical Impression Statement  Patient is a 85 yo right hand dominant male presenting with increased pain along his lumbar spine from idiopathethic onset. Patient demonstrates lumbar dysfunction with symptoms aligning with lumbar radiculapathy with hypomobility most notably along the facets of the lower lumbar. Patient demonstrates increased hip weakness B and limited end range motion of the lumbar spine. Patient will benefit from further skilled therapy focused on decreasing pain and spasms to return to prior level of function.    Personal Factors and Comorbidities  Age;Comorbidity 2    Comorbidities  HTN, [place] LBP    Examination-Activity Limitations  Lift;Squat;Carry;Transfers;Bend    Examination-Participation Restrictions  Cleaning;Yard Work    Merchant navy officer  Evolving/Moderate complexity    Clinical Decision Making   Moderate    Rehab Potential  Good    PT Frequency  2x / week    PT Duration  6 weeks    PT Treatment/Interventions  Therapeutic exercise;Patient/family education;Therapeutic activities;Stair training;Gait training;Cryotherapy;Neuromuscular re-education;Manual techniques;Dry needling;Passive range of motion;Spinal Manipulations;Joint Manipulations    PT Next Visit Plan  Progress strengthening exercise    PT Home Exercise Plan  see education section    Consulted and Agree with Plan of Care  Patient  Patient will benefit from skilled therapeutic intervention in order to improve the following deficits and impairments:  Decreased endurance, Decreased mobility, Hypomobility, Increased muscle spasms, Decreased range of motion, Decreased activity tolerance, Decreased strength, Postural dysfunction, Pain  Visit Diagnosis: 1. Low back pain with sciatica, sciatica laterality unspecified, unspecified back pain laterality, unspecified chronicity   2. Muscle spasm of back        Problem List Patient Active Problem List   Diagnosis Date Noted  . Muscle cramps 11/03/2018  . Difficulty hearing 09/20/2018  . Leg pain 09/20/2018  . Proteinuria 05/30/2018  . Carpal tunnel syndrome 05/30/2018  . Fatty liver 05/14/2018  . Anxiety 05/14/2018  . Aortic atherosclerosis ([place]) 01/05/2018  . Neuropathy 12/26/2017  . Allergic rhinitis 09/16/2017  . Muscular chest pain 08/21/2017  . Adrenal adenoma 03/11/2017  . Incidental lung nodule, > 35mm and < 7mm 03/11/2017  . Right foot pain 11/04/2016  . Other specified abdominal hernia without obstruction or gangrene 11/04/2016  . Skin lesion 01/01/2015  . Arthralgia of both knees 09/04/2014  . Pernicious anemia 12/20/2013  . Hypertension 06/03/2012  . Chronic low back pain 06/03/2012  . Anxiety and depression 06/03/2012  . Hyperlipidemia 01/20/2012  . CAD, NATIVE VESSEL 01/09/2010    Blythe Stanford, PT DPT 11/11/2018, 3:58 PM  [facility] [address] [place], [address] Phone: [phone]   Fax:  [phone]  Name: Allen David MRN: [id number] Date of Birth: 12/15/1927

## 2018-11-15 ENCOUNTER — Other Ambulatory Visit: Payer: Self-pay

## 2018-11-15 ENCOUNTER — Ambulatory Visit (INDEPENDENT_AMBULATORY_CARE_PROVIDER_SITE_OTHER): Payer: Medicare Other | Admitting: Family Medicine

## 2018-11-15 DIAGNOSIS — F329 Major depressive disorder, single episode, unspecified: Secondary | ICD-10-CM | POA: Diagnosis not present

## 2018-11-15 DIAGNOSIS — F419 Anxiety disorder, unspecified: Secondary | ICD-10-CM | POA: Diagnosis not present

## 2018-11-15 DIAGNOSIS — G8929 Other chronic pain: Secondary | ICD-10-CM | POA: Diagnosis not present

## 2018-11-15 DIAGNOSIS — M5442 Lumbago with sciatica, left side: Secondary | ICD-10-CM

## 2018-11-15 DIAGNOSIS — D510 Vitamin B12 deficiency anemia due to intrinsic factor deficiency: Secondary | ICD-10-CM | POA: Diagnosis not present

## 2018-11-15 DIAGNOSIS — I10 Essential (primary) hypertension: Secondary | ICD-10-CM

## 2018-11-15 NOTE — Progress Notes (Signed)
Virtual Visit via video Note  This visit type was conducted due to national recommendations for restrictions regarding the COVID-19 pandemic (e.g. social distancing).  This format is felt to be most appropriate for this patient at this time.  All issues noted in this document were discussed and addressed.  No physical exam was performed (except for noted visual exam findings with Video Visits).   I connected with Allen David today at 11:30 AM EDT by a video enabled telemedicine application and verified that I am speaking with the correct person using two identifiers. Location patient: home Location provider: work  Persons participating in the virtual visit: patient, provider, [name] (daughter)  I discussed the limitations, risks, security and privacy concerns of performing an evaluation and management service by telephone and the availability of in person appointments. I also discussed with the patient that there may be a patient responsible charge related to this service. The patient expressed understanding and agreed to proceed.   Reason for visit: Follow-up.  HPI: Hypertension: Typically running 59s over 57s.  His blood pressure remains well controlled after discontinuing amlodipine.  His energy level has not changed.  No chest pain or shortness of breath.  He does still have some lightheadedness though he thinks it may be related to his eyes and he is going to see the doctor at the [facility] for that.  Depression: Patient does note some depression.  His patience is not as good as it was previously.  He notes his energy level is up and down and some days he is okay.  He is on Cymbalta and thinks that has been beneficial.  He would be willing to try another medication for this.  No SI.  Anemia: Found to be anemic on lab work.  He denies hematuria, blood in stool, and melena.  Low back pain: Patient did go to physical therapy though he canceled follow-up treatments given cost concerns.   ROS: See pertinent positives and negatives per HPI.  Past Medical History:  Diagnosis Date  . Anxiety   . Back injury    with chronic pain  . CAD in native artery   . Chronic low back pain   . Depression   . ED (erectile dysfunction)   . Elevated PSA   . HLD (hyperlipidemia)   . HTN (hypertension)   . IBS (irritable bowel syndrome)   . Malaise and fatigue   . Pernicious anemia     Past Surgical History:  Procedure Laterality Date  . EYE SURGERY    . VASECTOMY      Family History  Problem Relation Age of Onset  . Stroke Mother   . Hypertension Mother   . Ulcers Mother   . Cancer Father        multiple myeloma  . Diabetes Sister   . Diabetes Brother   . Diabetes Brother     SOCIAL HX: Former smoker.   Current Outpatient Medications:  .  Acetaminophen (TYLENOL ARTHRITIS PAIN PO), Take 1 tablet by mouth as needed. , Disp: , Rfl:  .  ALPRAZolam (XANAX) 1 MG tablet, Take 1 mg by mouth 4 (four) times daily as needed for sleep., Disp: , Rfl:  .  aspirin (ASPIRIN 81) 81 MG EC tablet, Take 1 tablet (81 mg total) by mouth daily., Disp: 30 tablet, Rfl: 6 .  atorvastatin (LIPITOR) 20 MG tablet, Take 1 tablet (20 mg total) by mouth daily., Disp: 90 tablet, Rfl: 3 .  baclofen (LIORESAL) 10 MG  tablet, Take 1 tablet (10 mg total) by mouth 3 (three) times daily as needed for muscle spasms., Disp: 30 each, Rfl: 0 .  Clobetasol Prop Emollient Base (CLOBETASOL PROPIONATE E) 0.05 % emollient cream, Apply 0.05 % topically 2 (two) times daily., Disp: , Rfl:  .  DULoxetine (CYMBALTA) 30 MG capsule, Take 30 mg (1 capsule) by mouth daily for 7 days, then take 60 mg (2 capsules) by mouth daily, Disp: 53 capsule, Rfl: 0 .  levothyroxine (SYNTHROID, LEVOTHROID) 25 MCG tablet, Take 1 tablet (25 mcg total) by mouth daily before breakfast., Disp: , Rfl:  .  Multiple Vitamin (ONE-A-DAY MENS PO), Take 1 tablet by mouth daily. , Disp: , Rfl:   Current Facility-Administered Medications:  .   betamethasone acetate-betamethasone sodium phosphate (CELESTONE) injection 3 mg, 3 mg, Intramuscular, Once, Evans, Dorathy Daft, DPM  EXAM:  VITALS per patient if applicable: None.  GENERAL: alert, oriented, appears well and in no acute distress  HEENT: atraumatic, conjunttiva clear, no obvious abnormalities on inspection of external nose and ears  NECK: normal movements of the head and neck  LUNGS: on inspection no signs of respiratory distress, breathing rate appears normal, no obvious gross SOB, gasping or wheezing  CV: no obvious cyanosis  MS: moves all visible extremities without noticeable abnormality  PSYCH/NEURO: pleasant and cooperative,  speech and thought processing grossly intact  ASSESSMENT AND PLAN:  Discussed the following assessment and plan:  Hypertension Adequately controlled off of medication.  He will continue to monitor his blood pressure and remain off of amlodipine.  He will rise slowly from seated positions and try to stay adequately hydrated given his orthostasis.  He will [facility] his physician at the [facility] as well.  Anxiety and depression Some depression.  Will discuss with our clinical pharmacist whether or not we can add BuSpar to his Cymbalta to augment.  Chronic low back pain I discussed that it would be okay for him to hold off on physical therapy given the significant cost for each visit.  Pernicious anemia Recently anemic.  Has a history of pernicious anemia and thus we will check B12 though we will also check iron levels as well.    I discussed the assessment and treatment plan with the patient. The patient was provided an opportunity to ask questions and all were answered. The patient agreed with the plan and demonstrated an understanding of the instructions.   The patient was advised to call back or seek an in-person evaluation if the symptoms worsen or if the condition fails to improve as anticipated.   [name], MD

## 2018-11-16 ENCOUNTER — Encounter: Payer: Self-pay | Admitting: Family Medicine

## 2018-11-16 ENCOUNTER — Ambulatory Visit (INDEPENDENT_AMBULATORY_CARE_PROVIDER_SITE_OTHER): Payer: Medicare Other

## 2018-11-16 ENCOUNTER — Ambulatory Visit: Payer: Medicare Other

## 2018-11-16 DIAGNOSIS — M79605 Pain in left leg: Secondary | ICD-10-CM | POA: Diagnosis not present

## 2018-11-16 DIAGNOSIS — M79604 Pain in right leg: Secondary | ICD-10-CM | POA: Diagnosis not present

## 2018-11-16 DIAGNOSIS — G8929 Other chronic pain: Secondary | ICD-10-CM | POA: Diagnosis not present

## 2018-11-16 DIAGNOSIS — M5442 Lumbago with sciatica, left side: Secondary | ICD-10-CM | POA: Diagnosis not present

## 2018-11-16 NOTE — Assessment & Plan Note (Signed)
I discussed that it would be okay for him to hold off on physical therapy given the significant cost for each visit.

## 2018-11-16 NOTE — Assessment & Plan Note (Signed)
Adequately controlled off of medication.  He will continue to monitor his blood pressure and remain off of amlodipine.  He will rise slowly from seated positions and try to stay adequately hydrated given his orthostasis.  He will see his physician at the [facility] as well.

## 2018-11-16 NOTE — Assessment & Plan Note (Signed)
Some depression.  Will discuss with our clinical pharmacist whether or not we can add BuSpar to his Cymbalta to augment.

## 2018-11-16 NOTE — Assessment & Plan Note (Signed)
Recently anemic.  Has a history of pernicious anemia and thus we will check B12 though we will also check iron levels as well.

## 2018-11-18 ENCOUNTER — Ambulatory Visit: Payer: Medicare Other

## 2018-11-19 ENCOUNTER — Telehealth: Payer: Self-pay | Admitting: Family Medicine

## 2018-11-19 NOTE — Telephone Encounter (Signed)
Please let the patient know I heard back from our clinical pharmacist.  Please see if he is having more issues with depression or more issues with anxiety.  This will help determine which medication we add to his regimen.  Thanks.

## 2018-11-19 NOTE — Telephone Encounter (Signed)
-----   Message from De Hollingshead, [facility] sent at 11/16/2018  4:56 PM EDT ----- Dennison Nancy! It's fine!  If it's more depression, I'd augment with Wellbutrin. If more anxiety, then Buspar.   Catie ----- Message ----- From: Leone Haven, MD Sent: 11/16/2018   4:03 PM EDT To: De Hollingshead, Ray Catie,   I think this will be ok but wanted to make sure. This patient is on cymbalta for his depression and I would like to augment with buspar or potentially wellbutrin. Just wanted to make sure the cymbalta plays well with the other 2 medications.   Randall Hiss

## 2018-11-22 NOTE — Telephone Encounter (Signed)
Called and spoke with the patient and he states he is not having anymore issues right now with depression.  Gwen Edler,cma

## 2018-11-22 NOTE — Telephone Encounter (Signed)
Noted. We will continue to monitor on his current regimen.

## 2018-11-23 NOTE — Telephone Encounter (Signed)
Patient will return to sign ROI form.  Nina,cma

## 2018-12-08 ENCOUNTER — Telehealth: Payer: Self-pay | Admitting: Family Medicine

## 2018-12-08 DIAGNOSIS — M5416 Radiculopathy, lumbar region: Secondary | ICD-10-CM | POA: Diagnosis not present

## 2018-12-08 DIAGNOSIS — M5030 Other cervical disc degeneration, unspecified cervical region: Secondary | ICD-10-CM | POA: Diagnosis not present

## 2018-12-08 DIAGNOSIS — M5136 Other intervertebral disc degeneration, lumbar region: Secondary | ICD-10-CM | POA: Diagnosis not present

## 2018-12-08 NOTE — Telephone Encounter (Signed)
Called and spoke to Allen David, pt's daughter. (ok per DPR).  Mrs. Allen David was w/ pt at the time of call.  Pt's daughter said that pt's bp reading at home last week was 160/80 and today it was 159/83.  Pt's daughter said that Dr. Caryl Bis took pt off of bp for a trial period and told pt to resume medicine and contact him if pt's bp reading was over 150.  Pt's daughter said pt wanted to confirm if Dr. Caryl Bis wants pt to restart bp meds.  Pt's daughter said that pt is not having any cardiac symptoms but did have a bleed in the back of his right eye that he went to have checked at the [facility] hospital last week. Pt's daughter is not sure of the dx but said that she will call back with the medical term and said that [facility].  Pt's daughter believes that the eye bleed may be coming from from high bp.    Pt's daughter said that pt signed a ROI form at the [facility] so that Dr. Caryl Bis can get records.

## 2018-12-08 NOTE — Telephone Encounter (Signed)
Pt's daughter [name] called back in to provide the fathers Dx, requested to speak back with CMA per as advised.    Dx is: BRBO  Branch Retinal Vein Occlusion

## 2018-12-08 NOTE — Telephone Encounter (Signed)
Noted.  He should resume his amlodipine 5 mg daily.  He will need follow-up for this in 1 week.  This could be a virtual visit or an office visit as long as he can pass the COVID screening questions.

## 2018-12-08 NOTE — Telephone Encounter (Signed)
Patient's daughter would like for the provider to call the patient and tell him it's ok to go back on his blood pressure medication because his blood pressure went up to 160/80.  This is for reinforcement.

## 2018-12-08 NOTE — Telephone Encounter (Signed)
Patient aware to resume amlodipine 5 mg daily.  In-person office visit scheduled for 12/13/18 @ 10 am.  Pt passed COVID screening.    Pt advised to go to ED if he begins having any cardiac symptoms associated w/ elevated bp.  Signs and symptoms reviewed w/ pt.

## 2018-12-09 NOTE — Telephone Encounter (Signed)
Noted  

## 2018-12-13 ENCOUNTER — Ambulatory Visit (INDEPENDENT_AMBULATORY_CARE_PROVIDER_SITE_OTHER): Payer: Medicare Other | Admitting: Family Medicine

## 2018-12-13 ENCOUNTER — Telehealth: Payer: Self-pay | Admitting: *Deleted

## 2018-12-13 ENCOUNTER — Other Ambulatory Visit: Payer: Self-pay

## 2018-12-13 ENCOUNTER — Encounter: Payer: Self-pay | Admitting: Family Medicine

## 2018-12-13 DIAGNOSIS — G8929 Other chronic pain: Secondary | ICD-10-CM | POA: Diagnosis not present

## 2018-12-13 DIAGNOSIS — F329 Major depressive disorder, single episode, unspecified: Secondary | ICD-10-CM | POA: Diagnosis not present

## 2018-12-13 DIAGNOSIS — D510 Vitamin B12 deficiency anemia due to intrinsic factor deficiency: Secondary | ICD-10-CM | POA: Diagnosis not present

## 2018-12-13 DIAGNOSIS — H348392 Tributary (branch) retinal vein occlusion, unspecified eye, stable: Secondary | ICD-10-CM | POA: Insufficient documentation

## 2018-12-13 DIAGNOSIS — M5442 Lumbago with sciatica, left side: Secondary | ICD-10-CM | POA: Diagnosis not present

## 2018-12-13 DIAGNOSIS — I10 Essential (primary) hypertension: Secondary | ICD-10-CM | POA: Diagnosis not present

## 2018-12-13 DIAGNOSIS — F419 Anxiety disorder, unspecified: Secondary | ICD-10-CM | POA: Diagnosis not present

## 2018-12-13 LAB — CBC
HCT: 33.3 % — ABNORMAL LOW (ref 39.0–52.0)
Hemoglobin: 11 g/dL — ABNORMAL LOW (ref 13.0–17.0)
MCHC: 33 g/dL (ref 30.0–36.0)
MCV: 90 fl (ref 78.0–100.0)
Platelets: 159 10*3/uL (ref 150.0–400.0)
RBC: 3.7 Mil/uL — ABNORMAL LOW (ref 4.22–5.81)
RDW: 16.7 % — ABNORMAL HIGH (ref 11.5–15.5)
WBC: 4.3 10*3/uL (ref 4.0–10.5)

## 2018-12-13 LAB — IBC + FERRITIN
Ferritin: 396.4 ng/mL — ABNORMAL HIGH (ref 22.0–322.0)
Iron: 50 ug/dL (ref 42–165)
Saturation Ratios: 18.3 % — ABNORMAL LOW (ref 20.0–50.0)
Transferrin: 195 mg/dL — ABNORMAL LOW (ref 212.0–360.0)

## 2018-12-13 LAB — VITAMIN B12: Vitamin B-12: 469 pg/mL (ref 211–911)

## 2018-12-13 NOTE — Assessment & Plan Note (Signed)
Diagnosis reported by the patient's daughter.  He will continue to see ophthalmology.

## 2018-12-13 NOTE — Telephone Encounter (Signed)
Copied from [place] [id number]. Topic: General - Other >> Dec 13, 2018  1:12 PM Leward Quan A wrote: Reason for CRM: Patient daughter [name] called to say that Dr Theda Sers at the [facility] is requesting progress notes from Dr Caryl Bis along with the Rx for baclofen (LIORESAL) 10 MG tablet. Fax# [phone] [name] is asking for a call back when this is done. Please call Ph# [phone]

## 2018-12-13 NOTE — Patient Instructions (Signed)
Nice to see you. Your BP is good today. Please continue to monitor at home and if it trends up over 140/90 please let us know.  If it starts to go below 100/60 please let us know and also please let us know if you develop lightheadedness.  Please contact your cardiologist for follow-up as discussed.  Please try to stay active and complete physical therapy for your back.  Please monitor your anxiety and depression and if it worsens please let us know. Please keep your follow-up appointment with the ophthalmologist.

## 2018-12-13 NOTE — Assessment & Plan Note (Signed)
He will continue to see a specialist regarding this.

## 2018-12-13 NOTE — Progress Notes (Signed)
  Allen Rumps, MD Phone: [phone]  Allen David is a 85 y.o. male who presents today for follow-up.  Hypertension: Has been ranging 106-112/60-70 since going back on amlodipine.  Occasionally into the 924Q systolically.  No chest pain, shortness of breath, or edema.  Retinal vein occlusion: Patient saw ophthalmology for this.  He had seen some increase in floaters.  He had no vision changes.  He has follow-up with them coming soon.  They have him on medicated eyedrops.  Anxiety/depression: Patient notes he has quite a bit of stuff going on at home and that is causing some anxiety.  He notes some days he does feel down.  No SI.  He feels better when he is active and when he is able to talk to others.  He is on Cymbalta.  Chronic back pain: Some days are okay and other days his back grabs him.  He has seen neurosurgery and they referred him to physical medicine rehab who want him to do physical therapy and possibly injections.  Social History   Tobacco Use  Smoking Status Former Smoker  . Quit date: 05/19/1966  . Years since quitting: 52.6  Smokeless Tobacco Former Systems developer  Tobacco Comment   Quit in [place] see history of present illness  Objective  Physical Exam Vitals:   12/13/18 1018  BP: 120/70  Pulse: 71  Temp: 98.2 F (36.8 C)  SpO2: 98%    BP Readings from Last 3 Encounters:  12/13/18 120/70  11/03/18 (!) 120/50  05/28/18 122/64   Wt Readings from Last 3 Encounters:  12/13/18 178 lb 3.2 oz (80.8 kg)  11/03/18 183 lb 6.4 oz (83.2 kg)  05/28/18 189 lb 3.2 oz (85.8 kg)    Physical Exam Constitutional:      General: He is not in acute distress.    Appearance: He is not diaphoretic.  Cardiovascular:     Rate and Rhythm: Normal rate and regular rhythm.     Heart sounds: Normal heart sounds.  Pulmonary:     Effort: Pulmonary effort is normal.     Breath sounds: Normal breath sounds.  Musculoskeletal:     Comments: No midline spine tenderness, no  midline spine step-off, no muscular back tenderness  Skin:    General: Skin is warm and dry.  Neurological:     Mental Status: He is alert.      Assessment/Plan: Please see individual problem list.  Hypertension Well-controlled.  Continue amlodipine.  Given parameters to call with regarding his blood pressure.  Anxiety and depression Relatively stable.  Discussed staying active and trying to talk to others as that does seem to be beneficial.  Discussed trying to minimize physical contact with anybody given the COVID-19 pandemic and to practice good social distancing precautions.  Chronic low back pain He will continue to see a specialist regarding this.  Branch retinal vein occlusion Diagnosis reported by the patient's daughter.  He will continue to see ophthalmology.    No orders of the defined types were placed in this encounter.   No orders of the defined types were placed in this encounter.    Allen Rumps, MD [place]

## 2018-12-13 NOTE — Telephone Encounter (Signed)
Patient's daughter called and she needs progress notes and the Rx for baclofen 10 mg tablets faxed to Dr. Theda Sers @ the [facility] clinic, the fax number is [phone].  I will call her back when done.  Nina,cma

## 2018-12-13 NOTE — Assessment & Plan Note (Signed)
Relatively stable.  Discussed staying active and trying to talk to others as that does seem to be beneficial.  Discussed trying to minimize physical contact with anybody given the COVID-19 pandemic and to practice good social distancing precautions.

## 2018-12-13 NOTE — Assessment & Plan Note (Signed)
Well-controlled.  Continue amlodipine.  Given parameters to call with regarding his blood pressure.

## 2018-12-15 NOTE — Telephone Encounter (Signed)
Have they faxed a release of information to [place]? We can fax the prescription though I believe we have to have a ROI if we don't already have one on file. Thanks.

## 2018-12-16 ENCOUNTER — Other Ambulatory Visit: Payer: Self-pay

## 2018-12-16 ENCOUNTER — Ambulatory Visit (INDEPENDENT_AMBULATORY_CARE_PROVIDER_SITE_OTHER): Payer: Medicare Other

## 2018-12-16 DIAGNOSIS — Z0000 Encounter for general adult medical examination without abnormal findings: Secondary | ICD-10-CM | POA: Diagnosis not present

## 2018-12-16 MED ORDER — BACLOFEN 10 MG PO TABS: 10.0000 mg | ORAL_TABLET | Freq: Three times a day (TID) | ORAL | 0 refills | Status: DC | PRN

## 2018-12-16 NOTE — Progress Notes (Signed)
Subjective:   KAMSIYOCHUKWU SPICKLER is a 85 y.o. male who presents for Medicare Annual/Subsequent preventive examination.  Review of Systems:  No ROS.  Medicare Wellness Virtual Visit.  Visual/audio telehealth visit, UTA vital signs.   See social history for additional risk factors.   Cardiac Risk Factors include: advanced age (>60mn, >>29women);male gender;hypertension     Objective:    Vitals: There were no vitals taken for this visit.  There is no height or weight on file to calculate BMI.  Advanced Directives 12/16/2018 11/17/2016  Does Patient Have a Medical Advance Directive? No No  Would patient like information on creating a medical advance directive? No - Patient declined Yes (MAU/Ambulatory/Procedural Areas - Information given)  Some encounter information is confidential and restricted. Go to Review Flowsheets activity to see all data.    Tobacco Social History   Tobacco Use  Smoking Status Former Smoker  . Quit date: 05/19/1966  . Years since quitting: 52.6  Smokeless Tobacco Former USystems developer Tobacco Comment   Quit in 1DeLisleg[place]: Not Answered Comment: Quit in 1968   Clinical Intake:  Pre-visit preparation completed: Yes        Diabetes: No  How often do you need to have someone help you when you read instructions, pamphlets, or other written materials from your doctor or pharmacy?: 1 - Never  Interpreter Needed?: No     Past Medical History:  Diagnosis Date  . Anxiety   . Back injury    with chronic pain  . CAD in native artery   . Chronic low back pain   . Depression   . ED (erectile dysfunction)   . Elevated PSA   . HLD (hyperlipidemia)   . HTN (hypertension)   . IBS (irritable bowel syndrome)   . Malaise and fatigue   . Pernicious anemia    Past Surgical History:  Procedure Laterality Date  . EYE SURGERY    . VASECTOMY     Family History  Problem Relation Age of Onset  . Stroke Mother   . Hypertension Mother   . Ulcers  Mother   . Cancer Father        multiple myeloma  . Diabetes Sister   . Diabetes Brother   . Diabetes Brother    Social History   Socioeconomic History  . Marital status: Single    Spouse name: Not on file  . Number of children: Not on file  . Years of education: Not on file  . Highest education level: Not on file  Occupational History  . Occupation: Retired  SScientific laboratory technician . Financial resource strain: Not hard at all  . Food insecurity    Worry: Never true    Inability: Never true  . Transportation needs    Medical: No    Non-medical: No  Tobacco Use  . Smoking status: Former Smoker    Quit date: 05/19/1966    Years since quitting: 52.6  . Smokeless tobacco: Former USystems developer . Tobacco comment: Quit in 1968  Substance and Sexual Activity  . Alcohol use: Yes    Alcohol/week: 0.0 - 1.0 standard drinks    Comment: daily  . Drug use: No  . Sexual activity: Not Currently  Lifestyle  . Physical activity    Days per week: Not on file    Minutes per session: Not on file  . Stress: Not on file  Relationships  . Social connections  Talks on phone: Not on file    Gets together: Not on file    Attends religious service: Not on file    Active member of club or organization: Not on file    Attends meetings of clubs or organizations: Not on file    Relationship status: Not on file  Other Topics Concern  . Not on file  Social History Narrative   Lives in [place] alone. Children, 4 girls.      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      Regularly exercise- rides bike and mows lawn. Divorced.    Outpatient Encounter Medications as of 12/16/2018  Medication Sig  . Acetaminophen (TYLENOL ARTHRITIS PAIN PO) Take 1 tablet by mouth as needed.   . ALPRAZolam (XANAX) 1 MG tablet Take 1 mg by mouth 4 (four) times daily as needed for sleep.  Marland Kitchen aspirin (ASPIRIN 81) 81 MG EC tablet Take 1 tablet (81 mg total) by mouth daily.  Marland Kitchen  atorvastatin (LIPITOR) 20 MG tablet Take 1 tablet (20 mg total) by mouth daily.  . baclofen (LIORESAL) 10 MG tablet Take 1 tablet (10 mg total) by mouth 3 (three) times daily as needed for muscle spasms.  . Clobetasol Prop Emollient Base (CLOBETASOL PROPIONATE E) 0.05 % emollient cream Apply 0.05 % topically 2 (two) times daily.  . DULoxetine (CYMBALTA) 30 MG capsule Take 30 mg (1 capsule) by mouth daily for 7 days, then take 60 mg (2 capsules) by mouth daily  . levothyroxine (SYNTHROID, LEVOTHROID) 25 MCG tablet Take 1 tablet (25 mcg total) by mouth daily before breakfast.  . Multiple Vitamin (ONE-A-DAY MENS PO) Take 1 tablet by mouth daily.    Facility-Administered Encounter Medications as of 12/16/2018  Medication  . betamethasone acetate-betamethasone sodium phosphate (CELESTONE) injection 3 mg    Activities of Daily Living In your present state of health, do you have any difficulty performing the following activities: 12/16/2018  Hearing? N  Vision? N  Difficulty concentrating or making decisions? N  Walking or climbing stairs? N  Dressing or bathing? N  Doing errands, shopping? N  Preparing Food and eating ? Y  Comment Daughter assists. Self feeds.  Using the Toilet? N  In the past six months, have you accidently leaked urine? N  Do you have problems with loss of bowel control? N  Managing your Medications? N  Managing your Finances? N  Housekeeping or managing your Housekeeping? Y  Comment Daughter assists  Some recent data might be hidden    Patient Care Team: Leone Haven, MD as PCP - General (Family Medicine) Minna Merritts, MD as Consulting Physician (Cardiology)   Assessment:   This is a routine wellness examination for [place].  I connected with patient 12/16/18 at 12:00 PM EDT by an audio enabled telemedicine application and verified that I am speaking with the correct person using two identifiers. Patient stated full name and DOB. Patient gave permission to  continue with virtual visit. Patient's location was at home and Nurse's location was at [place] office.   Health Screenings  Colonoscopy - 05/2004 Glaucoma -none Hearing -demonstrates normal hearing during visit. Labs followed by pcp  Dental- he plans to schedule Vision- visits within the last 12 months.  Social  Alcohol intake - yes      Smoking history-  former    Smokers in home? none Illicit drug use? none Physical activity- no routine. Active around  the home.  Diet - regular Sexually Active -not currently BMI- discussed the importance of a healthy diet, water intake and the benefits of aerobic exercise.  Educational material provided.   Safety  Patient feels safe at home- yes Patient does have smoke detectors at home- yes Patient does wear sunscreen or protective clothing when in direct sunlight -yes Patient does wear seat belt when in a moving vehicle -yes Patient drives- yes  DVVOH-60 precautions and sickness symptoms discussed.   Activities of Daily Living Patient denies needing assistance with: driving, household chores, feeding themselves, getting from bed to chair, getting to the toilet, bathing/showering, dressing, managing money, or preparing meals.  Daughter assists as needed.   Depression Screen Patient denies losing interest in daily life, feeling hopeless, or crying easily over simple problems.   Medication-taking as directed and without issues.   Fall Screen Patient denies being afraid of falling or falling in the last year.   Memory Screen Patient is alert.  Correctly identified the president of the [place] and season.  Immunizations The following Immunizations were discussed: Influenza, shingles, pneumonia, and tetanus.   Other Providers Patient Care Team: Leone Haven, MD as PCP - General (Family Medicine) Minna Merritts, MD as Consulting Physician (Cardiology)  Exercise Activities and Dietary recommendations Current Exercise Habits: Home  exercise routine, Type of exercise: stretching  Goals    . Increase water intake     Stay hydrated and drink plenty of fluids/water.       Fall Risk Fall Risk  12/16/2018 10/29/2018 04/05/2018 11/17/2016 03/19/2016  Falls in the past year? 0 0 0 No No  Comment - - Emmi Telephone Survey: data to providers prior to load - -  Number falls in past yr: - 0 - - -  Injury with Fall? - 0 - - -  Follow up - Falls evaluation completed - - -  Is the patient's home free of loose throw rugs in walkways, pet beds, electrical cords, etc? yes      Grab bars in the bathroom? yes      Handrails on the stairs? yes      Adequate lighting? yes  Depression Screen PHQ 2/9 Scores 12/16/2018 10/29/2018 11/17/2016 03/19/2016  PHQ - 2 Score 0 0 2 0  PHQ- 9 Score - 0 7 -    Cognitive Function MMSE - Mini Mental State Exam 11/17/2016  Orientation to time 5  Orientation to Place 5  Registration 3  Attention/ Calculation 5  Recall 3  Language- name 2 objects 2  Language- repeat 1  Language- follow 3 step command 3  Language- read & follow direction 1  Write a sentence 1  Copy design 1  Total score 30     6CIT Screen 12/16/2018  What Year? 0 points  What month? 0 points  What time? 0 points  Count back from 20 (No Data)  Months in reverse (No Data)    Immunization History  Administered Date(s) Administered  . Influenza Split 02/16/2014  . Influenza, High Dose Seasonal PF 05/14/2018  . Influenza-Unspecified 03/03/2012, 03/29/2013  . Pneumococcal Conjugate-13 02/16/2014  . Pneumococcal Polysaccharide-23 06/03/2008  . Zoster 06/03/2010   Screening Tests Health Maintenance  Topic Date Due  . INFLUENZA VACCINE  12/18/2018  . PNA vac Low Risk Adult  Completed  . TETANUS/TDAP  Discontinued      Plan:   End of life planning; Advanced aging; Advanced directives discussed.  No HCPOA/Living Will.  Additional information declined at this  time.  I have personally reviewed and noted the following in the  patient's chart:   . Medical and social history . Use of alcohol, tobacco or illicit drugs  . Current medications and supplements . Functional ability and status . Nutritional status . Physical activity . Advanced directives . List of other physicians . Hospitalizations, surgeries, and ER visits in previous 12 months . Vitals . Screenings to include cognitive, depression, and falls . Referrals and appointments  In addition, I have reviewed and discussed with patient certain preventive protocols, quality metrics, and best practice recommendations. A written personalized care plan for preventive services as well as general preventive health recommendations were provided to patient.     Varney Biles, LPN  [id number]

## 2018-12-16 NOTE — Telephone Encounter (Signed)
Baclofen printed. Please place in my folder to sign. Thanks.

## 2018-12-16 NOTE — Telephone Encounter (Signed)
Yes we do he signed one the last visit when he was in with his daughter.  Kennette Cuthrell,cma

## 2018-12-16 NOTE — Patient Instructions (Addendum)
  [name] , Thank you for taking time to come for your Medicare Wellness Visit. I appreciate your ongoing commitment to your health goals. Please review the following plan we discussed and let me know if I can assist you in the future.   These are the goals we discussed: Goals    . Increase water intake     Stay hydrated and drink plenty of fluids/water.       This is a list of the screening recommended for you and due dates:  Health Maintenance  Topic Date Due  . Flu Shot  12/18/2018  . Pneumonia vaccines  Completed  . Tetanus Vaccine  Discontinued

## 2018-12-17 NOTE — Telephone Encounter (Signed)
Put in tray to be signed.  Nina,cma

## 2018-12-22 ENCOUNTER — Telehealth: Payer: Self-pay | Admitting: Family Medicine

## 2018-12-22 NOTE — Telephone Encounter (Signed)
Pt request refill  baclofen (LIORESAL) 10 MG tablet  [facility] and he is requesting dr fill a 30 day until they can. Daughter wanted a call back to let her know when it is done.   [facility] [address] [place] [phone] (Phone) [phone] (Fax)

## 2018-12-23 MED ORDER — BACLOFEN 10 MG PO TABS: 10.0000 mg | ORAL_TABLET | Freq: Three times a day (TID) | ORAL | 0 refills | Status: DC | PRN

## 2018-12-23 NOTE — Telephone Encounter (Signed)
The daughter and left a message that the RX was sent to pharmacy

## 2018-12-23 NOTE — Telephone Encounter (Signed)
  Pt request refill  baclofen (LIORESAL) 10 MG tablet  [facility] not [facility] yet and he is requesting dr fill a 30 day until they can. Daughter wanted a call back to let her know when it is done.   [facility] [address] [place] [phone] (Phone) [phone] (Fax)         Documentation

## 2018-12-23 NOTE — Telephone Encounter (Signed)
Sent to pharmacy 

## 2018-12-28 ENCOUNTER — Encounter: Payer: Self-pay | Admitting: Cardiovascular Disease

## 2018-12-28 ENCOUNTER — Other Ambulatory Visit: Payer: Self-pay

## 2018-12-28 ENCOUNTER — Ambulatory Visit (INDEPENDENT_AMBULATORY_CARE_PROVIDER_SITE_OTHER): Payer: Medicare Other | Admitting: Cardiovascular Disease

## 2018-12-28 VITALS — BP 113/64 | HR 77 | Ht 72.0 in | Wt 177.8 lb

## 2018-12-28 DIAGNOSIS — I25119 Atherosclerotic heart disease of native coronary artery with unspecified angina pectoris: Secondary | ICD-10-CM | POA: Diagnosis not present
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
## 2018-12-28 NOTE — Patient Instructions (Signed)

## 2018-12-29 ENCOUNTER — Telehealth: Payer: Self-pay

## 2018-12-29 NOTE — Telephone Encounter (Signed)
Copied from [place] [id number]. Topic: General - Other >> Dec 13, 2018  1:12 PM Leward Quan A wrote: Reason for CRM: Patient daughter [name] called to say that Dr Theda Sers at the [facility] is requesting progress notes from Dr Caryl Bis along with the Rx for baclofen (LIORESAL) 10 MG tablet. Fax# [phone] [name] is asking for a call back when this is done. Please call Ph# [phone] >> Dec 29, 2018  8:27 AM Leward Quan A wrote: Patient daughter [name] called stating that the progress notes requested since 12/13/2018 have not been received. Now they are requesting the progress notes and the Rx for the Baclofen. Please fax to Fax# [phone] and [phone]. Then contact [name] to inform her.

## 2018-12-31 ENCOUNTER — Other Ambulatory Visit: Payer: Self-pay

## 2018-12-31 MED ORDER — BACLOFEN 10 MG PO TABS: 10.0000 mg | ORAL_TABLET | Freq: Three times a day (TID) | ORAL | 0 refills | Status: DC | PRN

## 2018-12-31 NOTE — Telephone Encounter (Signed)
Called and left a message on the voicemail informing the daughter of patient that the progress note and the RX for baclofen was faxed to [facility] [place].  Nela Bascom,cma

## 2018-12-31 NOTE — Telephone Encounter (Signed)
The patient's medication for Baclofen was sent to [facility] in [place] wants it printed and faxed to them with the patient's recent progress notes, please print the Rx and sign and I will fax it with the progress notes.  Nina,.cma

## 2018-12-31 NOTE — Telephone Encounter (Signed)
Printed.  Please fax.

## 2018-12-31 NOTE — Addendum Note (Signed)
Addended by: Leone Haven on: 12/31/2018 02:51 PM   Modules accepted: Orders

## 2019-01-05 ENCOUNTER — Telehealth: Payer: Self-pay | Admitting: Family Medicine

## 2019-01-05 NOTE — Telephone Encounter (Signed)
patient states PCP encourage him to follow up with cardiologist Rockey Situ Kathlene November, MD, patient would like PCP to be aware.

## 2019-01-05 NOTE — Telephone Encounter (Signed)
Patient saw cardiology about a week ago.

## 2019-01-19 ENCOUNTER — Other Ambulatory Visit: Payer: Self-pay | Admitting: Physical Medicine and Rehabilitation

## 2019-01-19 DIAGNOSIS — G8929 Other chronic pain: Secondary | ICD-10-CM | POA: Diagnosis not present

## 2019-01-19 DIAGNOSIS — M5416 Radiculopathy, lumbar region: Secondary | ICD-10-CM | POA: Diagnosis not present

## 2019-01-19 DIAGNOSIS — M5136 Other intervertebral disc degeneration, lumbar region: Secondary | ICD-10-CM | POA: Diagnosis not present

## 2019-01-19 DIAGNOSIS — M5441 Lumbago with sciatica, right side: Secondary | ICD-10-CM | POA: Diagnosis not present

## 2019-01-19 DIAGNOSIS — M5030 Other cervical disc degeneration, unspecified cervical region: Secondary | ICD-10-CM | POA: Diagnosis not present

## 2019-01-19 DIAGNOSIS — M5442 Lumbago with sciatica, left side: Secondary | ICD-10-CM | POA: Diagnosis not present

## 2019-01-30 ENCOUNTER — Ambulatory Visit
Admission: RE | Admit: 2019-01-30 | Discharge: 2019-01-30 | Disposition: A | Discharge status: Home / Self Care | Payer: Medicare Other | Source: Ambulatory Visit | Attending: Physical Medicine and Rehabilitation | Admitting: Physical Medicine and Rehabilitation

## 2019-01-30 DIAGNOSIS — M545 Low back pain: Secondary | ICD-10-CM | POA: Diagnosis not present

## 2019-01-30 DIAGNOSIS — M5416 Radiculopathy, lumbar region: Secondary | ICD-10-CM | POA: Insufficient documentation

## 2019-02-23 DIAGNOSIS — M5136 Other intervertebral disc degeneration, lumbar region: Secondary | ICD-10-CM | POA: Diagnosis not present

## 2019-02-23 DIAGNOSIS — M5416 Radiculopathy, lumbar region: Secondary | ICD-10-CM | POA: Diagnosis not present

## 2019-02-23 DIAGNOSIS — M48062 Spinal stenosis, lumbar region with neurogenic claudication: Secondary | ICD-10-CM | POA: Diagnosis not present

## 2019-03-23 ENCOUNTER — Other Ambulatory Visit: Payer: Self-pay

## 2019-03-25 ENCOUNTER — Ambulatory Visit: Payer: No Typology Code available for payment source | Admitting: Family Medicine

## 2019-03-25 ENCOUNTER — Ambulatory Visit (INDEPENDENT_AMBULATORY_CARE_PROVIDER_SITE_OTHER): Payer: Medicare Other | Admitting: Family Medicine

## 2019-03-25 ENCOUNTER — Other Ambulatory Visit: Payer: Self-pay

## 2019-03-25 ENCOUNTER — Encounter: Payer: Self-pay | Admitting: Family Medicine

## 2019-03-25 DIAGNOSIS — F419 Anxiety disorder, unspecified: Secondary | ICD-10-CM | POA: Diagnosis not present

## 2019-03-25 DIAGNOSIS — I25119 Atherosclerotic heart disease of native coronary artery with unspecified angina pectoris: Secondary | ICD-10-CM | POA: Diagnosis not present

## 2019-03-25 DIAGNOSIS — I10 Essential (primary) hypertension: Secondary | ICD-10-CM

## 2019-03-25 DIAGNOSIS — Z23 Encounter for immunization: Secondary | ICD-10-CM

## 2019-03-25 DIAGNOSIS — F329 Major depressive disorder, single episode, unspecified: Secondary | ICD-10-CM | POA: Diagnosis not present

## 2019-03-25 DIAGNOSIS — M79604 Pain in right leg: Secondary | ICD-10-CM | POA: Diagnosis not present

## 2019-03-25 NOTE — Patient Instructions (Signed)
Nice to see you. Please check with the [facility] on therapy for depression and anxiety.  If your leg pain returns please let us know.

## 2019-03-25 NOTE — Progress Notes (Signed)
  [name], MD Phone: [phone]  Allen David is a 85 y.o. male who presents today for follow-up.  Hypertension: Typically 120-130/68-78.  Taking amlodipine.  No chest pain, shortness of breath, or edema.  Depression: Patient notes he does occasionally feel depressed.  Things get on his nerves easier than usual.  He is on Cymbalta and notes that has been beneficial.  He takes a half a Xanax occasionally at night.  That is prescribed through the [place].  No SI.  He has not completed therapy previously.  Right leg pain: Patient notes 3 days ago he developed some right leg pain posteriorly and it felt as though it was in the tendons of his hamstring radiating down to his right heel.  Notes it progressively improved and has now resolved.  Describes it as an aching pain.  Putting pressure on his leg caused it to hurt.  No injury.  Social History   Tobacco Use  Smoking Status Former Smoker  . Quit date: 05/19/1966  . Years since quitting: 52.8  Smokeless Tobacco Former Systems developer  Tobacco Comment   Quit in [place] see history of present illness  Objective  Physical Exam Vitals:   03/25/19 1548  BP: 110/70  Pulse: 92  Temp: (!) 97.4 F (36.3 C)  SpO2: 98%    BP Readings from Last 3 Encounters:  03/25/19 110/70  12/28/18 113/64  12/13/18 120/70   Wt Readings from Last 3 Encounters:  03/25/19 180 lb 6.4 oz (81.8 kg)  12/28/18 177 lb 12 oz (80.6 kg)  12/13/18 178 lb 3.2 oz (80.8 kg)    Physical Exam Constitutional:      General: He is not in acute distress.    Appearance: He is not diaphoretic.  Cardiovascular:     Rate and Rhythm: Normal rate and regular rhythm.     Heart sounds: Normal heart sounds.  Pulmonary:     Effort: Pulmonary effort is normal.     Breath sounds: Normal breath sounds.  Musculoskeletal:     Right lower leg: No edema.     Left lower leg: No edema.     Comments: Bilateral knees and lower legs with no tenderness, warmth, erythema, or  swelling, negative McMurray's, no tenderness over his distal hamstring, no swelling in his legs  Skin:    General: Skin is warm and dry.  Neurological:     Mental Status: He is alert.      Assessment/Plan: Please see individual problem list.  Anxiety and depression Relatively stable.  I have encouraged him to seek therapy through the [place] as this will likely be beneficial.  If he is unable to do that he will let us know.  He will continue Cymbalta.  Right leg pain Undetermined cause.  It has resolved at this time.  He will monitor for recurrence and if there is recurrence he will let us know.  Hypertension Adequately controlled.  Continue current regimen.    Orders Placed This Encounter  Procedures  . Flu Vaccine QUAD High Dose(Fluad)    No orders of the defined types were placed in this encounter.    [name], MD [place]

## 2019-03-25 NOTE — Assessment & Plan Note (Signed)
Relatively stable.  I have encouraged him to seek therapy through the [place] as this will likely be beneficial.  If he is unable to do that he will let us know.  He will continue Cymbalta.

## 2019-03-25 NOTE — Assessment & Plan Note (Signed)
Undetermined cause.  It has resolved at this time.  He will monitor for recurrence and if there is recurrence he will let us know.

## 2019-03-25 NOTE — Assessment & Plan Note (Signed)
Adequately controlled.  Continue current regimen. 

## 2019-03-29 DIAGNOSIS — M48062 Spinal stenosis, lumbar region with neurogenic claudication: Secondary | ICD-10-CM | POA: Diagnosis not present

## 2019-03-29 DIAGNOSIS — M5136 Other intervertebral disc degeneration, lumbar region: Secondary | ICD-10-CM | POA: Diagnosis not present

## 2019-03-29 DIAGNOSIS — M5416 Radiculopathy, lumbar region: Secondary | ICD-10-CM | POA: Diagnosis not present

## 2019-04-26 DIAGNOSIS — M48062 Spinal stenosis, lumbar region with neurogenic claudication: Secondary | ICD-10-CM | POA: Diagnosis not present

## 2019-04-26 DIAGNOSIS — M5136 Other intervertebral disc degeneration, lumbar region: Secondary | ICD-10-CM | POA: Diagnosis not present

## 2019-04-26 DIAGNOSIS — G8929 Other chronic pain: Secondary | ICD-10-CM | POA: Diagnosis not present

## 2019-04-26 DIAGNOSIS — M5416 Radiculopathy, lumbar region: Secondary | ICD-10-CM | POA: Diagnosis not present

## 2019-04-26 DIAGNOSIS — M5441 Lumbago with sciatica, right side: Secondary | ICD-10-CM | POA: Diagnosis not present

## 2019-04-26 DIAGNOSIS — M5442 Lumbago with sciatica, left side: Secondary | ICD-10-CM | POA: Diagnosis not present

## 2019-04-26 DIAGNOSIS — M5030 Other cervical disc degeneration, unspecified cervical region: Secondary | ICD-10-CM | POA: Diagnosis not present

## 2019-07-29 ENCOUNTER — Other Ambulatory Visit: Payer: Self-pay

## 2019-07-29 ENCOUNTER — Encounter: Payer: Self-pay | Admitting: Family Medicine

## 2019-07-29 ENCOUNTER — Ambulatory Visit (INDEPENDENT_AMBULATORY_CARE_PROVIDER_SITE_OTHER): Payer: Medicare Other | Admitting: Family Medicine

## 2019-07-29 DIAGNOSIS — E782 Mixed hyperlipidemia: Secondary | ICD-10-CM | POA: Diagnosis not present

## 2019-07-29 DIAGNOSIS — K589 Irritable bowel syndrome without diarrhea: Secondary | ICD-10-CM

## 2019-07-29 DIAGNOSIS — G8929 Other chronic pain: Secondary | ICD-10-CM | POA: Diagnosis not present

## 2019-07-29 DIAGNOSIS — I10 Essential (primary) hypertension: Secondary | ICD-10-CM | POA: Diagnosis not present

## 2019-07-29 DIAGNOSIS — M5442 Lumbago with sciatica, left side: Secondary | ICD-10-CM | POA: Diagnosis not present

## 2019-07-29 DIAGNOSIS — F419 Anxiety disorder, unspecified: Secondary | ICD-10-CM | POA: Diagnosis not present

## 2019-07-29 NOTE — Progress Notes (Signed)
Virtual Visit via telephone note  This visit type was conducted due to national recommendations for restrictions regarding the COVID-19 pandemic (e.g. social distancing).  This format is felt to be most appropriate for this patient at this time.  All issues noted in this document were discussed and addressed.  No physical exam was performed (except for noted visual exam findings with Video Visits).   I connected with Rachael Fee today at  1:15 PM EST by telephone and verified that I am speaking with the correct person using two identifiers. Location patient: home Location provider: home office Persons participating in the virtual visit: patient, provider, Cordella Register (daughter)  I discussed the limitations, risks, security and privacy concerns of performing an evaluation and management service by telephone and the availability of in person appointments. I also discussed with the patient that there may be a patient responsible charge related to this service. The patient expressed understanding and agreed to proceed.  Interactive audio and video telecommunications were attempted between this provider and patient, however failed, due to patient having technical difficulties OR patient did not have access to video capability.  We continued and completed visit with audio only.   Reason for visit: Follow-up.  HPI: Hypertension: 120/72 today.  Taking amlodipine.  No chest pain, shortness of breath, or edema.  Hyperlipidemia: Taking Lipitor.  No right upper quadrant pain.  No myalgias.  IBS: Patient does note that his bowels do bother him at times.  Sometimes his stools are lumpy.  He takes Pepto-Bismol and that is helpful.  Chronic back pain: Patient had injections in his back recently.  He notes that has helped some.  He realizes its not going to bring his back back to normal.  Anxiety: Patient notes he does have some anxiety though depends on what is going on in his life.  Notes its decently  well controlled.  Does not occur all the time.  He is on Cymbalta.   ROS: See pertinent positives and negatives per HPI.  Past Medical History:  Diagnosis Date  . Anxiety   . Back injury    with chronic pain  . CAD in native artery   . Chronic low back pain   . Depression   . ED (erectile dysfunction)   . Elevated PSA   . HLD (hyperlipidemia)   . HTN (hypertension)   . IBS (irritable bowel syndrome)   . Malaise and fatigue   . Pernicious anemia     Past Surgical History:  Procedure Laterality Date  . CARPAL TUNNEL RELEASE    . EYE SURGERY    . VASECTOMY      Family History  Problem Relation Age of Onset  . Stroke Mother   . Hypertension Mother   . Ulcers Mother   . Cancer Father        multiple myeloma  . Diabetes Sister   . Diabetes Brother   . Diabetes Brother     SOCIAL HX: Former smoker   Current Outpatient Medications:  .  ALPRAZolam (XANAX) 1 MG tablet, Take 1 mg by mouth 4 (four) times daily as needed for sleep., Disp: , Rfl:  .  amLODipine (NORVASC) 5 MG tablet, Take 5 mg by mouth daily., Disp: , Rfl:  .  atorvastatin (LIPITOR) 20 MG tablet, Take 1 tablet (20 mg total) by mouth daily., Disp: 90 tablet, Rfl: 3 .  baclofen (LIORESAL) 10 MG tablet, Take 1 tablet (10 mg total) by mouth 3 (three) times daily as  needed for muscle spasms., Disp: 30 each, Rfl: 0 .  DULoxetine (CYMBALTA) 60 MG capsule, Take 60 mg by mouth daily., Disp: , Rfl:  .  levothyroxine (SYNTHROID, LEVOTHROID) 25 MCG tablet, Take 1 tablet (25 mcg total) by mouth daily before breakfast., Disp: , Rfl:   Current Facility-Administered Medications:  .  betamethasone acetate-betamethasone sodium phosphate (CELESTONE) injection 3 mg, 3 mg, Intramuscular, Once, Amalia Hailey, Dorathy Daft, DPM  EXAM: This was a telehealth telephone visit and thus no physical exam was completed.  ASSESSMENT AND PLAN:  Discussed the following assessment and plan:  Hypertension Adequate control.  Continue current  regimen.  Anxiety Stable.  Continue Cymbalta.  Chronic low back pain Improved to a certain degree with injections.  He will continue to monitor.  IBS (irritable bowel syndrome) Mild symptoms.  He will monitor.  Hyperlipidemia Continue Lipitor.  Check labs as outlined below.   Orders Placed This Encounter  Procedures  . Comp Met (CMET)    Standing Status:   Future    Standing Expiration Date:   07/30/2020  . Lipid panel    Standing Status:   Future    Standing Expiration Date:   07/30/2020    No orders of the defined types were placed in this encounter.    I discussed the assessment and treatment plan with the patient. The patient was provided an opportunity to ask questions and all were answered. The patient agreed with the plan and demonstrated an understanding of the instructions.   The patient was advised to call back or seek an in-person evaluation if the symptoms worsen or if the condition fails to improve as anticipated.  I provided 13 minutes of non-face-to-face time during this encounter.   [name], MD

## 2019-07-31 DIAGNOSIS — K589 Irritable bowel syndrome without diarrhea: Secondary | ICD-10-CM | POA: Insufficient documentation

## 2019-07-31 NOTE — Assessment & Plan Note (Signed)
Continue Lipitor.  Check labs as outlined below.

## 2019-07-31 NOTE — Assessment & Plan Note (Signed)
Improved to a certain degree with injections.  He will continue to monitor.

## 2019-07-31 NOTE — Assessment & Plan Note (Signed)
Adequate control. Continue current regimen.  

## 2019-07-31 NOTE — Assessment & Plan Note (Signed)
Mild symptoms.  He will monitor.

## 2019-07-31 NOTE — Assessment & Plan Note (Signed)
Stable.  Continue Cymbalta. 

## 2019-09-06 ENCOUNTER — Telehealth: Payer: Self-pay | Admitting: Family Medicine

## 2019-09-06 NOTE — Telephone Encounter (Signed)
LVM to schedule labs and pcp follow up

## 2019-09-16 DIAGNOSIS — M5416 Radiculopathy, lumbar region: Secondary | ICD-10-CM | POA: Diagnosis not present

## 2019-09-16 DIAGNOSIS — M48062 Spinal stenosis, lumbar region with neurogenic claudication: Secondary | ICD-10-CM | POA: Diagnosis not present

## 2019-09-16 DIAGNOSIS — M5136 Other intervertebral disc degeneration, lumbar region: Secondary | ICD-10-CM | POA: Diagnosis not present

## 2019-09-20 ENCOUNTER — Other Ambulatory Visit: Payer: Medicare Other

## 2019-10-04 ENCOUNTER — Other Ambulatory Visit: Payer: Self-pay

## 2019-10-05 ENCOUNTER — Other Ambulatory Visit: Payer: Medicare Other

## 2019-10-16 ENCOUNTER — Ambulatory Visit
Admission: EM | Admit: 2019-10-16 | Discharge: 2019-10-16 | Disposition: A | Discharge status: Home / Self Care | Payer: Medicare Other | Attending: Family Medicine | Admitting: Family Medicine

## 2019-10-16 ENCOUNTER — Other Ambulatory Visit: Payer: Self-pay

## 2019-10-16 ENCOUNTER — Encounter: Payer: Self-pay | Admitting: Emergency Medicine

## 2019-10-16 DIAGNOSIS — N390 Urinary tract infection, site not specified: Secondary | ICD-10-CM | POA: Diagnosis not present

## 2019-10-16 LAB — URINALYSIS, COMPLETE (UACMP) WITH MICROSCOPIC
Bilirubin Urine: NEGATIVE
Glucose, UA: NEGATIVE mg/dL
Ketones, ur: NEGATIVE mg/dL
Nitrite: NEGATIVE
Protein, ur: 100 mg/dL — AB
Specific Gravity, Urine: 1.025 (ref 1.005–1.030)
WBC, UA: >50 WBC/hpf (ref 0–5)
pH: 5.5 (ref 5.0–8.0)

## 2019-10-16 MED ORDER — CIPROFLOXACIN HCL 500 MG PO TABS: 500.0000 mg | ORAL_TABLET | Freq: Two times a day (BID) | ORAL | 0 refills | Status: DC

## 2019-10-16 NOTE — Discharge Instructions (Addendum)
It was very nice seeing you today in clinic. Thank you for entrusting me with your care.  ° °As discussed, your urine is POSITIVE for infection. Will approach treatment as follows: ° °Prescription has been sent to your pharmacy for antibiotics.  °Please pick up and take as directed. FINISH the entire course of medication even if you are feeling better.  °A culture will be sent on your provided sample. If it comes back resistant to what I have prescribed you, someone will call you and let you know that we will need to change antibiotics. °Increase fluid intake as much as possible to flush your urinary tract.  °Water is always the best.  °Avoid caffeine until your infection clears up, as it can contribute to painful bladder spasms.  °May use Tylenol and/or Ibuprofen as needed for pain/fever. ° °Make arrangements to follow up with your regular doctor in 1 week for re-evaluation. If your symptoms/condition worsens, please seek follow up care either here or in the ER. Please remember, our [facility] providers are "right here with you" when you need us.  ° °Again, it was my pleasure to take care of you today. Thank you for choosing our clinic. I hope that you start to feel better quickly.  ° °Jlen Wintle, MSN, APRN, FNP-C, CEN °Advanced Practice Provider °[facility] ° °

## 2019-10-16 NOTE — ED Provider Notes (Signed)
[name], [place]   Name: Allen David DOB: [birth year] MRN: [id number] CSN: [id number] PCP: Leone Haven, MD  Arrival date and time:  10/16/19 1444  Chief Complaint:  Back Pain  NOTE: Prior to seeing the patient today, I have reviewed the triage nursing documentation and vital signs. Clinical staff has updated patient's PMH/PSHx, current medication list, and drug allergies/intolerances to ensure comprehensive history available to assist in medical decision making.   History:   HPI: Allen David is a 86 y.o. male who presents today with complaints of urinary symptoms that began 2-3 weeks ago. He complains of dysuria, frequency, and urgency. He has not appreciated any gross hematuria, nor has he noticed his urine being malodorous. Urine appears to be more cloudy. Patient denies any associated nausea, vomiting, fever, or chills. PMH (+) for chronic back pain, however patient notes that his back pain has been worse as of late; describes pain as being "different" than his normal back pain. He has not experienced any pain in his flank areas or lower abdomen. Patient advises that he does not have a past medical history that is significant for recurrent urinary tract infections, STIs, or urolithiasis. He denies pain in his penis, testicles, or scrotum; no swelling. No penile bleeding or discharge. Despite his symptoms, patient has not taken any over the counter interventions to help improve/relieve his reported symptoms at home.   Past Medical History:  Diagnosis Date  . Anxiety   . Back injury    with chronic pain  . CAD in native artery   . Chronic low back pain   . Depression   . ED (erectile dysfunction)   . Elevated PSA   . HLD (hyperlipidemia)   . HTN (hypertension)   . IBS (irritable bowel syndrome)   . Malaise and fatigue   . Pernicious anemia     Past Surgical History:  Procedure Laterality Date  . CARPAL TUNNEL RELEASE    . EYE SURGERY    . VASECTOMY      Family  History  Problem Relation Age of Onset  . Stroke Mother   . Hypertension Mother   . Ulcers Mother   . Cancer Father        multiple myeloma  . Diabetes Sister   . Diabetes Brother   . Diabetes Brother     Social History   Tobacco Use  . Smoking status: Former Smoker    Quit date: 05/19/1966    Years since quitting: 53.4  . Smokeless tobacco: Former Systems developer  . Tobacco comment: Quit in 1968  Substance Use Topics  . Alcohol use: Yes    Alcohol/week: 0.0 - 1.0 standard drinks    Comment: daily  . Drug use: No    Patient Active Problem List   Diagnosis Date Noted  . IBS (irritable bowel syndrome) 07/31/2019  . Right leg pain 03/25/2019  . Branch retinal vein occlusion 12/13/2018  . Muscle cramps 11/03/2018  . Difficulty hearing 09/20/2018  . Leg pain 09/20/2018  . Proteinuria 05/30/2018  . Carpal tunnel syndrome 05/30/2018  . Fatty liver 05/14/2018  . Anxiety 05/14/2018  . Aortic atherosclerosis ([place]) 01/05/2018  . Neuropathy 12/26/2017  . Allergic rhinitis 09/16/2017  . Muscular chest pain 08/21/2017  . Adrenal adenoma 03/11/2017  . Incidental lung nodule, > 79m and < 882m10/24/2018  . Right foot pain 11/04/2016  . Other specified abdominal hernia without obstruction or gangrene 11/04/2016  . Skin lesion 01/01/2015  . Arthralgia of both  knees 09/04/2014  . Pernicious anemia 12/20/2013  . Hypertension 06/03/2012  . Chronic low back pain 06/03/2012  . Anxiety and depression 06/03/2012  . Hyperlipidemia 01/20/2012  . CAD, NATIVE VESSEL 01/09/2010    Home Medications:    Current Facility-Administered Medications for the 10/16/19 encounter [facility] Encounter)  Medication  . betamethasone acetate-betamethasone sodium phosphate (CELESTONE) injection 3 mg   Current Meds  Medication Sig  . ALPRAZolam (XANAX) 1 MG tablet Take 1 mg by mouth 4 (four) times daily as needed for sleep.  Marland Kitchen amLODipine (NORVASC) 5 MG tablet Take 5 mg by mouth daily.  . DULoxetine (CYMBALTA) 60  MG capsule Take 60 mg by mouth daily.  Marland Kitchen levothyroxine (SYNTHROID, LEVOTHROID) 25 MCG tablet Take 1 tablet (25 mcg total) by mouth daily before breakfast.    Allergies:   Lexapro [escitalopram oxalate] and Tetanus toxoids  Review of Systems (ROS):  Review of systems NEGATIVE unless otherwise noted in narrative H&P section.   Vital Signs: Today's Vitals   10/16/19 1451 10/16/19 1452 10/16/19 1456 10/16/19 1531  BP:   129/78   Pulse:   74   Resp:   16   Temp:   97.8 F (36.6 C)   TempSrc:   Oral   SpO2:   99%   Weight:  179 lb 14.3 oz (81.6 kg)    Height:  5' 10" (1.778 m)    PainSc: 10-Worst pain ever   10-Worst pain ever    Physical Exam: Physical Exam  Constitutional: He is oriented to person, place, and time and well-developed, well-nourished, and in no distress.  HENT:  Head: Normocephalic and atraumatic.  Mouth/Throat: Uvula is midline, oropharynx is clear and moist and mucous membranes are normal.  (+) HOH  Eyes: Pupils are equal, round, and reactive to light.  Cardiovascular: Normal rate, regular rhythm, normal heart sounds and intact distal pulses.  Pulmonary/Chest: Effort normal and breath sounds normal.  Abdominal: Soft. Normal appearance and bowel sounds are normal. He exhibits no distension. There is abdominal tenderness in the suprapubic area. There is no CVA tenderness.  Musculoskeletal:     Lumbar back: Pain (chronic) present. No swelling, spasms or tenderness. Normal range of motion.     Comments: No midline neck/back pain or gross deformities.   Neurological: He is alert and oriented to person, place, and time. Gait normal.  Skin: Skin is warm and dry. No rash noted. He is not diaphoretic.  Psychiatric: Memory, affect and judgment normal. His mood appears anxious.  Nursing note and vitals reviewed.   Urgent Care Treatments / Results:   Orders Placed This Encounter  Procedures  . Urine culture  . Urinalysis, Complete w Microscopic    LABS: PLEASE  NOTE: all labs that were ordered this encounter are listed, however only abnormal results are displayed. Labs Reviewed  URINALYSIS, COMPLETE (UACMP) WITH MICROSCOPIC - Abnormal; Notable for the following components:      Result Value   APPearance CLOUDY (*)    Hgb urine dipstick SMALL (*)    Protein, ur 100 (*)    Leukocytes,Ua MODERATE (*)    Bacteria, UA MANY (*)    All other components within normal limits  URINE CULTURE    EKG: -None  RADIOLOGY: No results found.  PROCEDURES: Procedures  MEDICATIONS RECEIVED THIS VISIT: Medications - No data to display  PERTINENT CLINICAL COURSE NOTES/UPDATES:   Initial Impression / Assessment and Plan / Urgent Care Course:  Pertinent labs & imaging results that were available during  my care of the patient were personally reviewed by me and considered in my medical decision making (see lab/imaging section of note for values and interpretations).  Allen David is a 86 y.o. male who presents to [facility] today with complaints of Back Pain  Patient is well appearing overall in clinic today. He does not appear to be in any acute distress. Presenting symptoms (see HPI) and exam as documented above. UA was (+) for infection. Past urine cultures reviewed. He has grown out multiple pathogens in the past; (+) resistance of PCN. Reflex culture being sent today. Will proceed as follows:  o Will treat with a 5 day course of ciprofloxacin. Patient encouraged to complete the entire course of antibiotics even if he begins to feel better. He was advised that if culture demonstrates resistance to the prescribed antibiotic, he will be contacted and advised of the need to change the antibiotic being used to treat his infection.   o Patient encouraged to increase his fluid intake as much as possible. Discussed that water is always best to flush the urinary tract. He was advised to avoid caffeine containing fluids until his infections clears, as  caffeine can cause him to experience painful bladder spasms.   o May use Tylenol and/or Ibuprofen as needed for pain/fever.  Discussed follow up with primary care physician in 1 week for re-evaluation. I have reviewed the follow up and strict return precautions for any new or worsening symptoms. Patient is aware of symptoms that would be deemed urgent/emergent, and would thus require further evaluation either here or in the emergency department. At the time of discharge, he verbalized understanding and consent with the discharge plan as it was reviewed with him. All questions were fielded by provider and/or clinic staff prior to patient discharge.    Final Clinical Impressions / Urgent Care Diagnoses:   Final diagnoses:  Urinary tract infection without hematuria, site unspecified    New Prescriptions:  [place] Controlled Substance Registry consulted? Not Applicable  Meds ordered this encounter  Medications  . ciprofloxacin (CIPRO) 500 MG tablet    Sig: Take 1 tablet (500 mg total) by mouth every 12 (twelve) hours.    Dispense:  10 tablet    Refill:  0    Recommended Follow up Care:  Patient encouraged to follow up with the following provider within the specified time frame, or sooner as dictated by the severity of his symptoms. As always, he was instructed that for any urgent/emergent care needs, he should seek care either here or in the emergency department for more immediate evaluation.  Follow-up Information    Leone Haven, MD In 1 week.   Specialty: Family Medicine Why: General reassessment of symptoms if not improving Contact information: [address] [place] [address] [phone]         NOTE: This note was prepared using Dragon dictation software along with smaller phrase technology. Despite my best ability to proofread, there is the potential that transcriptional errors may still occur from this process, and are completely unintentional.    Karen Kitchens, NP 10/16/19 2329

## 2019-10-16 NOTE — ED Triage Notes (Signed)
Patient has chronic back pain and gets injections in his back.  Patient states that his back pain is worse and states that his urine has been cloudy.

## 2019-10-18 ENCOUNTER — Other Ambulatory Visit (INDEPENDENT_AMBULATORY_CARE_PROVIDER_SITE_OTHER): Payer: Medicare Other

## 2019-10-18 ENCOUNTER — Other Ambulatory Visit: Payer: Self-pay

## 2019-10-18 DIAGNOSIS — I10 Essential (primary) hypertension: Secondary | ICD-10-CM | POA: Diagnosis not present

## 2019-10-18 DIAGNOSIS — E782 Mixed hyperlipidemia: Secondary | ICD-10-CM

## 2019-10-18 LAB — COMPREHENSIVE METABOLIC PANEL
ALT: 17 U/L (ref 0–53)
AST: 23 U/L (ref 0–37)
Albumin: 4.4 g/dL (ref 3.5–5.2)
Alkaline Phosphatase: 74 U/L (ref 39–117)
BUN: 30 mg/dL — ABNORMAL HIGH (ref 6–23)
CO2: 24 mEq/L (ref 19–32)
Calcium: 9.6 mg/dL (ref 8.4–10.5)
Chloride: 103 mEq/L (ref 96–112)
Creatinine, Ser: 1.16 mg/dL (ref 0.40–1.50)
GFR: 59.11 mL/min — ABNORMAL LOW (ref >60.00)
Glucose, Bld: 85 mg/dL (ref 70–99)
Potassium: 3.9 mEq/L (ref 3.5–5.1)
Sodium: 136 mEq/L (ref 135–145)
Total Bilirubin: 0.6 mg/dL (ref 0.2–1.2)
Total Protein: 7.7 g/dL (ref 6.0–8.3)

## 2019-10-18 LAB — LIPID PANEL
Cholesterol: 113 mg/dL (ref 0–200)
HDL: 32.1 mg/dL — ABNORMAL LOW (ref >39.00)
LDL Cholesterol: 62 mg/dL (ref 0–99)
NonHDL: 80.83
Total CHOL/HDL Ratio: 4
Triglycerides: 95 mg/dL (ref 0.0–149.0)
VLDL: 19 mg/dL (ref 0.0–40.0)

## 2019-10-19 LAB — URINE CULTURE: Culture: >=100000 — AB

## 2019-10-20 ENCOUNTER — Other Ambulatory Visit: Payer: Self-pay | Admitting: Family Medicine

## 2019-10-20 DIAGNOSIS — N179 Acute kidney failure, unspecified: Secondary | ICD-10-CM

## 2019-10-25 DIAGNOSIS — M48062 Spinal stenosis, lumbar region with neurogenic claudication: Secondary | ICD-10-CM | POA: Diagnosis not present

## 2019-10-25 DIAGNOSIS — M5416 Radiculopathy, lumbar region: Secondary | ICD-10-CM | POA: Diagnosis not present

## 2019-10-25 DIAGNOSIS — M5030 Other cervical disc degeneration, unspecified cervical region: Secondary | ICD-10-CM | POA: Diagnosis not present

## 2019-10-25 DIAGNOSIS — M5136 Other intervertebral disc degeneration, lumbar region: Secondary | ICD-10-CM | POA: Diagnosis not present

## 2019-10-31 ENCOUNTER — Other Ambulatory Visit: Payer: Self-pay

## 2019-10-31 ENCOUNTER — Other Ambulatory Visit (INDEPENDENT_AMBULATORY_CARE_PROVIDER_SITE_OTHER): Payer: Medicare Other

## 2019-10-31 DIAGNOSIS — N179 Acute kidney failure, unspecified: Secondary | ICD-10-CM

## 2019-11-01 ENCOUNTER — Telehealth: Payer: Self-pay

## 2019-11-01 LAB — BASIC METABOLIC PANEL
BUN: 27 mg/dL — ABNORMAL HIGH (ref 6–23)
CO2: 23 mEq/L (ref 19–32)
Calcium: 9.4 mg/dL (ref 8.4–10.5)
Chloride: 107 mEq/L (ref 96–112)
Creatinine, Ser: 1.31 mg/dL (ref 0.40–1.50)
GFR: 51.36 mL/min — ABNORMAL LOW (ref >60.00)
Glucose, Bld: 82 mg/dL (ref 70–99)
Potassium: 4.4 mEq/L (ref 3.5–5.1)
Sodium: 138 mEq/L (ref 135–145)

## 2019-11-01 NOTE — Telephone Encounter (Signed)
-----   Message from Leone Haven, MD sent at 11/01/2019 11:43 AM EDT ----- Please let the patient know his kidney function worsened again slightly. Please see if he has been taking any antiinflammatories such as ibuprofen or aleve. Is he off of the cipro? How much water is he drinking? Is he urinating normally?

## 2019-11-16 ENCOUNTER — Encounter: Payer: Self-pay | Admitting: Family Medicine

## 2019-11-16 ENCOUNTER — Other Ambulatory Visit: Payer: Self-pay

## 2019-11-16 ENCOUNTER — Ambulatory Visit (INDEPENDENT_AMBULATORY_CARE_PROVIDER_SITE_OTHER): Payer: Medicare Other | Admitting: Family Medicine

## 2019-11-16 VITALS — BP 130/70 | HR 89 | Temp 97.6°F | Ht 72.0 in | Wt 177.6 lb

## 2019-11-16 DIAGNOSIS — N3000 Acute cystitis without hematuria: Secondary | ICD-10-CM

## 2019-11-16 DIAGNOSIS — R252 Cramp and spasm: Secondary | ICD-10-CM | POA: Diagnosis not present

## 2019-11-16 DIAGNOSIS — I10 Essential (primary) hypertension: Secondary | ICD-10-CM

## 2019-11-16 DIAGNOSIS — F419 Anxiety disorder, unspecified: Secondary | ICD-10-CM | POA: Diagnosis not present

## 2019-11-16 DIAGNOSIS — N179 Acute kidney failure, unspecified: Secondary | ICD-10-CM | POA: Insufficient documentation

## 2019-11-16 DIAGNOSIS — M25512 Pain in left shoulder: Secondary | ICD-10-CM | POA: Diagnosis not present

## 2019-11-16 DIAGNOSIS — N390 Urinary tract infection, site not specified: Secondary | ICD-10-CM | POA: Insufficient documentation

## 2019-11-16 DIAGNOSIS — G8929 Other chronic pain: Secondary | ICD-10-CM

## 2019-11-16 LAB — BASIC METABOLIC PANEL
BUN: 19 mg/dL (ref 6–23)
CO2: 26 mEq/L (ref 19–32)
Calcium: 9.2 mg/dL (ref 8.4–10.5)
Chloride: 106 mEq/L (ref 96–112)
Creatinine, Ser: 0.96 mg/dL (ref 0.40–1.50)
GFR: 73.52 mL/min (ref >60.00)
Glucose, Bld: 88 mg/dL (ref 70–99)
Potassium: 4 mEq/L (ref 3.5–5.1)
Sodium: 139 mEq/L (ref 135–145)

## 2019-11-16 NOTE — Assessment & Plan Note (Signed)
Possible rotator cuff issue versus arthritis.  Given shoulder exercises.  If not improving he will let us know.

## 2019-11-16 NOTE — Assessment & Plan Note (Signed)
Patient is unsure if he still having symptoms.  We will recheck a UA.

## 2019-11-16 NOTE — Assessment & Plan Note (Signed)
Adequately controlled.  We will continue his current regimen.

## 2019-11-16 NOTE — Assessment & Plan Note (Signed)
Chronic intermittent issue.  Discussed adequate hydration with water.  Discussed stretching.  Advised he could try a teaspoon of yellow mustard before bed to see if that would help.

## 2019-11-16 NOTE — Assessment & Plan Note (Signed)
Check BMET 

## 2019-11-16 NOTE — Patient Instructions (Addendum)
Nice to see you. Please continue to monitor your blood pressure. Please do the exercises for your shoulder. Please stretch and take in plenty of fluids.  You can try a teaspoon of yellow mustard nightly for the cramps. We will contact you regarding your Cymbalta.   Shoulder Impingement Syndrome Rehab Ask your health care provider which exercises are safe for you. Do exercises exactly as told by your health care provider and adjust them as directed. It is normal to feel mild stretching, pulling, tightness, or discomfort as you do these exercises. Stop right away if you feel sudden pain or your pain gets worse. Do not begin these exercises until told by your health care provider. Stretching and range-of-motion exercise This exercise warms up your muscles and joints and improves the movement and flexibility of your shoulder. This exercise also helps to relieve pain and stiffness. Passive horizontal adduction In passive adduction, you use your other hand to move the injured arm toward your body. The injured arm does not move on its own. In this movement, your arm is moved across your body in the horizontal plane (horizontal adduction). 1. Sit or stand and pull your left / right elbow across your chest, toward your other shoulder. Stop when you feel a gentle stretch in the back of your shoulder and upper arm. ? Keep your arm at shoulder height. ? Keep your arm as close to your body as you comfortably can. 2. Hold for __________ seconds. 3. Slowly return to the starting position. Repeat __________ times. Complete this exercise __________ times a day. Strengthening exercises These exercises build strength and endurance in your shoulder. Endurance is the ability to use your muscles for a long time, even after they get tired. External rotation, isometric This is an exercise in which you press the back of your wrist against a door frame without moving your shoulder joint (isometric). 1. Stand or sit in  a doorway, facing the door frame. 2. Bend your left / right elbow and place the back of your wrist against the door frame. Only the back of your wrist should be touching the frame. Keep your upper arm at your side. 3. Gently press your wrist against the door frame, as if you are trying to push your arm away from your abdomen (external rotation). Press as hard as you are able without pain. ? Avoid shrugging your shoulder while you press your wrist against the door frame. Keep your shoulder blade tucked down toward the middle of your back. 4. Hold for __________ seconds. 5. Slowly release the tension, and relax your muscles completely before you repeat the exercise. Repeat __________ times. Complete this exercise __________ times a day. Internal rotation, isometric This is an exercise in which you press your palm against a door frame without moving your shoulder joint (isometric). 1. Stand or sit in a doorway, facing the door frame. 2. Bend your left / right elbow and place the palm of your hand against the door frame. Only your palm should be touching the frame. Keep your upper arm at your side. 3. Gently press your hand against the door frame, as if you are trying to push your arm toward your abdomen (internal rotation). Press as hard as you are able without pain. ? Avoid shrugging your shoulder while you press your hand against the door frame. Keep your shoulder blade tucked down toward the middle of your back. 4. Hold for __________ seconds. 5. Slowly release the tension, and relax your muscles completely  before you repeat the exercise. Repeat __________ times. Complete this exercise __________ times a day. Scapular protraction, supine  1. Lie on your back on a firm surface (supine position). Hold a __________ weight in your left / right hand. 2. Raise your left / right arm straight into the air so your hand is directly above your shoulder joint. 3. Push the weight into the air so your shoulder  (scapula) lifts off the surface that you are lying on. The scapula will push up or forward (protraction). Do not move your head, neck, or back. 4. Hold for __________ seconds. 5. Slowly return to the starting position. Let your muscles relax completely before you repeat this exercise. Repeat __________ times. Complete this exercise __________ times a day. Scapular retraction  1. Sit in a stable chair without armrests, or stand up. 2. Secure an exercise band to a stable object in front of you so the band is at shoulder height. 3. Hold one end of the exercise band in each hand. Your palms should face down. 4. Squeeze your shoulder blades together (retraction) and move your elbows slightly behind you. Do not shrug your shoulders upward while you do this. 5. Hold for __________ seconds. 6. Slowly return to the starting position. Repeat __________ times. Complete this exercise __________ times a day. Shoulder extension  1. Sit in a stable chair without armrests, or stand up. 2. Secure an exercise band to a stable object in front of you so the band is above shoulder height. 3. Hold one end of the exercise band in each hand. 4. Straighten your elbows and lift your hands up to shoulder height. 5. Squeeze your shoulder blades together and pull your hands down to the sides of your thighs (extension). Stop when your hands are straight down by your sides. Do not let your hands go behind your body. 6. Hold for __________ seconds. 7. Slowly return to the starting position. Repeat __________ times. Complete this exercise __________ times a day. This information is not intended to replace advice given to you by your health care provider. Make sure you discuss any questions you have with your health care provider. Document Revised: 08/27/2018 Document Reviewed: 05/31/2018 Elsevier Patient Education  [place].

## 2019-11-16 NOTE — Assessment & Plan Note (Signed)
Continues to have some intermittent symptoms.  Will check with our clinical pharmacist regarding tapering off of Cymbalta and onto an alternative medication.

## 2019-11-16 NOTE — Progress Notes (Signed)
[name], MD Phone: [phone]  Allen David is a 86 y.o. male who presents today for f/u.  Hypertension: Typically 098J-191Y systolic.  Did note it was 782 systolic this morning and has never been that high.  Taking amlodipine.  No chest pain, shortness with, or edema.  Muscle cramps: Patient notes cramps in Allen David legs at night.  Occurs 2-3 times per week.  Allen David will take baclofen to help with this.  Allen David does not drink very much plain water and drinks lots of juices and lemonade.  Anxiety: Cymbalta is not helping quite as much as Allen David feels it should.  Allen David will occasionally have panic attacks.  Allen David will take a Xanax and that will help resolve those symptoms.  Chronic left shoulder pain: This is a chronic intermittent issue for many years.  No specific injury.  No prior x-rays or physical therapy.  Allen David will take Tylenol or use icy hot on it and the pain will go away.  UTI: Recently treated for UTI at urgent care with ciprofloxacin.  Allen David notes is difficult for him to tell if Allen David has any persistent symptoms as Allen David was having back pain with Allen David prior UTI and notes Allen David notes Allen David has chronic back pain  Social History   Tobacco Use  Smoking Status Former Smoker  . Quit date: 05/19/1966  . Years since quitting: 53.5  Smokeless Tobacco Former Systems developer  Tobacco Comment   Quit in [place] see history of present illness  Objective  Physical Exam Vitals:   11/16/19 1339  BP: 130/70  Pulse: 89  Temp: 97.6 F (36.4 C)  SpO2: 99%    BP Readings from Last 3 Encounters:  11/16/19 130/70  10/16/19 129/78  03/25/19 110/70   Wt Readings from Last 3 Encounters:  11/16/19 177 lb 9.6 oz (80.6 kg)  10/16/19 179 lb 14.3 oz (81.6 kg)  07/29/19 180 lb (81.6 kg)    Physical Exam Constitutional:      General: Allen David is not in acute distress.    Appearance: Allen David is not diaphoretic.  Cardiovascular:     Rate and Rhythm: Normal rate and regular rhythm.     Heart sounds: Normal heart sounds.   Pulmonary:     Effort: Pulmonary effort is normal.     Breath sounds: Normal breath sounds.  Musculoskeletal:     Right lower leg: No edema.     Left lower leg: No edema.     Comments: Left shoulder with no tenderness, bilateral shoulders with full range of motion with no discomfort actively, left shoulder with no discomfort with passive range of motion, slight discomfort on empty can testing bilaterally  Skin:    General: Skin is warm and dry.  Neurological:     Mental Status: Allen David is alert.      Assessment/Plan: Please see individual problem list.  Hypertension Adequately controlled.  We will continue Allen David current regimen.  Muscle cramps Chronic intermittent issue.  Discussed adequate hydration with water.  Discussed stretching.  Advised Allen David could try a teaspoon of yellow mustard before bed to see if that would help.  Anxiety Continues to have some intermittent symptoms.  Will check with our clinical pharmacist regarding tapering off of Cymbalta and onto an alternative medication.  Chronic left shoulder pain Possible rotator cuff issue versus arthritis.  Given shoulder exercises.  If not improving Allen David will let us know.  UTI (urinary tract infection) Patient is unsure if Allen David still having symptoms.  We will recheck a UA.  AKI (acute kidney injury) ([place]) Check BMET.   Orders Placed This Encounter  Procedures  . Basic Metabolic Panel (BMET)    No orders of the defined types were placed in this encounter.   This visit occurred during the SARS-CoV-2 public health emergency.  Safety protocols were in place, including screening questions prior to the visit, additional usage of staff PPE, and extensive cleaning of exam room while observing appropriate contact time as indicated for disinfecting solutions.    [name], MD [place]

## 2019-11-18 ENCOUNTER — Telehealth: Payer: Self-pay | Admitting: Family Medicine

## 2019-11-18 MED ORDER — BUSPIRONE HCL 5 MG PO TABS: 5.0000 mg | ORAL_TABLET | Freq: Two times a day (BID) | ORAL | 2 refills | Status: DC

## 2019-11-18 NOTE — Addendum Note (Signed)
Addended by: Leone Haven on: 11/18/2019 09:55 AM   Modules accepted: Orders

## 2019-11-18 NOTE — Telephone Encounter (Signed)
-----   Message from De Hollingshead, [facility] sent at 11/16/2019  2:23 PM EDT ----- Hey,   He's tough. He's tried fluoxetine (lost benefit), escitalopram (HTN), and sertraline (lost benefit). Because of the issue w/ escitalopram, I'm concerned about trying citalopram. I don't like paroxetine in elderly d/t anticholinergic effects. I'm suspicious of trying venlafaxine because of his previous issues w/ HTN.   Sounds like anxiety is the issue? Thoughts on adding buspirone for the anxiety? Vs referral to psych? Thoughts? Catie ----- Message ----- From: Leone Haven, MD Sent: 11/16/2019   2:08 PM EDT To: De Hollingshead, [facility]  Hey Catie,   I want to switch this patient from cymbalta to an SSRI. He is currently on 60 mg daily. Should I decrease to 30 mg for a week or 2 while starting the SSRI and then d/c the cymbalta and titrate the SSRI if needed?   Randall Hiss

## 2019-11-18 NOTE — Telephone Encounter (Signed)
Sent to pharmacy 

## 2019-11-18 NOTE — Telephone Encounter (Signed)
Please let the patient know that I heard back from her clinical pharmacist.  We would like to start the person on buspirone for his anxiety.  He would continue on his current dose of Cymbalta with this.  If he does not want to add this medicine the other option is seeing a psychiatrist to get their input given his issues with prior antianxiety medicines.

## 2019-11-18 NOTE — Telephone Encounter (Signed)
I called the patient and he is okay with starting the medication Buspirone and you can send to [facility].  I also informed the patient the he is to continue the dose of Cymbalta he is on and he understood.  Malayna Noori,cma

## 2019-11-22 ENCOUNTER — Telehealth: Payer: Self-pay | Admitting: Family Medicine

## 2019-11-22 NOTE — Telephone Encounter (Signed)
Patient daughter (DPR) called on 11/19/19 [employer] with this question, "Father has a new medication Buspirone. Wants to know if it can be taken with the other medication" Tried to reach daughter to make sure received the needed information no answer left voicemail.

## 2019-11-23 ENCOUNTER — Other Ambulatory Visit: Payer: Self-pay

## 2019-11-23 ENCOUNTER — Other Ambulatory Visit (INDEPENDENT_AMBULATORY_CARE_PROVIDER_SITE_OTHER): Payer: Medicare Other

## 2019-11-23 DIAGNOSIS — N3000 Acute cystitis without hematuria: Secondary | ICD-10-CM

## 2019-11-23 LAB — POCT URINALYSIS DIPSTICK
Bilirubin, UA: NEGATIVE
Blood, UA: NEGATIVE
Glucose, UA: NEGATIVE
Ketones, UA: NEGATIVE
Leukocytes, UA: NEGATIVE
Nitrite, UA: NEGATIVE
Protein, UA: NEGATIVE
Spec Grav, UA: 1.02 (ref 1.010–1.025)
Urobilinogen, UA: 4 E.U./dL — AB
pH, UA: 7 (ref 5.0–8.0)

## 2019-12-19 ENCOUNTER — Ambulatory Visit: Payer: No Typology Code available for payment source

## 2020-02-03 DIAGNOSIS — M5416 Radiculopathy, lumbar region: Secondary | ICD-10-CM | POA: Diagnosis not present

## 2020-02-03 DIAGNOSIS — M5136 Other intervertebral disc degeneration, lumbar region: Secondary | ICD-10-CM | POA: Diagnosis not present

## 2020-02-03 DIAGNOSIS — M48062 Spinal stenosis, lumbar region with neurogenic claudication: Secondary | ICD-10-CM | POA: Diagnosis not present

## 2020-02-20 ENCOUNTER — Ambulatory Visit: Payer: Medicare Other | Admitting: Family Medicine

## 2020-02-21 ENCOUNTER — Ambulatory Visit (INDEPENDENT_AMBULATORY_CARE_PROVIDER_SITE_OTHER): Payer: Medicare Other

## 2020-02-21 VITALS — Ht 72.0 in | Wt 177.0 lb

## 2020-02-21 DIAGNOSIS — Z0000 Encounter for general adult medical examination without abnormal findings: Secondary | ICD-10-CM

## 2020-02-21 NOTE — Progress Notes (Signed)
Subjective:   Allen David is a 86 y.o. male who presents for Medicare Annual/Subsequent preventive examination.  Review of Systems    No ROS.  Medicare Wellness Virtual Visit.  Cardiac Risk Factors include: advanced age (>55mn, >>30women);male gender;hypertension     Objective:    Today's Vitals   02/21/20 0934  Weight: 177 lb (80.3 kg)  Height: 6' (1.829 m)   Body mass index is 24.01 kg/m.  Advanced Directives 02/21/2020 10/16/2019 12/16/2018 11/17/2016  Does Patient Have a Medical Advance Directive? Yes No No No  Type of Advance Directive [facility] Does patient want to make changes to medical advance directive? No - Patient declined - - -  Copy of [facility] Chart? No - copy requested - - -  Would patient like information on creating a medical advance directive? - - No - Patient declined Yes (MAU/Ambulatory/Procedural Areas - Information given)  Some encounter information is confidential and restricted. Go to Review Flowsheets activity to see all data.    Current Medications (verified) Outpatient Encounter Medications as of 02/21/2020  Medication Sig  . ALPRAZolam (XANAX) 1 MG tablet Take 1 mg by mouth 4 (four) times daily as needed for sleep.  .Marland KitchenamLODipine (NORVASC) 5 MG tablet Take 5 mg by mouth daily.  .Marland Kitchenatorvastatin (LIPITOR) 20 MG tablet Take 1 tablet (20 mg total) by mouth daily.  . baclofen (LIORESAL) 10 MG tablet Take 1 tablet (10 mg total) by mouth 3 (three) times daily as needed for muscle spasms.  . busPIRone (BUSPAR) 5 MG tablet Take 1 tablet (5 mg total) by mouth 2 (two) times daily.  . DULoxetine (CYMBALTA) 60 MG capsule Take 60 mg by mouth daily.  .Marland Kitchenlevothyroxine (SYNTHROID, LEVOTHROID) 25 MCG tablet Take 1 tablet (25 mcg total) by mouth daily before breakfast.  . [DISCONTINUED] ciprofloxacin (CIPRO) 500 MG tablet Take 1 tablet (500 mg total) by mouth every 12 (twelve) hours.   Facility-Administered Encounter  Medications as of 02/21/2020  Medication  . betamethasone acetate-betamethasone sodium phosphate (CELESTONE) injection 3 mg    Allergies (verified) Lexapro [escitalopram oxalate] and Tetanus toxoids   History: Past Medical History:  Diagnosis Date  . Anxiety   . Back injury    with chronic pain  . CAD in native artery   . Chronic low back pain   . Depression   . ED (erectile dysfunction)   . Elevated PSA   . HLD (hyperlipidemia)   . HTN (hypertension)   . IBS (irritable bowel syndrome)   . Malaise and fatigue   . Pernicious anemia    Past Surgical History:  Procedure Laterality Date  . CARPAL TUNNEL RELEASE    . EYE SURGERY    . VASECTOMY     Family History  Problem Relation Age of Onset  . Stroke Mother   . Hypertension Mother   . Ulcers Mother   . Cancer Father        multiple myeloma  . Diabetes Sister   . Diabetes Brother   . Diabetes Brother    Social History   Socioeconomic History  . Marital status: Single    Spouse name: Not on file  . Number of children: Not on file  . Years of education: Not on file  . Highest education level: Not on file  Occupational History  . Occupation: Retired  Tobacco Use  . Smoking status: Former Smoker    Quit date: 05/19/1966  Years since quitting: 53.7  . Smokeless tobacco: Former Systems developer  . Tobacco comment: Quit in 1968  Vaping Use  . Vaping Use: Never used  Substance and Sexual Activity  . Alcohol use: Yes    Alcohol/week: 0.0 - 1.0 standard drinks    Comment: daily  . Drug use: No  . Sexual activity: Not Currently  Other Topics Concern  . Not on file  Social History Narrative   Lives in [place] alone. Children, 4 girls.      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      Regularly exercise- rides bike and mows lawn. Divorced.   Social Determinants of Health   Financial Resource Strain: Low Risk   . Difficulty of Paying Living Expenses: Not hard at all   Food Insecurity: No Food Insecurity  . Worried About Charity fundraiser in the Last Year: Never true  . Ran Out of Food in the Last Year: Never true  Transportation Needs: No Transportation Needs  . Lack of Transportation (Medical): No  . Lack of Transportation (Non-Medical): No  Physical Activity:   . Days of Exercise per Week: Not on file  . Minutes of Exercise per Session: Not on file  Stress: No Stress Concern Present  . Feeling of Stress : Only a little  Social Connections:   . Frequency of Communication with Friends and Family: Not on file  . Frequency of Social Gatherings with Friends and Family: Not on file  . Attends Religious Services: Not on file  . Active Member of Clubs or Organizations: Not on file  . Attends Archivist Meetings: Not on file  . Marital Status: Not on file    Tobacco Counseling Counseling given: Not Answered Comment: Quit in 1968   Clinical Intake:  Pre-visit preparation completed: Yes        Diabetes: No  How often do you need to have someone help you when you read instructions, pamphlets, or other written materials from your doctor or pharmacy?: 1 - Never  Interpreter Needed?: No      Activities of Daily Living In your present state of health, do you have any difficulty performing the following activities: 02/21/2020  Hearing? N  Vision? N  Difficulty concentrating or making decisions? N  Walking or climbing stairs? Y  Comment Chronic back pain; last imjection 2 weeks ago  Dressing or bathing? N  Doing errands, shopping? N  Preparing Food and eating ? Y  Comment Daughter assist with meal prep  Using the Toilet? N  In the past six months, have you accidently leaked urine? N  Do you have problems with loss of bowel control? N  Managing your Medications? Y  Comment Daughter assist as needed  Managing your Finances? N  Housekeeping or managing your Housekeeping? Y  Comment Daughter assist  Some recent data might be  hidden    Patient Care Team: Leone Haven, MD as PCP - General (Family Medicine) Rockey Situ Kathlene November, MD as Consulting Physician (Cardiology)  Indicate any recent Medical Services you may have received from other than [facility] providers in the past year (date may be approximate).     Assessment:   This is a routine wellness examination for [place].  I connected with Roye today by telephone and verified that I am speaking with the correct person using two identifiers. Location patient: home Location provider: work Persons participating in the virtual visit:  patient, nurse.    I discussed the limitations, risks, security and privacy concerns of performing an evaluation and management service by telephone and the availability of in person appointments. The patient expressed understanding and verbally consented to this telephonic visit.    Interactive audio and video telecommunications were attempted between this provider and patient, however failed, due to patient having technical difficulties OR patient did not have access to video capability.  We continued and completed visit with audio only.  Some vital signs may be absent or patient reported.   Hearing/Vision screen  Hearing Screening   125Hz 250Hz 500Hz 1000Hz 2000Hz 3000Hz 4000Hz 6000Hz 8000Hz  Right ear:           Left ear:           Comments: Patient is able to hear conversational tones without difficulty.  No issues reported.  Vision Screening Comments: Wears corrective lenses  Cataract extraction, bilateral  Visual acuity not assessed, virtual visit.   Dietary issues and exercise activities discussed: Current Exercise Habits: Home exercise routine Regular diet Good water intake   Goals    . Increase water intake     Stay hydrated      Depression Screen PHQ 2/9 Scores 02/21/2020 07/29/2019 03/25/2019 12/16/2018 10/29/2018 11/17/2016 03/19/2016  PHQ - 2 Score 2 0 0 0 0 2 0  PHQ- 9 Score 4 - - - 0 7 -    Fall Risk Fall  Risk  02/21/2020 07/29/2019 03/25/2019 12/16/2018 12/16/2018  Falls in the past year? 0 0 0 0 0  Comment - - - Emmi Telephone Survey: data to providers prior to load -  Number falls in past yr: 0 0 0 - -  Injury with Fall? - 0 - - -  Follow up Falls evaluation completed Falls evaluation completed Falls evaluation completed - -   Handrails in use when climbing stairs? Yes Home free of loose throw rugs in walkways, pet beds, electrical cords, etc? Yes  Adequate lighting in your home to reduce risk of falls? Yes   ASSISTIVE DEVICES UTILIZED TO PREVENT FALLS: Use of a cane, walker or w/c? No   TIMED UP AND GO: Was the test performed? No . Virtual visit.   Cognitive Function: Patient is alert and oriented x3. Pays bills online.  Enjoys reading.  MMSE - Mini Mental State Exam 11/17/2016  Orientation to time 5  Orientation to Place 5  Registration 3  Attention/ Calculation 5  Recall 3  Language- name 2 objects 2  Language- repeat 1  Language- follow 3 step command 3  Language- read & follow direction 1  Write a sentence 1  Copy design 1  Total score 30     6CIT Screen 02/21/2020 12/16/2018  What Year? 0 points 0 points  What month? 0 points 0 points  What time? 0 points 0 points  Count back from 20 - (No Data)  Months in reverse - (No Data)    Immunizations Immunization History  Administered Date(s) Administered  . Fluad Quad(high Dose 65+) 03/25/2019  . Influenza Split 02/16/2014  . Influenza, High Dose Seasonal PF 05/14/2018  . Influenza-Unspecified 03/03/2012, 03/29/2013  . Moderna SARS-COVID-2 Vaccination 06/30/2019, 07/28/2019  . Pneumococcal Conjugate-13 02/16/2014  . Pneumococcal Polysaccharide-23 06/03/2008  . Zoster 06/03/2010   Health Maintenance Health Maintenance  Topic Date Due  . INFLUENZA VACCINE  12/18/2019  . COVID-19 Vaccine  Completed  . PNA vac Low Risk Adult  Completed  . TETANUS/TDAP  Discontinued  Dental Screening: Recommended annual dental  exams for proper oral hygiene.  Community Resource Referral / Chronic Care Management: CRR required this visit?  No   CCM required this visit?  No      Plan:   Keep all routine maintenance appointments.   Follow up 02/22/20 at 11:00  Conditions/risks identified: Notes anxiety/depression medication doesn't seem to be working as well anymore.  Panic attacks increasing and losing patience more often. Scheduled follow up 02/22/20.   I have personally reviewed and noted the following in the patient's chart:   . Medical and social history . Use of alcohol, tobacco or illicit drugs  . Current medications and supplements . Functional ability and status . Nutritional status . Physical activity . Advanced directives . List of other physicians . Hospitalizations, surgeries, and ER visits in previous 12 months . Vitals . Screenings to include cognitive, depression, and falls . Referrals and appointments  In addition, I have reviewed and discussed with patient certain preventive protocols, quality metrics, and best practice recommendations. A written personalized care plan for preventive services as well as general preventive health recommendations were provided to patient via mychart.     Varney Biles, LPN   [id number]/12/[id number]

## 2020-02-21 NOTE — Patient Instructions (Addendum)
Allen David , Thank you for taking time to come for your Medicare Wellness Visit. I appreciate your ongoing commitment to your health goals. Please review the following plan we discussed and let me know if I can assist you in the future.   These are the goals we discussed: Goals    . Increase water intake     Stay hydrated       This is a list of the screening recommended for you and due dates:  Health Maintenance  Topic Date Due  . Flu Shot  12/18/2019  . COVID-19 Vaccine  Completed  . Pneumonia vaccines  Completed  . Tetanus Vaccine  Discontinued    Immunizations Immunization History  Administered Date(s) Administered  . Fluad Quad(high Dose 65+) 03/25/2019  . Influenza Split 02/16/2014  . Influenza, High Dose Seasonal PF 05/14/2018  . Influenza-Unspecified 03/03/2012, 03/29/2013  . Moderna SARS-COVID-2 Vaccination 06/30/2019, 07/28/2019  . Pneumococcal Conjugate-13 02/16/2014  . Pneumococcal Polysaccharide-23 06/03/2008  . Zoster 06/03/2010   Keep all routine maintenance appointments.   Follow up 02/22/20 at 11:00  Advanced directives: End of life planning; Advance aging; Advanced directives discussed.  Copy of current HCPOA/Living Will requested.    Follow up in one year for your annual wellness visit.   Preventive Care 24 Years and Older, Male Preventive care refers to lifestyle choices and visits with your health care provider that can promote health and wellness. What does preventive care include?  A yearly physical exam. This is also called an annual well check.  Dental exams once or twice a year.  Routine eye exams. Ask your health care provider how often you should have your eyes checked.  Personal lifestyle choices, including:  Daily care of your teeth and gums.  Regular physical activity.  Eating a healthy diet.  Avoiding tobacco and drug use.  Limiting alcohol use.  Practicing safe sex.  Taking low doses of aspirin every day.  Taking  vitamin and mineral supplements as recommended by your health care provider. What happens during an annual well check? The services and screenings done by your health care provider during your annual well check will depend on your age, overall health, lifestyle risk factors, and family history of disease. Counseling  Your health care provider may ask you questions about your:  Alcohol use.  Tobacco use.  Drug use.  Emotional well-being.  Home and relationship well-being.  Sexual activity.  Eating habits.  History of falls.  Memory and ability to understand (cognition).  Work and work Statistician. Screening  You may have the following tests or measurements:  Height, weight, and BMI.  Blood pressure.  Lipid and cholesterol levels. These may be checked every 5 years, or more frequently if you are over 68 years old.  Skin check.  Lung cancer screening. You may have this screening every year starting at age 37 if you have a 30-pack-year history of smoking and currently smoke or have quit within the past 15 years.  Fecal occult blood test (FOBT) of the stool. You may have this test every year starting at age 72.  Flexible sigmoidoscopy or colonoscopy. You may have a sigmoidoscopy every 5 years or a colonoscopy every 10 years starting at age 37.  Prostate cancer screening. Recommendations will vary depending on your family history and other risks.  Hepatitis C blood test.  Hepatitis B blood test.  Sexually transmitted disease (STD) testing.  Diabetes screening. This is done by checking your blood sugar (glucose) after you have  not eaten for a while (fasting). You may have this done every 1-3 years.  Abdominal aortic aneurysm (AAA) screening. You may need this if you are a current or former smoker.  Osteoporosis. You may be screened starting at age 10 if you are at high risk. Talk with your health care provider about your test results, treatment options, and if  necessary, the need for more tests. Vaccines  Your health care provider may recommend certain vaccines, such as:  Influenza vaccine. This is recommended every year.  Tetanus, diphtheria, and acellular pertussis (Tdap, Td) vaccine. You may need a Td booster every 10 years.  Zoster vaccine. You may need this after age 77.  Pneumococcal 13-valent conjugate (PCV13) vaccine. One dose is recommended after age 76.  Pneumococcal polysaccharide (PPSV23) vaccine. One dose is recommended after age 49. Talk to your health care provider about which screenings and vaccines you need and how often you need them. This information is not intended to replace advice given to you by your health care provider. Make sure you discuss any questions you have with your health care provider. Document Released: 06/01/2015 Document Revised: 01/23/2016 Document Reviewed: 03/06/2015 Elsevier Interactive Patient Education  2017 [employer] Prevention in the Home Falls can cause injuries. They can happen to people of all ages. There are many things you can do to make your home safe and to help prevent falls. What can I do on the outside of my home?  Regularly fix the edges of walkways and driveways and fix any cracks.  Remove anything that might make you trip as you walk through a door, such as a raised step or threshold.  Trim any bushes or trees on the path to your home.  Use bright outdoor lighting.  Clear any walking paths of anything that might make someone trip, such as rocks or tools.  Regularly check to see if handrails are loose or broken. Make sure that both sides of any steps have handrails.  Any raised decks and porches should have guardrails on the edges.  Have any leaves, snow, or ice cleared regularly.  Use sand or salt on walking paths during winter.  Clean up any spills in your garage right away. This includes oil or grease spills. What can I do in the bathroom?  Use night  lights.  Install grab bars by the toilet and in the tub and shower. Do not use towel bars as grab bars.  Use non-skid mats or decals in the tub or shower.  If you need to sit down in the shower, use a plastic, non-slip stool.  Keep the floor dry. Clean up any water that spills on the floor as soon as it happens.  Remove soap buildup in the tub or shower regularly.  Attach bath mats securely with double-sided non-slip rug tape.  Do not have throw rugs and other things on the floor that can make you trip. What can I do in the bedroom?  Use night lights.  Make sure that you have a light by your bed that is easy to reach.  Do not use any sheets or blankets that are too big for your bed. They should not hang down onto the floor.  Have a firm chair that has side arms. You can use this for support while you get dressed.  Do not have throw rugs and other things on the floor that can make you trip. What can I do in the kitchen?  Clean up any  spills right away.  Avoid walking on wet floors.  Keep items that you use a lot in easy-to-reach places.  If you need to reach something above you, use a strong step stool that has a grab bar.  Keep electrical cords out of the way.  Do not use floor polish or wax that makes floors slippery. If you must use wax, use non-skid floor wax.  Do not have throw rugs and other things on the floor that can make you trip. What can I do with my stairs?  Do not leave any items on the stairs.  Make sure that there are handrails on both sides of the stairs and use them. Fix handrails that are broken or loose. Make sure that handrails are as long as the stairways.  Check any carpeting to make sure that it is firmly attached to the stairs. Fix any carpet that is loose or worn.  Avoid having throw rugs at the top or bottom of the stairs. If you do have throw rugs, attach them to the floor with carpet tape.  Make sure that you have a light switch at the  top of the stairs and the bottom of the stairs. If you do not have them, ask someone to add them for you. What else can I do to help prevent falls?  Wear shoes that:  Do not have high heels.  Have rubber bottoms.  Are comfortable and fit you well.  Are closed at the toe. Do not wear sandals.  If you use a stepladder:  Make sure that it is fully opened. Do not climb a closed stepladder.  Make sure that both sides of the stepladder are locked into place.  Ask someone to hold it for you, if possible.  Clearly mark and make sure that you can see:  Any grab bars or handrails.  First and last steps.  Where the edge of each step is.  Use tools that help you move around (mobility aids) if they are needed. These include:  Canes.  Walkers.  Scooters.  Crutches.  Turn on the lights when you go into a dark area. Replace any light bulbs as soon as they burn out.  Set up your furniture so you have a clear path. Avoid moving your furniture around.  If any of your floors are uneven, fix them.  If there are any pets around you, be aware of where they are.  Review your medicines with your doctor. Some medicines can make you feel dizzy. This can increase your chance of falling. Ask your doctor what other things that you can do to help prevent falls. This information is not intended to replace advice given to you by your health care provider. Make sure you discuss any questions you have with your health care provider. Document Released: 03/01/2009 Document Revised: 10/11/2015 Document Reviewed: 06/09/2014 Elsevier Interactive Patient Education  2017 [employer].

## 2020-02-22 ENCOUNTER — Other Ambulatory Visit: Payer: Self-pay

## 2020-02-22 ENCOUNTER — Encounter: Payer: Self-pay | Admitting: Family Medicine

## 2020-02-22 ENCOUNTER — Ambulatory Visit (INDEPENDENT_AMBULATORY_CARE_PROVIDER_SITE_OTHER): Payer: Medicare Other | Admitting: Family Medicine

## 2020-02-22 VITALS — BP 120/80 | HR 51 | Temp 97.7°F | Ht 72.0 in | Wt 173.8 lb

## 2020-02-22 DIAGNOSIS — Z23 Encounter for immunization: Secondary | ICD-10-CM | POA: Diagnosis not present

## 2020-02-22 DIAGNOSIS — F32A Depression, unspecified: Secondary | ICD-10-CM

## 2020-02-22 DIAGNOSIS — F419 Anxiety disorder, unspecified: Secondary | ICD-10-CM | POA: Diagnosis not present

## 2020-02-22 MED ORDER — FLUOXETINE HCL 20 MG PO TABS: 20.0000 mg | ORAL_TABLET | Freq: Every day | ORAL | 1 refills | Status: DC

## 2020-02-22 MED ORDER — DULOXETINE HCL 30 MG PO CPEP: 30.0000 mg | ORAL_CAPSULE | Freq: Every day | ORAL | 0 refills | Status: DC

## 2020-02-22 NOTE — Patient Instructions (Signed)
Nice to see you. Ready to taper you down on the Cymbalta.  You need to take Cymbalta 30 mg once daily for 7 days.  At the same time you will start on Prozac 20 mg once daily.  At the end of 7 days you can discontinue the Cymbalta and then continue on the Prozac.

## 2020-02-22 NOTE — Progress Notes (Signed)
  [name], MD Phone: [phone]  Allen David is a 86 y.o. male who presents today for f/u.  Anxiety/depression: Patient feels as though his anxiety is still bothersome.  Minimal depression at times though nothing significant.  No SI.  Notes occasional panic attacks.  Notes a nervous stomach.  He will occasionally have to take a half dose of his Xanax to help calm himself down with a panic attack.  He notes no obvious cause for these.  He feels as though the Cymbalta and BuSpar not adequately treating his symptoms.  He notes in the past Prozac was the most beneficial.  Social History   Tobacco Use  Smoking Status Former Smoker  . Quit date: 05/19/1966  . Years since quitting: 53.8  Smokeless Tobacco Former Systems developer  Tobacco Comment   Quit in [place] see history of present illness  Objective  Physical Exam Vitals:   02/22/20 1119  BP: 120/80  Pulse: (!) 51  Temp: 97.7 F (36.5 C)  SpO2: 99%    BP Readings from Last 3 Encounters:  02/22/20 120/80  11/16/19 130/70  10/16/19 129/78   Wt Readings from Last 3 Encounters:  02/22/20 173 lb 12.8 oz (78.8 kg)  02/21/20 177 lb (80.3 kg)  11/16/19 177 lb 9.6 oz (80.6 kg)    Physical Exam Constitutional:      General: He is not in acute distress.    Appearance: He is not diaphoretic.  Cardiovascular:     Rate and Rhythm: Normal rate and regular rhythm.     Heart sounds: Normal heart sounds.  Pulmonary:     Effort: Pulmonary effort is normal.  Skin:    General: Skin is warm and dry.  Neurological:     Mental Status: He is alert.      Assessment/Plan: Please see individual problem list.  Problem List Items Addressed This Visit    Anxiety and depression    Patient continues to have issues with this.  We will taper him down on the Cymbalta 30 mg for 7 days and start on Prozac 20 mg once daily at the same time.  At the end of 7 days he can discontinue the Cymbalta and continue on Prozac.  He will follow up  with me in 2 months.  He will continue BuSpar 5 mg twice daily.      Relevant Medications   DULoxetine (CYMBALTA) 30 MG capsule   FLUoxetine (PROZAC) 20 MG tablet    Other Visit Diagnoses    Need for immunization against influenza    -  Primary   Relevant Orders   Flu Vaccine QUAD High Dose(Fluad) (Completed)      This visit occurred during the SARS-CoV-2 public health emergency.  Safety protocols were in place, including screening questions prior to the visit, additional usage of staff PPE, and extensive cleaning of exam room while observing appropriate contact time as indicated for disinfecting solutions.    [name], MD [place]

## 2020-02-22 NOTE — Assessment & Plan Note (Signed)
Patient continues to have issues with this.  We will taper him down on the Cymbalta 30 mg for 7 days and start on Prozac 20 mg once daily at the same time.  At the end of 7 days he can discontinue the Cymbalta and continue on Prozac.  He will follow up with me in 2 months.  He will continue BuSpar 5 mg twice daily.

## 2020-03-06 DIAGNOSIS — M5136 Other intervertebral disc degeneration, lumbar region: Secondary | ICD-10-CM | POA: Diagnosis not present

## 2020-03-06 DIAGNOSIS — M5416 Radiculopathy, lumbar region: Secondary | ICD-10-CM | POA: Diagnosis not present

## 2020-03-06 DIAGNOSIS — M48062 Spinal stenosis, lumbar region with neurogenic claudication: Secondary | ICD-10-CM | POA: Diagnosis not present

## 2020-04-16 ENCOUNTER — Telehealth: Payer: Self-pay

## 2020-04-16 DIAGNOSIS — M5416 Radiculopathy, lumbar region: Secondary | ICD-10-CM | POA: Diagnosis not present

## 2020-04-16 DIAGNOSIS — M5136 Other intervertebral disc degeneration, lumbar region: Secondary | ICD-10-CM | POA: Diagnosis not present

## 2020-04-16 NOTE — Telephone Encounter (Signed)
Pt's daughter wanted Dr. Caryl Bis to know that patient has been feeling sick on his stomach, his feet are staying cold all the time, and she wants to address the thinning of the lining in patient's stomach at his appointment unless Dr. Caryl Bis wanted to see him sooner. I let her know that he is out of the office until Thurs. but I will send a message to him. Pt has an appointment 04/23/20 @1 :15pm.

## 2020-04-16 NOTE — Telephone Encounter (Signed)
Attempted to call and speak to Patient or his daughters. Call did not go to voicemail and I could not leave a message.

## 2020-04-16 NOTE — Telephone Encounter (Signed)
Noted. Please follow-up with them later today or tomorrow to get more details. Thanks.

## 2020-04-23 ENCOUNTER — Ambulatory Visit (INDEPENDENT_AMBULATORY_CARE_PROVIDER_SITE_OTHER): Payer: Medicare Other | Admitting: Family Medicine

## 2020-04-23 ENCOUNTER — Other Ambulatory Visit: Payer: Self-pay

## 2020-04-23 ENCOUNTER — Encounter: Payer: Self-pay | Admitting: Family Medicine

## 2020-04-23 VITALS — BP 134/82 | HR 83 | Temp 97.8°F | Ht 72.0 in | Wt 162.6 lb

## 2020-04-23 DIAGNOSIS — E538 Deficiency of other specified B group vitamins: Secondary | ICD-10-CM

## 2020-04-23 DIAGNOSIS — F419 Anxiety disorder, unspecified: Secondary | ICD-10-CM | POA: Diagnosis not present

## 2020-04-23 DIAGNOSIS — R5383 Other fatigue: Secondary | ICD-10-CM | POA: Diagnosis not present

## 2020-04-23 DIAGNOSIS — F32A Depression, unspecified: Secondary | ICD-10-CM

## 2020-04-23 DIAGNOSIS — R7989 Other specified abnormal findings of blood chemistry: Secondary | ICD-10-CM | POA: Diagnosis not present

## 2020-04-23 DIAGNOSIS — R110 Nausea: Secondary | ICD-10-CM | POA: Diagnosis not present

## 2020-04-23 LAB — IBC + FERRITIN
Ferritin: 369.6 ng/mL — ABNORMAL HIGH (ref 22.0–322.0)
Iron: 93 ug/dL (ref 42–165)
Saturation Ratios: 35.3 % (ref 20.0–50.0)
Transferrin: 188 mg/dL — ABNORMAL LOW (ref 212.0–360.0)

## 2020-04-23 LAB — CBC
HCT: 36.9 % — ABNORMAL LOW (ref 39.0–52.0)
Hemoglobin: 12.2 g/dL — ABNORMAL LOW (ref 13.0–17.0)
MCHC: 33.1 g/dL (ref 30.0–36.0)
MCV: 90.3 fl (ref 78.0–100.0)
Platelets: 134 10*3/uL — ABNORMAL LOW (ref 150.0–400.0)
RBC: 4.08 Mil/uL — ABNORMAL LOW (ref 4.22–5.81)
RDW: 16.8 % — ABNORMAL HIGH (ref 11.5–15.5)
WBC: 8.1 10*3/uL (ref 4.0–10.5)

## 2020-04-23 LAB — TSH: TSH: 2.59 u[IU]/mL (ref 0.35–4.50)

## 2020-04-23 LAB — VITAMIN B12: Vitamin B-12: 287 pg/mL (ref 211–911)

## 2020-04-23 NOTE — Patient Instructions (Signed)
Nice to see you. Please get in with a psychiatrist through the [place].  We will check labs.  Please continue with the prozac 40 mg daily. Please do not increase this medication without the approval of a physician.  If the anxiety and depression are not improving over the next 2-3 weeks please let me know.

## 2020-04-23 NOTE — Telephone Encounter (Signed)
Patient was seen today in office at 1:15 pm; closing note.

## 2020-04-23 NOTE — Progress Notes (Signed)
[name], MD Phone: [phone]  Allen David is a 86 y.o. male who presents today for f/u.  Anxiety/depression: Patient's notes his depression is stable.  Anxiety is always an issue.  His [facility] decreased his Xanax dose to half a milligram once daily which he was already doing.  He continues on BuSpar.  Cymbalta did not help so he is back on Prozac.  2 weeks ago he increased the dose to 40 mg on his own.  No SI.  He notes his [place] discussed having him see a psychiatrist.   Nausea: Patient notes occasional nausea.  He will take Pepto-Bismol with good benefit.  He gets a cold sensation with this.  No vomiting, diarrhea, blood in stool, or abdominal pain.  Notes he has had similar symptoms in the past with IBS.  B12 deficiency: Patient has been low in the past.  Has been taking a B12 supplement.  He also started on iron recently.  Social History   Tobacco Use  Smoking Status Former Smoker  . Quit date: 05/19/1966  . Years since quitting: 53.9  Smokeless Tobacco Former Systems developer  Tobacco Comment   Quit in [place] see history of present illness  Objective  Physical Exam Vitals:   04/23/20 1322  BP: 134/82  Pulse: 83  Temp: 97.8 F (36.6 C)  SpO2: 97%    BP Readings from Last 3 Encounters:  04/24/20 103/75  04/23/20 134/82  02/22/20 120/80   Wt Readings from Last 3 Encounters:  04/24/20 162 lb 9.6 oz (73.8 kg)  04/23/20 162 lb 9.6 oz (73.8 kg)  02/22/20 173 lb 12.8 oz (78.8 kg)    Physical Exam Constitutional:      General: He is not in acute distress.    Appearance: He is not diaphoretic.  Cardiovascular:     Rate and Rhythm: Normal rate and regular rhythm.     Heart sounds: Normal heart sounds.  Pulmonary:     Effort: Pulmonary effort is normal.     Breath sounds: Normal breath sounds.  Musculoskeletal:        General: No edema.  Skin:    General: Skin is warm and dry.  Neurological:     Mental Status: He is alert.       Assessment/Plan: Please see individual problem list.  Problem List Items Addressed This Visit    Anxiety and depression    The patient continues to have issues with this.  I did discuss that he may get additional benefit from the Prozac since he just increase the dose.  He can continue Prozac 40 mg once daily and buspirone 5 mg twice daily.  I discussed referring him to a psychiatrist so he prefers to have that done through his [place].      B12 deficiency    Check B12.      Relevant Orders   B12 (Completed)   Nausea    Undetermined cause.  May be related to his iron supplement that he just started.  We will check iron levels to see if he requires this currently.       Other Visit Diagnoses    Fatigue, unspecified type    -  Primary   Relevant Orders   CBC (Completed)   TSH (Completed)   Elevated ferritin       Relevant Orders   IBC + Ferritin (Completed)      This visit occurred during the SARS-CoV-2 public health emergency.  Safety protocols were in place, including screening questions prior to the visit, additional usage of staff PPE, and extensive cleaning of exam room while observing appropriate contact time as indicated for disinfecting solutions.   I have spent 35 minutes in the care of this patient regarding history taking, documentation, discussion of plan, placing orders.   [name], MD [place]

## 2020-04-24 ENCOUNTER — Telehealth: Payer: Self-pay | Admitting: Family Medicine

## 2020-04-24 ENCOUNTER — Other Ambulatory Visit: Payer: Self-pay

## 2020-04-24 ENCOUNTER — Emergency Department
Admission: EM | Admit: 2020-04-24 | Discharge: 2020-04-24 | Disposition: A | Discharge status: Home / Self Care | Payer: No Typology Code available for payment source | Attending: Emergency Medicine | Admitting: Emergency Medicine

## 2020-04-24 ENCOUNTER — Emergency Department: Payer: No Typology Code available for payment source

## 2020-04-24 ENCOUNTER — Encounter: Payer: Self-pay | Admitting: Intensive Care

## 2020-04-24 DIAGNOSIS — I10 Essential (primary) hypertension: Secondary | ICD-10-CM | POA: Diagnosis not present

## 2020-04-24 DIAGNOSIS — Z79899 Other long term (current) drug therapy: Secondary | ICD-10-CM | POA: Diagnosis not present

## 2020-04-24 DIAGNOSIS — Z87891 Personal history of nicotine dependence: Secondary | ICD-10-CM | POA: Diagnosis not present

## 2020-04-24 DIAGNOSIS — I119 Hypertensive heart disease without heart failure: Secondary | ICD-10-CM | POA: Insufficient documentation

## 2020-04-24 DIAGNOSIS — I2510 Atherosclerotic heart disease of native coronary artery without angina pectoris: Secondary | ICD-10-CM | POA: Diagnosis not present

## 2020-04-24 DIAGNOSIS — K921 Melena: Secondary | ICD-10-CM | POA: Insufficient documentation

## 2020-04-24 LAB — CBC
HCT: 35.4 % — ABNORMAL LOW (ref 39.0–52.0)
Hemoglobin: 11.9 g/dL — ABNORMAL LOW (ref 13.0–17.0)
MCH: 30.3 pg (ref 26.0–34.0)
MCHC: 33.6 g/dL (ref 30.0–36.0)
MCV: 90.1 fL (ref 80.0–100.0)
Platelets: 148 10*3/uL — ABNORMAL LOW (ref 150–400)
RBC: 3.93 MIL/uL — ABNORMAL LOW (ref 4.22–5.81)
RDW: 16 % — ABNORMAL HIGH (ref 11.5–15.5)
WBC: 9.2 10*3/uL (ref 4.0–10.5)
nRBC: 0 % (ref 0.0–0.2)

## 2020-04-24 LAB — COMPREHENSIVE METABOLIC PANEL
ALT: 43 U/L (ref 0–44)
AST: 30 U/L (ref 15–41)
Albumin: 4.1 g/dL (ref 3.5–5.0)
Alkaline Phosphatase: 48 U/L (ref 38–126)
Anion gap: 11 (ref 5–15)
BUN: 31 mg/dL — ABNORMAL HIGH (ref 8–23)
CO2: 24 mmol/L (ref 22–32)
Calcium: 9.4 mg/dL (ref 8.9–10.3)
Chloride: 101 mmol/L (ref 98–111)
Creatinine, Ser: 1.03 mg/dL (ref 0.61–1.24)
GFR, Estimated: >60 mL/min (ref >60)
Glucose, Bld: 126 mg/dL — ABNORMAL HIGH (ref 70–99)
Potassium: 3.7 mmol/L (ref 3.5–5.1)
Sodium: 136 mmol/L (ref 135–145)
Total Bilirubin: 0.8 mg/dL (ref 0.3–1.2)
Total Protein: 7 g/dL (ref 6.5–8.1)

## 2020-04-24 LAB — TROPONIN I (HIGH SENSITIVITY): Troponin I (High Sensitivity): 9 ng/L (ref <18)

## 2020-04-24 MED ORDER — PANTOPRAZOLE SODIUM 40 MG PO TBEC: 40.0000 mg | DELAYED_RELEASE_TABLET | Freq: Every day | ORAL | 2 refills | Status: DC

## 2020-04-24 MED ORDER — PANTOPRAZOLE SODIUM 40 MG PO TBEC
40.0000 mg | DELAYED_RELEASE_TABLET | Freq: Once | ORAL | Status: AC
  Administered 2020-04-24: 40 mg via ORAL
  Filled 2020-04-24: qty 1

## 2020-04-24 NOTE — Telephone Encounter (Signed)
Pt was seen yesterday by Dr.Sonnenberg.. Today he is having black stool no other symptoms.  Braidan Ricciardi,cma

## 2020-04-24 NOTE — Telephone Encounter (Signed)
Pt was seen yesterday by Dr.Sonnenberg.. Today he is having black stool no other sypmtoms  Please call [phone]

## 2020-04-24 NOTE — ED Triage Notes (Signed)
Patient reports one episode of black stool today. Denies pain. Reports he take blood thinners daily

## 2020-04-24 NOTE — ED Provider Notes (Signed)
[facility] Emergency Department Provider Note   ____________________________________________   First MD Initiated Contact with Patient 04/24/20 1820     (approximate)  I have reviewed the triage vital signs and the nursing notes.   HISTORY  Chief Complaint Melena    HPI Allen David is a 86 y.o. male reports one episode of black stool today.  He has been taking Pepto-Bismol because his stomach felt a little upset.  Pepto-Bismol made his stomach feel better.  Family reports he has not really been himself for the last week or so.  He has been getting steroid shots I believe in his back for back pain.  Patient's not had a fever cough or any other complaints.         Past Medical History:  Diagnosis Date  . Anxiety   . Back injury    with chronic pain  . CAD in native artery   . Chronic low back pain   . Depression   . ED (erectile dysfunction)   . Elevated PSA   . HLD (hyperlipidemia)   . HTN (hypertension)   . IBS (irritable bowel syndrome)   . Malaise and fatigue   . Pernicious anemia     Patient Active Problem List   Diagnosis Date Noted  . Chronic left shoulder pain 11/16/2019  . AKI (acute kidney injury) ([place]) 11/16/2019  . IBS (irritable bowel syndrome) 07/31/2019  . Right leg pain 03/25/2019  . Branch retinal vein occlusion 12/13/2018  . Muscle cramps 11/03/2018  . Difficulty hearing 09/20/2018  . Leg pain 09/20/2018  . Proteinuria 05/30/2018  . Carpal tunnel syndrome 05/30/2018  . Fatty liver 05/14/2018  . Anxiety 05/14/2018  . Aortic atherosclerosis ([place]) 01/05/2018  . Neuropathy 12/26/2017  . Allergic rhinitis 09/16/2017  . Muscular chest pain 08/21/2017  . Adrenal adenoma 03/11/2017  . Incidental lung nodule, > 51m and < 843m10/24/2018  . Right foot pain 11/04/2016  . Other specified abdominal hernia without obstruction or gangrene 11/04/2016  . Skin lesion 01/01/2015  . Arthralgia of both knees 09/04/2014  .  Pernicious anemia 12/20/2013  . Hypertension 06/03/2012  . Chronic low back pain 06/03/2012  . Anxiety and depression 06/03/2012  . Hyperlipidemia 01/20/2012  . CAD, NATIVE VESSEL 01/09/2010    Past Surgical History:  Procedure Laterality Date  . CARPAL TUNNEL RELEASE    . EYE SURGERY    . VASECTOMY      Prior to Admission medications   Medication Sig Start Date End Date Taking? Authorizing Provider  ALPRAZolam (XDuanne Moron1 MG tablet Take 2 mg by mouth 4 (four) times daily as needed for anxiety or sleep. Patient is taking 1/2 tablet    PeVerline LemaMD  amLODipine (NORVASC) 5 MG tablet Take 5 mg by mouth daily.    [provider]  atorvastatin (LIPITOR) 20 MG tablet Take 1 tablet (20 mg total) by mouth daily. 01/05/18   GoMinna MerrittsMD  baclofen (LIORESAL) 10 MG tablet Take 1 tablet (10 mg total) by mouth 3 (three) times daily as needed for muscle spasms. 12/31/18   SoLeone HavenMD  busPIRone (BUSPAR) 5 MG tablet Take 1 tablet (5 mg total) by mouth 2 (two) times daily. 11/18/19   SoLeone HavenMD  FLUoxetine (PROZAC) 20 MG tablet Take 1 tablet (20 mg total) by mouth daily. Patient taking differently: Take 40 mg by mouth daily.  02/22/20   SoLeone HavenMD  levothyroxine (SYNTHROID,  LEVOTHROID) 25 MCG tablet Take 1 tablet (25 mcg total) by mouth daily before breakfast. 05/14/18   McLean-Scocuzza, Nino Glow, MD    Allergies Lexapro [escitalopram oxalate] and Tetanus toxoids  Family History  Problem Relation Age of Onset  . Stroke Mother   . Hypertension Mother   . Ulcers Mother   . Cancer Father        multiple myeloma  . Diabetes Sister   . Diabetes Brother   . Diabetes Brother     Social History Social History   Tobacco Use  . Smoking status: Former Smoker    Quit date: 05/19/1966    Years since quitting: 53.9  . Smokeless tobacco: Former Systems developer  . Tobacco comment: Quit in 1968  Vaping Use  . Vaping Use: Never used  Substance Use Topics  .  Alcohol use: Yes    Alcohol/week: 0.0 - 1.0 standard drinks    Comment: daily  . Drug use: No    Review of Systems  Constitutional: No fever/chills Eyes: No visual changes. ENT: No sore throat. Cardiovascular: Denies chest pain. Respiratory: Denies shortness of breath. Gastrointestinal: No abdominal pain.  No nausea, no vomiting.  No diarrhea.  No constipation.  Epigastric area felt a little upset however. Genitourinary: Negative for dysuria. Musculoskeletal: Chronic back pain. Skin: Negative for rash. Neurological: Negative for headaches, focal weakness   ____________________________________________   PHYSICAL EXAM:  VITAL SIGNS: ED Triage Vitals  Enc Vitals Group     BP 04/24/20 1528 137/75     Pulse Rate 04/24/20 1528 (!) 101     Resp 04/24/20 1528 16     Temp 04/24/20 1528 98.2 F (36.8 C)     Temp Source 04/24/20 1528 Oral     SpO2 04/24/20 1528 100 %     Weight 04/24/20 1529 162 lb 9.6 oz (73.8 kg)     Height 04/24/20 1529 6' (1.829 m)     Head Circumference --      Peak Flow --      Pain Score 04/24/20 1529 0     Pain Loc --      Pain Edu? --      Excl. in [place]? --     Constitutional: Alert and oriented. Well appearing and in no acute distress. Eyes: Conjunctivae are normal. PER Head: Atraumatic. Nose: No congestion/rhinnorhea. Mouth/Throat: Mucous membranes are moist.  Oropharynx non-erythematous. Neck: No stridor Cardiovascular: Normal rate, regular rhythm. Grossly normal heart sounds.  Good peripheral circulation. Respiratory: Normal respiratory effort.  No retractions. Lungs CTAB. Gastrointestinal: Soft and nontender. No distention. No abdominal bruits. . Musculoskeletal: No lower extremity tenderness nor edema.   Neurologic:  Normal speech and language. No gross focal neurologic deficits are appreciated.  Skin:  Skin is warm, dry and intact. No rash noted. Rectal: Black stool Hemoccult negative no masses palpated likely this is from his  Pepto-Bismol. ____________________________________________   LABS (all labs ordered are listed, but only abnormal results are displayed)  Labs Reviewed  COMPREHENSIVE METABOLIC PANEL - Abnormal; Notable for the following components:      Result Value   Glucose, Bld 126 (*)    BUN 31 (*)    All other components within normal limits  CBC - Abnormal; Notable for the following components:   RBC 3.93 (*)    Hemoglobin 11.9 (*)    HCT 35.4 (*)    RDW 16.0 (*)    Platelets 148 (*)    All other components within normal limits  POC  OCCULT BLOOD, ED  TROPONIN I (HIGH SENSITIVITY)   ____________________________________________  EKG EKG read interpreted by me shows normal sinus rhythm rate of 62 left axis no acute ST-T wave changes  ____________________________________________  RADIOLOGY Gertha Calkin, personally viewed and evaluated these images (plain radiographs) as part of my medical decision making, as well as reviewing the written report by the radiologist.  ED MD interpretation:    Official radiology report(s): DG Chest Portable 1 View  Result Date: 04/24/2020 CLINICAL DATA:  Epigastric discomfort, history hypertension EXAM: PORTABLE CHEST 1 VIEW COMPARISON:  Portable exam 1839 hours without priors for comparison FINDINGS: Normal heart size, mediastinal contours, and pulmonary vascularity. Atherosclerotic calcification aorta. Lungs clear. Skin folds project over the chest bilaterally. No infiltrate, pleural effusion, or pneumothorax. Osseous structures unremarkable. IMPRESSION: No acute abnormalities. Aortic Atherosclerosis (ICD10-I70.0). Electronically Signed   By: Lavonia Dana M.D.   On: 04/24/2020 19:14    ____________________________________________   PROCEDURES  Procedure(s) performed (including Critical Care):  Procedures   ____________________________________________   INITIAL IMPRESSION / ASSESSMENT AND PLAN / ED COURSE   Patient's black stools appear to  be due to his Pepto-Bismol intake.  Not sure why he has the feeling of upset stomach.  I will give him some Protonix for about a week or so just in case and have him follow-up with his regular doctor.  I will have him return here if he gets any worse.  Especially for fever vomiting shortness of breath or worse pain.  Currently his lab work and EKG chest x-ray all look okay.  He has no symptoms that really suggest anything much to me and his upset stomach feels better with Pepto-Bismol.          ____________________________________________   FINAL CLINICAL IMPRESSION(S) / ED DIAGNOSES  Final diagnoses:  Black stools     ED Discharge Orders    None      *Please note:  Allen David was evaluated in Emergency Department on 04/24/2020 for the symptoms described in the history of present illness. He was evaluated in the context of the global COVID-19 pandemic, which necessitated consideration that the patient might be at risk for infection with the SARS-CoV-2 virus that causes COVID-19. Institutional protocols and algorithms that pertain to the evaluation of patients at risk for COVID-19 are in a state of rapid change based on information released by regulatory bodies including the CDC and federal and state organizations. These policies and algorithms were followed during the patient's care in the ED.  Some ED evaluations and interventions may be delayed as a result of limited staffing during and the pandemic.*   Note:  This document was prepared using Dragon voice recognition software and may include unintentional dictation errors.    Nena Polio, MD 04/24/20 1925

## 2020-04-24 NOTE — ED Notes (Signed)
Took over care of pt. Pt resting comfortably and has no complaints at this time. Pt in NAD at this time. VSS. Awaiting further orders. Will continue to monitor.

## 2020-04-24 NOTE — Telephone Encounter (Signed)
Did he start on the iron supplement yesterday? Is the stool like tar in consistency or is normally formed?

## 2020-04-24 NOTE — Discharge Instructions (Addendum)
Your black stools do not have any blood in them on the chemical test that we did.  They appear to be due to the Pepto-Bismol.  Please return for any worse discomfort, any fever or shortness of breath or any other problems.  Since you are having the upset stomach I will give you a few days of Protonix to see if that helps with anything.  Please follow-up with your regular doctor within the week.

## 2020-04-24 NOTE — Telephone Encounter (Signed)
Patient did not start the iron supplement. His stool is normally formed. Son is very worried. Patients son was instructed t take patient to UC to be evaluated since no availed appointment here.

## 2020-04-26 DIAGNOSIS — E538 Deficiency of other specified B group vitamins: Secondary | ICD-10-CM | POA: Insufficient documentation

## 2020-04-26 DIAGNOSIS — R110 Nausea: Secondary | ICD-10-CM | POA: Insufficient documentation

## 2020-04-26 DIAGNOSIS — R112 Nausea with vomiting, unspecified: Secondary | ICD-10-CM | POA: Insufficient documentation

## 2020-04-26 NOTE — Assessment & Plan Note (Signed)
Undetermined cause.  May be related to his iron supplement that he just started.  We will check iron levels to see if he requires this currently.

## 2020-04-26 NOTE — Assessment & Plan Note (Signed)
The patient continues to have issues with this.  I did discuss that he may get additional benefit from the Prozac since he just increase the dose.  He can continue Prozac 40 mg once daily and buspirone 5 mg twice daily.  I discussed referring him to a psychiatrist so he prefers to have that done through his [place].

## 2020-04-26 NOTE — Assessment & Plan Note (Signed)
Check B12 

## 2020-05-16 ENCOUNTER — Telehealth: Payer: Self-pay

## 2020-05-16 NOTE — Telephone Encounter (Signed)
They will need to come in for teaching to be able to do this at home. Can you get him set up with a nurse visit to have this done?

## 2020-05-21 ENCOUNTER — Other Ambulatory Visit: Payer: Self-pay

## 2020-05-21 ENCOUNTER — Ambulatory Visit: Payer: No Typology Code available for payment source

## 2020-05-21 ENCOUNTER — Ambulatory Visit: Payer: Medicare Other

## 2020-05-21 DIAGNOSIS — E538 Deficiency of other specified B group vitamins: Secondary | ICD-10-CM

## 2020-05-21 MED ORDER — CYANOCOBALAMIN 1000 MCG/ML IJ SOLN: 1000.0000 ug | Freq: Once | INTRAMUSCULAR | Status: DC

## 2020-05-21 NOTE — Progress Notes (Signed)
Pt came in today for B-12 injection IM in left deltoid. Pt tolerated well.

## 2020-05-22 NOTE — Telephone Encounter (Signed)
LMTCB to schedule NV for B12 injection/teaching

## 2020-05-23 ENCOUNTER — Telehealth: Payer: Self-pay

## 2020-05-23 NOTE — Telephone Encounter (Signed)
LMTCB to discuss medication change request

## 2020-05-23 NOTE — Telephone Encounter (Signed)
Did he get any benefit from the 40 mg dose?  Has he had any side effects with the 40 mg dose?  What in particular is prompting the request to increase the dose further?

## 2020-05-23 NOTE — Telephone Encounter (Signed)
Pt's daughter called and states that pt would like Fluoxetine upped to 60mg . Please call [phone] if that is possible. Pt states that Dr [phone] had already discussed this switch with him

## 2020-05-23 NOTE — Telephone Encounter (Signed)
NV scheduled for 05/28/20 for B12 teaching

## 2020-05-24 MED ORDER — FLUOXETINE HCL 60 MG PO TABS: 60.0000 mg | ORAL_TABLET | Freq: Every day | ORAL | 3 refills | Status: DC

## 2020-05-24 NOTE — Telephone Encounter (Signed)
Prozac sent to pharmacy.  If this is not beneficial he should let us know.  Has he been able to see a psychiatrist through the [place]?  Typically I do not recheck vitamin B12 levels after starting on the injections.

## 2020-05-24 NOTE — Telephone Encounter (Signed)
Left message for Sandra to call back.

## 2020-05-24 NOTE — Telephone Encounter (Signed)
Spoke with daughter and patient about need for medication increase. They state he is not benefiting from the 40mg  dose at all; has not had any side effects its just not helping. He is having to take his xanax 0.5 tabs TID almost everyday now. Patient states he wants to get the prozac to where its working so he does not have to take as many xanax a day or as often. They both also want to know when he should have his Vit B12 levels rechecked to see if the injections are helping him?

## 2020-05-24 NOTE — Telephone Encounter (Signed)
Allen David called back and she is aware of below message. Patient has an appt this month with a psy through the [place]; didn't remember the exact date but knows its this month.

## 2020-05-28 ENCOUNTER — Ambulatory Visit: Payer: No Typology Code available for payment source

## 2020-05-29 ENCOUNTER — Other Ambulatory Visit: Payer: Self-pay

## 2020-05-29 ENCOUNTER — Ambulatory Visit (INDEPENDENT_AMBULATORY_CARE_PROVIDER_SITE_OTHER): Payer: Medicare Other

## 2020-05-29 DIAGNOSIS — E538 Deficiency of other specified B group vitamins: Secondary | ICD-10-CM | POA: Diagnosis not present

## 2020-05-29 MED ORDER — CYANOCOBALAMIN 1000 MCG/ML IJ SOLN
1000.0000 ug | Freq: Once | INTRAMUSCULAR | Status: AC
  Administered 2020-05-29: 1000 ug via INTRAMUSCULAR

## 2020-05-29 NOTE — Progress Notes (Signed)
Patient presented for B 12 injection to Left deltoid, patient voiced no concerns nor showed any signs of distress during injection.

## 2020-05-30 ENCOUNTER — Ambulatory Visit: Payer: No Typology Code available for payment source

## 2020-06-04 NOTE — Telephone Encounter (Signed)
This was addressed, patient informed to go to urgent care.  Novaleigh Kohlman,cma

## 2020-06-05 ENCOUNTER — Other Ambulatory Visit: Payer: Self-pay

## 2020-06-05 ENCOUNTER — Ambulatory Visit (INDEPENDENT_AMBULATORY_CARE_PROVIDER_SITE_OTHER): Payer: Medicare Other

## 2020-06-05 DIAGNOSIS — E538 Deficiency of other specified B group vitamins: Secondary | ICD-10-CM

## 2020-06-05 MED ORDER — CYANOCOBALAMIN 1000 MCG/ML IJ SOLN
1000.0000 ug | Freq: Once | INTRAMUSCULAR | Status: AC
  Administered 2020-06-05: 1000 ug via INTRAMUSCULAR

## 2020-06-05 NOTE — Progress Notes (Addendum)
Patient presented for B 12 injection to left deltoid, patient voiced no concerns nor showed any signs of distress during injection. 

## 2020-06-12 ENCOUNTER — Ambulatory Visit (INDEPENDENT_AMBULATORY_CARE_PROVIDER_SITE_OTHER): Payer: Medicare Other

## 2020-06-12 ENCOUNTER — Other Ambulatory Visit: Payer: Self-pay

## 2020-06-12 DIAGNOSIS — E538 Deficiency of other specified B group vitamins: Secondary | ICD-10-CM | POA: Diagnosis not present

## 2020-06-12 MED ORDER — CYANOCOBALAMIN 1000 MCG/ML IJ SOLN
1000.0000 ug | Freq: Once | INTRAMUSCULAR | Status: AC
  Administered 2020-06-12: 1000 ug via INTRAMUSCULAR

## 2020-06-12 NOTE — Progress Notes (Signed)
Patient presented for B 12 injection to left deltoid, patient voiced no concerns nor showed any signs of distress during injection. 

## 2020-07-23 ENCOUNTER — Encounter: Payer: Self-pay | Admitting: Family Medicine

## 2020-07-23 ENCOUNTER — Other Ambulatory Visit: Payer: Self-pay

## 2020-07-23 ENCOUNTER — Ambulatory Visit (INDEPENDENT_AMBULATORY_CARE_PROVIDER_SITE_OTHER): Payer: Medicare Other | Admitting: Family Medicine

## 2020-07-23 VITALS — BP 118/80 | HR 73 | Temp 97.9°F | Ht 72.0 in | Wt 168.2 lb

## 2020-07-23 DIAGNOSIS — E039 Hypothyroidism, unspecified: Secondary | ICD-10-CM | POA: Diagnosis not present

## 2020-07-23 DIAGNOSIS — Z5181 Encounter for therapeutic drug level monitoring: Secondary | ICD-10-CM

## 2020-07-23 DIAGNOSIS — B351 Tinea unguium: Secondary | ICD-10-CM

## 2020-07-23 DIAGNOSIS — E611 Iron deficiency: Secondary | ICD-10-CM | POA: Diagnosis not present

## 2020-07-23 DIAGNOSIS — F32A Depression, unspecified: Secondary | ICD-10-CM | POA: Diagnosis not present

## 2020-07-23 DIAGNOSIS — F419 Anxiety disorder, unspecified: Secondary | ICD-10-CM | POA: Diagnosis not present

## 2020-07-23 DIAGNOSIS — K589 Irritable bowel syndrome without diarrhea: Secondary | ICD-10-CM | POA: Diagnosis not present

## 2020-07-23 DIAGNOSIS — E538 Deficiency of other specified B group vitamins: Secondary | ICD-10-CM | POA: Diagnosis not present

## 2020-07-23 DIAGNOSIS — R911 Solitary pulmonary nodule: Secondary | ICD-10-CM

## 2020-07-23 MED ORDER — CYANOCOBALAMIN 1000 MCG/ML IJ SOLN
1000.0000 ug | Freq: Once | INTRAMUSCULAR | Status: AC
  Administered 2020-07-23: 1000 ug via INTRAMUSCULAR

## 2020-07-23 NOTE — Patient Instructions (Signed)
Nice to see you. We will check her TSH today. Somebody from podiatry and GI should contact you to schedule an appointment. Please follow-up with a psychiatrist at the [place].

## 2020-07-24 ENCOUNTER — Encounter: Payer: Self-pay | Admitting: *Deleted

## 2020-07-24 LAB — TSH: TSH: 3.55 u[IU]/mL (ref 0.35–4.50)

## 2020-07-26 DIAGNOSIS — E611 Iron deficiency: Secondary | ICD-10-CM | POA: Insufficient documentation

## 2020-07-26 DIAGNOSIS — Z5181 Encounter for therapeutic drug level monitoring: Secondary | ICD-10-CM | POA: Insufficient documentation

## 2020-07-26 DIAGNOSIS — B351 Tinea unguium: Secondary | ICD-10-CM | POA: Insufficient documentation

## 2020-07-26 NOTE — Assessment & Plan Note (Signed)
Seems to be relatively stable.  Managed by psychiatry at the [place].  He will continue his current regimen through them.

## 2020-07-26 NOTE — Assessment & Plan Note (Signed)
Some of his chronic intermittent issues with nausea and feeling sick on his stomach may be related to IBS.  We are referring to GI to evaluate the iron deficiency and they can discuss this as well.

## 2020-07-26 NOTE — Assessment & Plan Note (Signed)
Refer to GI 

## 2020-07-26 NOTE — Assessment & Plan Note (Signed)
It is unclear if he actually has a history of hypothyroidism.  He will remain off of Synthroid.  We will check a TSH today.

## 2020-07-26 NOTE — Assessment & Plan Note (Signed)
The patient's daughter reports he is having a follow-up scan on this in the near future.

## 2020-07-26 NOTE — Assessment & Plan Note (Signed)
Refer to podiatry

## 2020-07-26 NOTE — Assessment & Plan Note (Signed)
B12 injection given today. 

## 2020-07-26 NOTE — Progress Notes (Signed)
[name], MD Phone: [phone]  Allen David is a 87 y.o. male who presents today for follow-up.  Anxiety: Patient notes this comes and goes.  He is currently on Xanax, BuSpar, and Prozac managed by psychiatry at the [facility].  Occasional depression.  No SI.  Medication management: Previously on Synthroid though he stopped this recently.  It is unclear if he truly had hypothyroidism.  Iron deficiency: Not on an iron supplement.  He does occasionally feel sick on his stomach.  Reports a history of being on an "enzyme pill."  Then he was on a nerve pill for what sounds to be IBS.  Notes it took a few months for that to kick in.  He uses Pepto-Bismol and notes that is helpful.  Notes no abdominal pain.  Does have some nausea occasionally.  No vomiting or diarrhea.  His daughter reports the [facility] is doing a scan to follow-up on a lung nodule though is unsure if they are following up on his iron deficiency or nausea as they have not received a call regarding the results that will be scanned into the chart.  The patient's iron saturation and transferrin levels are low.  Onychomycosis: The report thickened toenails that the patient has trouble cutting himself.  They wonder about a podiatry referral.  Social History   Tobacco Use  Smoking Status Former Smoker  . Quit date: 05/19/1966  . Years since quitting: 54.2  Smokeless Tobacco Former Systems developer  Tobacco Comment   Quit in 1968    Current Outpatient Medications on File Prior to Visit  Medication Sig Dispense Refill  . ALPRAZolam (XANAX) 1 MG tablet Take 2 mg by mouth 4 (four) times daily as needed for anxiety or sleep. Patient is taking 1/2 tablet    . amLODipine (NORVASC) 5 MG tablet Take 5 mg by mouth daily.    Marland Kitchen atorvastatin (LIPITOR) 20 MG tablet Take 1 tablet (20 mg total) by mouth daily. 90 tablet 3  . baclofen (LIORESAL) 10 MG tablet Take 1 tablet (10 mg total) by mouth 3 (three) times daily as needed for muscle spasms. 30 each 0  .  busPIRone (BUSPAR) 5 MG tablet Take 1 tablet (5 mg total) by mouth 2 (two) times daily. 60 tablet 2  . FLUoxetine 60 MG TABS Take 60 mg by mouth daily. 30 tablet 3  . levothyroxine (SYNTHROID, LEVOTHROID) 25 MCG tablet Take 1 tablet (25 mcg total) by mouth daily before breakfast.    . pantoprazole (PROTONIX) 40 MG tablet Take 1 tablet (40 mg total) by mouth daily. 10 tablet 2   Current Facility-Administered Medications on File Prior to Visit  Medication Dose Route Frequency Provider Last Rate Last Admin  . betamethasone acetate-betamethasone sodium phosphate (CELESTONE) injection 3 mg  3 mg Intramuscular Once Daylene Katayama M, DPM      . cyanocobalamin ((VITAMIN B-12)) injection 1,000 mcg  1,000 mcg Intramuscular Once Leone Haven, MD         ROS see history of present illness  Objective  Physical Exam Vitals:   07/23/20 1419  BP: 118/80  Pulse: 73  Temp: 97.9 F (36.6 C)  SpO2: 99%    BP Readings from Last 3 Encounters:  07/23/20 118/80  04/24/20 103/75  04/23/20 134/82   Wt Readings from Last 3 Encounters:  07/23/20 168 lb 3.2 oz (76.3 kg)  04/24/20 162 lb 9.6 oz (73.8 kg)  04/23/20 162 lb 9.6 oz (73.8 kg)    Physical Exam Constitutional:  General: He is not in acute distress.    Appearance: He is not diaphoretic.  Cardiovascular:     Rate and Rhythm: Normal rate and regular rhythm.     Heart sounds: Normal heart sounds.  Pulmonary:     Effort: Pulmonary effort is normal.     Breath sounds: Normal breath sounds.  Musculoskeletal:     Right lower leg: No edema.     Left lower leg: No edema.  Skin:    General: Skin is warm and dry.     Comments: Onychomycosis bilateral feet  Neurological:     Mental Status: He is alert.      Assessment/Plan: Please see individual problem list.  Problem List Items Addressed This Visit    Anxiety and depression    Seems to be relatively stable.  Managed by psychiatry at the [place].  He will continue his current regimen  through them.      B12 deficiency    B12 injection given today.      IBS (irritable bowel syndrome)    Some of his chronic intermittent issues with nausea and feeling sick on his stomach may be related to IBS.  We are referring to GI to evaluate the iron deficiency and they can discuss this as well.      Incidental lung nodule, > 23mm and < 62mm    The patient's daughter reports he is having a follow-up scan on this in the near future.      Iron deficiency    Refer to GI.      Relevant Orders   Ambulatory referral to Gastroenterology   Medication monitoring encounter    It is unclear if he actually has a history of hypothyroidism.  He will remain off of Synthroid.  We will check a TSH today.      Onychomycosis - Primary    Refer to podiatry.      Relevant Orders   Ambulatory referral to Podiatry    Other Visit Diagnoses    Hypothyroidism, unspecified type       Relevant Orders   TSH (Completed)      This visit occurred during the SARS-CoV-2 public health emergency.  Safety protocols were in place, including screening questions prior to the visit, additional usage of staff PPE, and extensive cleaning of exam room while observing appropriate contact time as indicated for disinfecting solutions.   I have spent 32 minutes in the care of this patient the day of his visit regarding history taking, documentation, completion of physical exam, placing orders.   [name], MD [place]

## 2020-08-06 ENCOUNTER — Ambulatory Visit: Payer: Medicare Other | Admitting: Podiatry

## 2020-08-23 ENCOUNTER — Telehealth: Payer: Self-pay

## 2020-08-23 NOTE — Telephone Encounter (Signed)
I do not prescribe his xanax. They should contact his [place] psychiatrist.

## 2020-08-23 NOTE — Telephone Encounter (Signed)
Patient's daughter has been notified  

## 2020-08-23 NOTE — Telephone Encounter (Signed)
Pts' daughter called and states that pt is worried bc he does not have any refills of ALPRAZolam (XANAX) 1 MG tablet. Please send to [facility] on [address]

## 2020-08-24 ENCOUNTER — Ambulatory Visit: Payer: No Typology Code available for payment source

## 2020-08-27 ENCOUNTER — Ambulatory Visit (INDEPENDENT_AMBULATORY_CARE_PROVIDER_SITE_OTHER): Payer: Medicare Other

## 2020-08-27 ENCOUNTER — Other Ambulatory Visit: Payer: Self-pay

## 2020-08-27 DIAGNOSIS — E538 Deficiency of other specified B group vitamins: Secondary | ICD-10-CM | POA: Diagnosis not present

## 2020-08-27 MED ORDER — CYANOCOBALAMIN 1000 MCG/ML IJ SOLN
1000.0000 ug | Freq: Once | INTRAMUSCULAR | Status: AC
  Administered 2020-08-27: 1000 ug via INTRAMUSCULAR

## 2020-08-27 NOTE — Progress Notes (Signed)
Patient presented for B 12 injection to left deltoid, patient voiced no concerns nor showed any signs of distress during injection. 

## 2020-09-24 ENCOUNTER — Telehealth: Payer: Self-pay | Admitting: Family Medicine

## 2020-09-24 NOTE — Telephone Encounter (Signed)
Patient's daughter called and said she doesn't think they ever got a call from GI. Can you check this and let her know.

## 2020-10-01 ENCOUNTER — Ambulatory Visit: Payer: No Typology Code available for payment source

## 2020-10-17 ENCOUNTER — Ambulatory Visit: Payer: No Typology Code available for payment source

## 2020-10-23 ENCOUNTER — Telehealth: Payer: Self-pay | Admitting: Family Medicine

## 2020-10-23 NOTE — Telephone Encounter (Signed)
Left message for patient to call back and reschedule Medicare Annual Wellness Visit (AWV)  on 02/21/21 .   Please confirm in office, virtual visit, or phone call.  Please schedule at anytime with Nurse Health Advisor.

## 2020-10-24 ENCOUNTER — Ambulatory Visit: Payer: No Typology Code available for payment source | Admitting: Family Medicine

## 2020-10-24 DIAGNOSIS — Z0289 Encounter for other administrative examinations: Secondary | ICD-10-CM

## 2020-10-31 ENCOUNTER — Other Ambulatory Visit: Payer: Self-pay

## 2020-10-31 ENCOUNTER — Ambulatory Visit (INDEPENDENT_AMBULATORY_CARE_PROVIDER_SITE_OTHER): Payer: Medicare Other | Admitting: Gastroenterology

## 2020-10-31 ENCOUNTER — Encounter: Payer: Self-pay | Admitting: Gastroenterology

## 2020-10-31 VITALS — BP 146/62 | HR 71 | Temp 97.9°F | Ht 72.0 in | Wt 167.4 lb

## 2020-10-31 DIAGNOSIS — N179 Acute kidney failure, unspecified: Secondary | ICD-10-CM | POA: Insufficient documentation

## 2020-10-31 DIAGNOSIS — R12 Heartburn: Secondary | ICD-10-CM

## 2020-10-31 DIAGNOSIS — R937 Abnormal findings on diagnostic imaging of other parts of musculoskeletal system: Secondary | ICD-10-CM | POA: Insufficient documentation

## 2020-10-31 DIAGNOSIS — R8290 Unspecified abnormal findings in urine: Secondary | ICD-10-CM | POA: Insufficient documentation

## 2020-10-31 DIAGNOSIS — D649 Anemia, unspecified: Secondary | ICD-10-CM

## 2020-10-31 DIAGNOSIS — E039 Hypothyroidism, unspecified: Secondary | ICD-10-CM | POA: Insufficient documentation

## 2020-10-31 NOTE — Progress Notes (Signed)
Jonathon Bellows MD, MRCP(U.K) [address]  [place]  [place], [place] [address]  Main: [phone]  Fax: [phone]   Gastroenterology Consultation  Referring Provider:     Leone Haven, MD Primary Care Physician:  Leone Haven, MD Primary Gastroenterologist:  Dr. Jonathon Bellows  Reason for Consultation:     Iron deficiency         HPI:   Allen David is a 87 y.o. y/o male referred for consultation & management  by Dr. Caryl Bis, Angela Adam, MD.     I have reviewed the lab results scanned in February 2022 which shows a low iron saturation of 13% low transferrin B12 that was normal, folate was normal.  I cannot see a hemoglobin or ferritin checked or a percentage iron saturation.  From December  2021 hemoglobin was 11.9 g with an MCV of 90.1 and hemoglobin has been in the same range for over 3 years.Ferritin in December 2021 was 369.  With a normal percentage iron saturation of 35.  He carries a diagnosis of pernicious anemia.TSH in March 2022 was normal.  Colonoscopy in 2006 per epic has been reported normal per patient history.  The patient is here today with his daughter.  He says he suffers from anxiety longstanding causes him to have GI symptoms and he has been diagnosed with irritable bowel syndrome in the past.  He complains of heartburn for which he has been taking Pepto-Bismol for a long time.  He denies any change in bowel habits.  He has had black stools due to Pepto-Bismol.  Denies any blood in the urine.  Some weight loss which he attributes again to anxiety.  Past Medical History:  Diagnosis Date   Anxiety    Back injury    with chronic pain   CAD in native artery    Chronic low back pain    Depression    ED (erectile dysfunction)    Elevated PSA    HLD (hyperlipidemia)    HTN (hypertension)    IBS (irritable bowel syndrome)    Malaise and fatigue    Pernicious anemia     Past Surgical History:  Procedure Laterality Date   CARPAL TUNNEL RELEASE      EYE SURGERY     VASECTOMY      Prior to Admission medications   Medication Sig Start Date End Date Taking? Authorizing Provider  ALPRAZolam Duanne Moron) 1 MG tablet Take 2 mg by mouth 4 (four) times daily as needed for anxiety or sleep. Patient is taking 1/2 tablet    Verline Lema, MD  amLODipine (NORVASC) 5 MG tablet Take 5 mg by mouth daily.    [provider]  atorvastatin (LIPITOR) 20 MG tablet Take 1 tablet (20 mg total) by mouth daily. 01/05/18   Minna Merritts, MD  baclofen (LIORESAL) 10 MG tablet Take 1 tablet (10 mg total) by mouth 3 (three) times daily as needed for muscle spasms. 12/31/18   Leone Haven, MD  busPIRone (BUSPAR) 5 MG tablet Take 1 tablet (5 mg total) by mouth 2 (two) times daily. 11/18/19   Leone Haven, MD  FLUoxetine 60 MG TABS Take 60 mg by mouth daily. 05/24/20   Leone Haven, MD  levothyroxine (SYNTHROID, LEVOTHROID) 25 MCG tablet Take 1 tablet (25 mcg total) by mouth daily before breakfast. 05/14/18   McLean-Scocuzza, Nino Glow, MD  pantoprazole (PROTONIX) 40 MG tablet Take 1 tablet (40 mg total) by mouth  daily. 04/24/20 04/24/21  Nena Polio, MD    Family History  Problem Relation Age of Onset   Stroke Mother    Hypertension Mother    Ulcers Mother    Cancer Father        multiple myeloma   Diabetes Sister    Diabetes Brother    Diabetes Brother      Social History   Tobacco Use   Smoking status: Former    Pack years: 0.00    Types: Cigarettes    Quit date: 05/19/1966    Years since quitting: 54.4   Smokeless tobacco: Former   Tobacco comments:    Quit in [place] Use: Never used  Substance Use Topics   Alcohol use: Yes    Alcohol/week: 0.0 - 1.0 standard drinks    Comment: daily   Drug use: No    Allergies as of 10/31/2020 - Review Complete 10/31/2020  Allergen Reaction Noted   Lexapro [escitalopram oxalate]  09/04/2014   Tetanus toxoids Swelling 06/03/2012    Review of Systems:    All  systems reviewed and negative except where noted in HPI.   Physical Exam:  BP (!) 146/62   Pulse 71   Temp 97.9 F (36.6 C) (Oral)   Ht 6' (1.829 m)   Wt 167 lb 6.4 oz (75.9 kg)   BMI 22.70 kg/m  No LMP for male patient. Psych:  Alert and cooperative. Normal mood and affect. General:   Alert,  Well-developed, well-nourished, pleasant and cooperative in NAD Head:  Normocephalic and atraumatic. Eyes:  Sclera clear, no icterus.   Conjunctiva pink. Ears:  Normal auditory acuity. Lungs:  Respirations even and unlabored.  Clear throughout to auscultation.   No wheezes, crackles, or rhonchi. No acute distress. Heart:  Regular rate and rhythm; no murmurs, clicks, rubs, or gallops. Abdomen:  Normal bowel sounds.  No bruits.  Soft, non-tender and non-distended without masses, hepatosplenomegaly or hernias noted.  No guarding or rebound tenderness.    Neurologic:  Alert and oriented x3;  grossly normal neurologically. Psych:  Alert and cooperative. Normal mood and affect.  Imaging Studies: No results found.  Assessment and Plan:   EDRICK WHITEHORN is a 87 y.o. y/o male has been referred for iron deficiency.  Reviewed the scan labs from  February 2022 that demonstrated a low iron saturation of 13%, review of labs including CBC showed that he has had a slightly lower than normal hemoglobin for more than 3 years which has been normocytic.  Ferritin checked in December was in fact elevated and not low.  Percentage iron saturation was not elevated which would be expected in iron deficiency anemia.  At this point of time I would like to check his labs and truly determine if he is anemic and if it is a change from the baseline and if all his iron studies meet criteria for iron deficiency.  I had a long discussion with the patient and daughter that at his age we need to balance the risks of the procedure versus the benefits.  We went through the risks of anesthesia, I informed that they need to go home have  a discussion and if he is truly iron deficient decide if they would be willing to undergo GI procedures.   Plan 1.  Check CBC, iron studies including ferritin and iron percentage saturation today.  If there is a change in the hemoglobin from baseline and clear-cut evidence of iron  deficiency then we can discuss next steps obviously due to his age 8 needs to be very careful in terms of performing any procedures that would involve anesthesia and we have to appropriately weigh the risks versus benefits ratio to see if he truly would benefit from a procedure if needed.  2.  For the heartburn I suggested to commence on famotidine OTC   Follow up in video visit in 2 to 3 weeks  Dr Jonathon Bellows MD,MRCP(U.K)

## 2020-11-01 LAB — CBC WITH DIFFERENTIAL/PLATELET
Basophils Absolute: 0 10*3/uL (ref 0.0–0.2)
Basos: 1 %
EOS (ABSOLUTE): 0.1 10*3/uL (ref 0.0–0.4)
Eos: 2 %
Hematocrit: 29.3 % — ABNORMAL LOW (ref 37.5–51.0)
Hemoglobin: 9.7 g/dL — ABNORMAL LOW (ref 13.0–17.7)
Immature Grans (Abs): 0 10*3/uL (ref 0.0–0.1)
Immature Granulocytes: 0 %
Lymphocytes Absolute: 1.7 10*3/uL (ref 0.7–3.1)
Lymphs: 43 %
MCH: 29.9 pg (ref 26.6–33.0)
MCHC: 33.1 g/dL (ref 31.5–35.7)
MCV: 90 fL (ref 79–97)
Monocytes Absolute: 0.3 10*3/uL (ref 0.1–0.9)
Monocytes: 7 %
Neutrophils Absolute: 1.9 10*3/uL (ref 1.4–7.0)
Neutrophils: 47 %
Platelets: 114 10*3/uL — ABNORMAL LOW (ref 150–450)
RBC: 3.24 x10E6/uL — ABNORMAL LOW (ref 4.14–5.80)
RDW: 14 % (ref 11.6–15.4)
WBC: 4 10*3/uL (ref 3.4–10.8)

## 2020-11-01 LAB — IRON,TIBC AND FERRITIN PANEL
Ferritin: 629 ng/mL — ABNORMAL HIGH (ref 30–400)
Iron Saturation: 29 % (ref 15–55)
Iron: 58 ug/dL (ref 38–169)
Total Iron Binding Capacity: 198 ug/dL — ABNORMAL LOW (ref 250–450)
UIBC: 140 ug/dL (ref 111–343)

## 2020-11-05 ENCOUNTER — Encounter: Payer: Self-pay | Admitting: Gastroenterology

## 2020-11-05 ENCOUNTER — Telehealth: Payer: Self-pay | Admitting: Gastroenterology

## 2020-11-05 NOTE — Telephone Encounter (Signed)
Call to discuss results

## 2020-11-05 NOTE — Telephone Encounter (Signed)
Dr. Vicente Males reviewed the labs and he stated the following through secure chat: Labs do not show a clear evidence of iron deficiency , I will send Dr Caryl Bis a message , next step if anything need to find out if its a bone marrow issue. I do not think needs GI work up for now , if my discussion with Dr Caryl Bis changes then will let patient know. I then called patient back to let him know what Dr. Vicente Males stated. However, once Dr. Vicente Males and Dr. Caryl Bis agreed on something I told him that we or Dr. Tharon Aquas' office would get in contact with him. Patient agreed and had no further questions.

## 2020-11-09 ENCOUNTER — Other Ambulatory Visit: Payer: Self-pay

## 2020-11-09 ENCOUNTER — Ambulatory Visit (INDEPENDENT_AMBULATORY_CARE_PROVIDER_SITE_OTHER): Payer: Medicare Other | Admitting: Family Medicine

## 2020-11-09 ENCOUNTER — Encounter: Payer: Self-pay | Admitting: Family Medicine

## 2020-11-09 ENCOUNTER — Telehealth: Payer: Self-pay

## 2020-11-09 VITALS — BP 118/78 | HR 78 | Temp 98.1°F | Ht 72.0 in | Wt 161.6 lb

## 2020-11-09 DIAGNOSIS — M5442 Lumbago with sciatica, left side: Secondary | ICD-10-CM | POA: Diagnosis not present

## 2020-11-09 DIAGNOSIS — F419 Anxiety disorder, unspecified: Secondary | ICD-10-CM | POA: Diagnosis not present

## 2020-11-09 DIAGNOSIS — R5383 Other fatigue: Secondary | ICD-10-CM | POA: Diagnosis not present

## 2020-11-09 DIAGNOSIS — E611 Iron deficiency: Secondary | ICD-10-CM

## 2020-11-09 DIAGNOSIS — Z8639 Personal history of other endocrine, nutritional and metabolic disease: Secondary | ICD-10-CM | POA: Insufficient documentation

## 2020-11-09 DIAGNOSIS — K219 Gastro-esophageal reflux disease without esophagitis: Secondary | ICD-10-CM | POA: Diagnosis not present

## 2020-11-09 DIAGNOSIS — G8929 Other chronic pain: Secondary | ICD-10-CM | POA: Diagnosis not present

## 2020-11-09 DIAGNOSIS — F32A Depression, unspecified: Secondary | ICD-10-CM | POA: Diagnosis not present

## 2020-11-09 DIAGNOSIS — E538 Deficiency of other specified B group vitamins: Secondary | ICD-10-CM

## 2020-11-09 DIAGNOSIS — D649 Anemia, unspecified: Secondary | ICD-10-CM

## 2020-11-09 LAB — TSH: TSH: 5.01 u[IU]/mL — ABNORMAL HIGH (ref 0.35–4.50)

## 2020-11-09 MED ORDER — CYANOCOBALAMIN 1000 MCG/ML IJ SOLN
1000.0000 ug | Freq: Once | INTRAMUSCULAR | Status: AC
  Administered 2020-11-09: 1000 ug via INTRAMUSCULAR

## 2020-11-09 MED ORDER — "LUER LOCK SAFETY SYRINGES 25G X 1"" 3 ML MISC": 1.0000 mL | 0 refills | Status: DC

## 2020-11-09 MED ORDER — PANTOPRAZOLE SODIUM 40 MG PO TBEC: 40.0000 mg | DELAYED_RELEASE_TABLET | Freq: Every day | ORAL | 3 refills | Status: DC

## 2020-11-09 MED ORDER — CYANOCOBALAMIN 1000 MCG/ML IJ SOLN: 1000.0000 ug | INTRAMUSCULAR | 0 refills | Status: DC

## 2020-11-09 NOTE — Progress Notes (Signed)
[name], MD Phone: [phone]  Allen David is a 87 y.o. male who presents today for follow-up.  Anemia/thrombocytopenia: The patient previously had labs at the [facility] that revealed some aspects concerning for iron deficiency.  He saw GI and they rechecked labs that noted his iron levels are normalized.  They recommended him seeing hematology.  A referral has been placed already.  GERD: Patient takes Pepto-Bismol most days to help with this.  He is not on a PPI.  He notes no abdominal pain.  No blood in stool.  No dysphagia.  History of hypothyroidism: Patient reports in the past his TSH has been a little high and he was started on levothyroxine though this was discontinued at some point.  He does note chronic low energy levels.  No skin changes.  He has chronic cold intolerance.  Depression/decreased energy level: Patient notes his energy is just not there particularly when he wakes up in the morning.  He feels better after he gets his medications in him and wants he gets going.  If he gets distracted with an activity he feels well overall.  [facility] has been managing his [facility] medications and he is currently on Prozac and BuSpar with as needed Xanax.  Does note some depression though no SI.  In the past we have helped in management with his medications though currently his [place] is managing this.  Chronic back pain: The patient has seen multiple physicians for this.  He was seen [facility] previously and eventually went to the [facility] who referred him to a Psychologist, sport and exercise in [place].  He reports he is disgusted with the [place] [facility] [place] regarding his back pain and feels as though they have done all that he can do.  He has been refusing physical therapy.  B12 deficiency: The patient is due for B12 injection.  His daughter would like to be taught how to complete the injections.  Social History   Tobacco Use  Smoking Status Former   Pack years: 0.00   Types: Cigarettes   Quit date:  05/19/1966   Years since quitting: 54.5  Smokeless Tobacco Former  Tobacco Comments   Quit in 1968    Current Outpatient Medications on File Prior to Visit  Medication Sig Dispense Refill   ALPRAZolam (XANAX) 1 MG tablet Take 2 mg by mouth 4 (four) times daily as needed for anxiety or sleep. Patient is taking 1/2 tablet     amLODipine (NORVASC) 5 MG tablet Take 5 mg by mouth daily.     aspirin 81 MG chewable tablet Chew 1 tablet by mouth 1 day or 1 dose.     atorvastatin (LIPITOR) 20 MG tablet Take 1 tablet (20 mg total) by mouth daily. 90 tablet 3   baclofen (LIORESAL) 10 MG tablet Take 1 tablet (10 mg total) by mouth 3 (three) times daily as needed for muscle spasms. 30 each 0   bismuth subsalicylate (PEPTO BISMOL) 262 MG/15ML suspension Take 30 mLs by mouth every 6 (six) hours as needed.     busPIRone (BUSPAR) 5 MG tablet Take 1 tablet (5 mg total) by mouth 2 (two) times daily. 60 tablet 2   FLUoxetine 60 MG TABS Take 60 mg by mouth daily. 30 tablet 3   levothyroxine (SYNTHROID, LEVOTHROID) 25 MCG tablet Take 1 tablet (25 mcg total) by mouth daily before breakfast. (Patient not taking: Reported on 11/09/2020)     magnesium hydroxide (MILK OF MAGNESIA) 400 MG/5ML suspension Take by mouth daily as  needed for mild constipation. (Patient not taking: Reported on 11/09/2020)     Current Facility-Administered Medications on File Prior to Visit  Medication Dose Route Frequency Provider Last Rate Last Admin   betamethasone acetate-betamethasone sodium phosphate (CELESTONE) injection 3 mg  3 mg Intramuscular Once Daylene Katayama M, DPM       cyanocobalamin ((VITAMIN B-12)) injection 1,000 mcg  1,000 mcg Intramuscular Once Leone Haven, MD         ROS see history of present illness  Objective  Physical Exam Vitals:   11/09/20 1200  BP: 118/78  Pulse: 78  Temp: 98.1 F (36.7 C)  SpO2: 99%    BP Readings from Last 3 Encounters:  11/09/20 118/78  10/31/20 (!) 146/62  07/23/20 118/80    Wt Readings from Last 3 Encounters:  11/09/20 161 lb 9.6 oz (73.3 kg)  10/31/20 167 lb 6.4 oz (75.9 kg)  07/23/20 168 lb 3.2 oz (76.3 kg)    Physical Exam Constitutional:      General: He is not in acute distress.    Appearance: He is not diaphoretic.  Cardiovascular:     Rate and Rhythm: Normal rate and regular rhythm.     Heart sounds: Normal heart sounds.  Pulmonary:     Effort: Pulmonary effort is normal.     Breath sounds: Normal breath sounds.  Musculoskeletal:     Right lower leg: No edema.     Left lower leg: No edema.  Skin:    General: Skin is warm and dry.  Neurological:     Mental Status: He is alert.     Assessment/Plan: Please see individual problem list.  Problem List Items Addressed This Visit     Anemia    Undetermined cause.  He will see hematology as discussed.       Relevant Medications   cyanocobalamin (,VITAMIN B-12,) 1000 MCG/ML injection   Anxiety and depression - Primary    This is a chronic ongoing issue.  He seems to continue to have some issues with this and I discussed this may be affecting his energy level.  I am going to defer any medication changes to his team at the [facility] as they are managing this issue.  I encouraged him to follow-up with them.       B12 deficiency    B12 injection given today.  Kerin Salen, RN provided injection teaching to the patient's daughter.       Relevant Medications   cyanocobalamin (,VITAMIN B-12,) 1000 MCG/ML injection   SYRINGE-NEEDLE, DISP, 3 ML (LUER LOCK SAFETY SYRINGES) 25G X 1" 3 ML MISC   Chronic low back pain    Chronic issue.  This has been managed by the [facility] [place] most recently.  I discussed his options moving forward are to continue his current course of not doing much for this or see a new specialist or return to one of his prior specialists.  He will let us know how he would like to proceed with this.       GERD (gastroesophageal reflux disease)    This is a chronic issue that is inadequately  treated.  We will start on Protonix.  This was sent to his pharmacy.       Relevant Medications   pantoprazole (PROTONIX) 40 MG tablet   History of hypothyroidism    Check TSH.  Discussed this could be contributing to his decreased energy level.       Iron deficiency    Iron levels  returned to the acceptable range.  He has been referred to hematology by GI for further evaluation of his anemia and thrombocytopenia.  I agree that this is the best step moving forward.       Other Visit Diagnoses     Fatigue, unspecified type       Relevant Orders   TSH       Return in about 3 months (around 02/09/2021).  This visit occurred during the SARS-CoV-2 public health emergency.  Safety protocols were in place, including screening questions prior to the visit, additional usage of staff PPE, and extensive cleaning of exam room while observing appropriate contact time as indicated for disinfecting solutions.   I have spent 43 minutes in the care of this patient regarding history taking, documentation, discussion of plan, placing orders.   [name], MD [place]

## 2020-11-09 NOTE — Telephone Encounter (Signed)
Called patient but left a detailed message since he did not answer. I will also send the referral to Dr. Tasia Catchings so he could be seen per Dr. Georgeann Oppenheim request.

## 2020-11-09 NOTE — Assessment & Plan Note (Signed)
Chronic issue.  This has been managed by the [facility] most recently.  I discussed his options moving forward are to continue his current course of not doing much for this or see a new specialist or return to one of his prior specialists.  He will let us know how he would like to proceed with this.

## 2020-11-09 NOTE — Progress Notes (Signed)
Patient given injection into left deltoid with demonstration of injection technique and Asepsis to caretaker who will be giving future injection . Teaching confirmed with feedback from caregiver on correct technique, with site rotation and Asepsis.

## 2020-11-09 NOTE — Progress Notes (Signed)
[place] inform patient  1. Labs do not suggest clear evidence of iron deficiency anemia or anemia of chronic disease  2. Suggest refer to Dr Tasia Catchings for further evaluation to r/o ? MDS 3. From GI point of view no procedures for now unless hematology evaluation says may help

## 2020-11-09 NOTE — Assessment & Plan Note (Signed)
Iron levels returned to the acceptable range.  He has been referred to hematology by GI for further evaluation of his anemia and thrombocytopenia.  I agree that this is the best step moving forward.

## 2020-11-09 NOTE — Assessment & Plan Note (Signed)
Undetermined cause.  He will see hematology as discussed.

## 2020-11-09 NOTE — Assessment & Plan Note (Signed)
This is a chronic issue that is inadequately treated.  We will start on Protonix.  This was sent to his pharmacy.

## 2020-11-09 NOTE — Assessment & Plan Note (Signed)
Check TSH.  Discussed this could be contributing to his decreased energy level.

## 2020-11-09 NOTE — Assessment & Plan Note (Signed)
B12 injection given today.  Kerin Salen, RN provided injection teaching to the patient's daughter.

## 2020-11-09 NOTE — Assessment & Plan Note (Signed)
This is a chronic ongoing issue.  He seems to continue to have some issues with this and I discussed this may be affecting his energy level.  I am going to defer any medication changes to his team at the [facility] as they are managing this issue.  I encouraged him to follow-up with them.

## 2020-11-09 NOTE — Telephone Encounter (Signed)
-----   Message from Jonathon Bellows, MD sent at 11/09/2020  8:59 AM EDT ----- Allen David inform patient  1. Labs do not suggest clear evidence of iron deficiency anemia or anemia of chronic disease  2. Suggest refer to Dr Tasia Catchings for further evaluation to r/o ? MDS 3. From GI point of view no procedures for now unless hematology evaluation says may help

## 2020-11-09 NOTE — Patient Instructions (Signed)
Nice to see you. We will contact you with your lab results.  Please start the protonix for your reflux.  Please let me know when you decide on seeing a doctor for your back.

## 2020-11-11 ENCOUNTER — Other Ambulatory Visit: Payer: Self-pay | Admitting: Family Medicine

## 2020-11-11 DIAGNOSIS — E038 Other specified hypothyroidism: Secondary | ICD-10-CM

## 2020-11-12 NOTE — Progress Notes (Signed)
Left message to return call 

## 2020-11-22 ENCOUNTER — Other Ambulatory Visit: Payer: Self-pay

## 2020-11-22 ENCOUNTER — Telehealth: Payer: Medicare Other | Admitting: Gastroenterology

## 2020-11-23 ENCOUNTER — Other Ambulatory Visit: Payer: Self-pay

## 2020-11-23 ENCOUNTER — Inpatient Hospital Stay: Payer: Medicare Other | Attending: Oncology | Admitting: Oncology

## 2020-11-23 ENCOUNTER — Inpatient Hospital Stay: Payer: Medicare Other

## 2020-11-23 ENCOUNTER — Encounter: Payer: Self-pay | Admitting: Oncology

## 2020-11-23 VITALS — BP 121/62 | HR 71 | Temp 96.8°F | Resp 18 | Wt 162.0 lb

## 2020-11-23 DIAGNOSIS — Z87891 Personal history of nicotine dependence: Secondary | ICD-10-CM | POA: Diagnosis not present

## 2020-11-23 DIAGNOSIS — R700 Elevated erythrocyte sedimentation rate: Secondary | ICD-10-CM | POA: Insufficient documentation

## 2020-11-23 DIAGNOSIS — E538 Deficiency of other specified B group vitamins: Secondary | ICD-10-CM | POA: Diagnosis not present

## 2020-11-23 DIAGNOSIS — E785 Hyperlipidemia, unspecified: Secondary | ICD-10-CM | POA: Diagnosis not present

## 2020-11-23 DIAGNOSIS — Z887 Allergy status to serum and vaccine status: Secondary | ICD-10-CM | POA: Diagnosis not present

## 2020-11-23 DIAGNOSIS — D696 Thrombocytopenia, unspecified: Secondary | ICD-10-CM | POA: Diagnosis not present

## 2020-11-23 DIAGNOSIS — F4322 Adjustment disorder with anxiety: Secondary | ICD-10-CM | POA: Insufficient documentation

## 2020-11-23 DIAGNOSIS — I10 Essential (primary) hypertension: Secondary | ICD-10-CM | POA: Insufficient documentation

## 2020-11-23 DIAGNOSIS — Z823 Family history of stroke: Secondary | ICD-10-CM | POA: Insufficient documentation

## 2020-11-23 DIAGNOSIS — D649 Anemia, unspecified: Secondary | ICD-10-CM | POA: Diagnosis not present

## 2020-11-23 DIAGNOSIS — R7989 Other specified abnormal findings of blood chemistry: Secondary | ICD-10-CM | POA: Insufficient documentation

## 2020-11-23 DIAGNOSIS — Z8249 Family history of ischemic heart disease and other diseases of the circulatory system: Secondary | ICD-10-CM | POA: Insufficient documentation

## 2020-11-23 DIAGNOSIS — N179 Acute kidney failure, unspecified: Secondary | ICD-10-CM

## 2020-11-23 DIAGNOSIS — Z79899 Other long term (current) drug therapy: Secondary | ICD-10-CM | POA: Insufficient documentation

## 2020-11-23 DIAGNOSIS — F419 Anxiety disorder, unspecified: Secondary | ICD-10-CM | POA: Insufficient documentation

## 2020-11-23 DIAGNOSIS — Z833 Family history of diabetes mellitus: Secondary | ICD-10-CM | POA: Diagnosis not present

## 2020-11-23 DIAGNOSIS — Z808 Family history of malignant neoplasm of other organs or systems: Secondary | ICD-10-CM | POA: Insufficient documentation

## 2020-11-23 DIAGNOSIS — Z888 Allergy status to other drugs, medicaments and biological substances status: Secondary | ICD-10-CM | POA: Insufficient documentation

## 2020-11-23 DIAGNOSIS — K589 Irritable bowel syndrome without diarrhea: Secondary | ICD-10-CM | POA: Diagnosis not present

## 2020-11-23 DIAGNOSIS — R5383 Other fatigue: Secondary | ICD-10-CM | POA: Diagnosis not present

## 2020-11-23 DIAGNOSIS — R768 Other specified abnormal immunological findings in serum: Secondary | ICD-10-CM | POA: Diagnosis not present

## 2020-11-23 DIAGNOSIS — F32A Depression, unspecified: Secondary | ICD-10-CM | POA: Diagnosis not present

## 2020-11-23 DIAGNOSIS — K219 Gastro-esophageal reflux disease without esophagitis: Secondary | ICD-10-CM | POA: Diagnosis not present

## 2020-11-23 DIAGNOSIS — I2510 Atherosclerotic heart disease of native coronary artery without angina pectoris: Secondary | ICD-10-CM | POA: Diagnosis not present

## 2020-11-23 DIAGNOSIS — E611 Iron deficiency: Secondary | ICD-10-CM

## 2020-11-23 LAB — CBC WITH DIFFERENTIAL/PLATELET
Abs Immature Granulocytes: 0.02 10*3/uL (ref 0.00–0.07)
Basophils Absolute: 0 10*3/uL (ref 0.0–0.1)
Basophils Relative: 0 %
Eosinophils Absolute: 0.1 10*3/uL (ref 0.0–0.5)
Eosinophils Relative: 1 %
HCT: 31.8 % — ABNORMAL LOW (ref 39.0–52.0)
Hemoglobin: 10.4 g/dL — ABNORMAL LOW (ref 13.0–17.0)
Immature Granulocytes: 0 %
Lymphocytes Relative: 27 %
Lymphs Abs: 1.3 10*3/uL (ref 0.7–4.0)
MCH: 29.9 pg (ref 26.0–34.0)
MCHC: 32.7 g/dL (ref 30.0–36.0)
MCV: 91.4 fL (ref 80.0–100.0)
Monocytes Absolute: 0.4 10*3/uL (ref 0.1–1.0)
Monocytes Relative: 7 %
Neutro Abs: 3.1 10*3/uL (ref 1.7–7.7)
Neutrophils Relative %: 65 %
Platelets: 129 10*3/uL — ABNORMAL LOW (ref 150–400)
RBC: 3.48 MIL/uL — ABNORMAL LOW (ref 4.22–5.81)
RDW: 15.3 % (ref 11.5–15.5)
WBC: 4.9 10*3/uL (ref 4.0–10.5)
nRBC: 0 % (ref 0.0–0.2)

## 2020-11-23 LAB — RETICULOCYTES
Immature Retic Fract: 12.1 % (ref 2.3–15.9)
RBC.: 3.41 MIL/uL — ABNORMAL LOW (ref 4.22–5.81)
Retic Count, Absolute: 47.4 10*3/uL (ref 19.0–186.0)
Retic Ct Pct: 1.4 % (ref 0.4–3.1)

## 2020-11-23 LAB — VITAMIN B12: Vitamin B-12: 531 pg/mL (ref 180–914)

## 2020-11-23 LAB — SEDIMENTATION RATE: Sed Rate: 63 mm/hr — ABNORMAL HIGH (ref 0–20)

## 2020-11-23 NOTE — Progress Notes (Signed)
Patient here for initial oncology appointment, expresses concerns of fatigue and  nausea

## 2020-11-24 LAB — HAPTOGLOBIN: Haptoglobin: 182 mg/dL (ref 38–329)

## 2020-11-24 LAB — ERYTHROPOIETIN: Erythropoietin: 32 m[IU]/mL — ABNORMAL HIGH (ref 2.6–18.5)

## 2020-11-25 ENCOUNTER — Encounter: Payer: Self-pay | Admitting: Oncology

## 2020-11-25 NOTE — Progress Notes (Signed)
Hematology/Oncology Consult note [facility] [phone] [phone]  Patient Care Team: Leone Haven, MD as PCP - General (Family Medicine) Minna Merritts, MD as Consulting Physician (Cardiology)   Name of the patient: Allen David  [id number]  [birth year]    Reason for referral- anemia   Referring physician- Dr. Vicente Males  Date of visit: 11/25/20   History of presenting illness- Patient is a 87 year old male with a past medical history significant for CAD, irritable bowel syndrome hypertension hyperlipidemia among other medical problems.  He has been referred for anemia.  Patient was initially seen by Dr. Vicente Males for consideration of iron deficiency anemia and possible endoscopy.  However on his work-up he was noted to have normal iron studies.  Serum iron was normal at 58 with a low TIBC of 198 and elevated ferritin of 629 more indicative of anemia of chronic disease.  He has therefore been referred to me for further work-up of his anemia.  On looking back at his prior CBCs patient's hemoglobin was closer to 13 in 2017 and slowly started drifting down since then.  In 2018 it was close to 12 in 2020 drifted down to 11 and then more recently on 10/31/2020 he was noted to have a hemoglobin of 9.7.  Around 2020 was also noted to have mild thrombocytopenia with a platelet count fluctuating between 1 30-1 40.  On 10/31/2020 his platelet count was 114.  Patient is here with his daughter today.  He is doing overall well for his age.  Appetite and weight have remained stable.  He remains independent of his ADLs but does require some assistance with IADLs.  ECOG PS- 1  Pain scale- 0   Review of systems- Review of Systems  Constitutional:  Positive for malaise/fatigue. Negative for chills, fever and weight loss.  HENT:  Negative for congestion, ear discharge and nosebleeds.   Eyes:  Negative for blurred vision.  Respiratory:  Negative for cough,  hemoptysis, sputum production, shortness of breath and wheezing.   Cardiovascular:  Negative for chest pain, palpitations, orthopnea and claudication.  Gastrointestinal:  Negative for abdominal pain, blood in stool, constipation, diarrhea, heartburn, melena, nausea and vomiting.  Genitourinary:  Negative for dysuria, flank pain, frequency, hematuria and urgency.  Musculoskeletal:  Negative for back pain, joint pain and myalgias.  Skin:  Negative for rash.  Neurological:  Negative for dizziness, tingling, focal weakness, seizures, weakness and headaches.  Endo/Heme/Allergies:  Does not bruise/bleed easily.  Psychiatric/Behavioral:  Negative for depression and suicidal ideas. The patient does not have insomnia.    Allergies  Allergen Reactions   Lexapro [Escitalopram Oxalate]     hypertension   Tetanus Toxoids Swelling    Patient Active Problem List   Diagnosis Date Noted   Adjustment disorder with anxiety 11/23/2020   History of hypothyroidism 11/09/2020   GERD (gastroesophageal reflux disease) 11/09/2020   Anemia 11/09/2020   Abnormal findings on diagnostic imaging of other parts of musculoskeletal system 10/31/2020   Acute renal failure ([place]) 10/31/2020   Hypothyroidism 10/31/2020   Unspecified abnormal findings in urine 10/31/2020   Onychomycosis 07/26/2020   Iron deficiency 07/26/2020   Medication monitoring encounter 07/26/2020   Nausea 04/26/2020   B12 deficiency 04/26/2020   Chronic left shoulder pain 11/16/2019   IBS (irritable bowel syndrome) 07/31/2019   Right leg pain 03/25/2019   Branch retinal vein occlusion 12/13/2018   Muscle cramps 11/03/2018   Difficulty hearing 09/20/2018   Leg pain 09/20/2018   Proteinuria  05/30/2018   Carpal tunnel syndrome 05/30/2018   Fatty liver 05/14/2018   Aortic atherosclerosis ([place]) 01/05/2018   Neuropathy 12/26/2017   Allergic rhinitis 09/16/2017   Muscular chest pain 08/21/2017   Adrenal adenoma 03/11/2017   Incidental lung  nodule, > 74m and < 87m10/24/2018   Right foot pain 11/04/2016   Other specified abdominal hernia without obstruction or gangrene 11/04/2016   Skin lesion 01/01/2015   Arthralgia of both knees 09/04/2014   Pernicious anemia 12/20/2013   Hypertension 06/03/2012   Chronic low back pain 06/03/2012   Anxiety and depression 06/03/2012   Hyperlipidemia 01/20/2012   CAD, NATIVE VESSEL 01/09/2010     Past Medical History:  Diagnosis Date   Anxiety    Back injury    with chronic pain   CAD in native artery    Chronic low back pain    Depression    ED (erectile dysfunction)    Elevated PSA    HLD (hyperlipidemia)    HTN (hypertension)    IBS (irritable bowel syndrome)    Malaise and fatigue    Pernicious anemia    Vitamin B deficiency      Past Surgical History:  Procedure Laterality Date   CARPAL TUNNEL RELEASE     EYE SURGERY     VASECTOMY      Social History   Socioeconomic History   Marital status: Single    Spouse name: Not on file   Number of children: Not on file   Years of education: Not on file   Highest education level: Not on file  Occupational History   Occupation: Retired  Tobacco Use   Smoking status: Former    Pack years: 0.00    Types: Cigarettes    Quit date: 05/19/1966    Years since quitting: 54.5   Smokeless tobacco: Former   Tobacco comments:    Quit in [place]   Vaping Use: Never used  Substance and Sexual Activity   Alcohol use: Yes    Alcohol/week: 0.0 - 1.0 standard drinks    Comment: daily   Drug use: No   Sexual activity: Not Currently  Other Topics Concern   Not on file  Social History Narrative   [place] Children, 4 girls.      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      Regularly exercise- rides bike and mows lawn. Divorced.   Social Determinants of Health   Financial Resource Strain: Low Risk    Difficulty of Paying Living Expenses: Not hard at  all  Food Insecurity: No Food Insecurity   Worried About RuCharity fundraisern the Last Year: Never true   [place] the Last Year: Never true  Transportation Needs: No Transportation Needs   Lack of Transportation (Medical): No   Lack of Transportation (Non-Medical): No  Physical Activity: Not on file  Stress: No Stress Concern Present   Feeling of Stress : Only a little  Social Connections: Not on file  Intimate Partner Violence: Not on file     Family History  Problem Relation Age of Onset   Stroke Mother    Hypertension Mother    Ulcers Mother    Cancer Father        multiple myeloma   Diabetes Sister    Diabetes Brother    Diabetes Brother  Current Outpatient Medications:    ALPRAZolam (XANAX) 1 MG tablet, Take 2 mg by mouth 4 (four) times daily as needed for anxiety or sleep. Patient is taking 1/2 tablet, Disp: , Rfl:    amLODipine (NORVASC) 5 MG tablet, Take 5 mg by mouth daily., Disp: , Rfl:    aspirin 81 MG chewable tablet, Chew 1 tablet by mouth 1 day or 1 dose., Disp: , Rfl:    atorvastatin (LIPITOR) 20 MG tablet, Take 1 tablet (20 mg total) by mouth daily., Disp: 90 tablet, Rfl: 3   baclofen (LIORESAL) 10 MG tablet, Take 1 tablet (10 mg total) by mouth 3 (three) times daily as needed for muscle spasms., Disp: 30 each, Rfl: 0   bismuth subsalicylate (PEPTO BISMOL) 262 MG/15ML suspension, Take 30 mLs by mouth every 6 (six) hours as needed., Disp: , Rfl:    busPIRone (BUSPAR) 5 MG tablet, Take 1 tablet (5 mg total) by mouth 2 (two) times daily., Disp: 60 tablet, Rfl: 2   cyanocobalamin (,VITAMIN B-12,) 1000 MCG/ML injection, Inject 1 mL (1,000 mcg total) into the muscle every 30 (thirty) days., Disp: 12 mL, Rfl: 0   FLUoxetine 60 MG TABS, Take 60 mg by mouth daily., Disp: 30 tablet, Rfl: 3   magnesium hydroxide (MILK OF MAGNESIA) 400 MG/5ML suspension, Take by mouth daily as needed for mild constipation., Disp: , Rfl:    pantoprazole (PROTONIX) 40 MG  tablet, Take 1 tablet (40 mg total) by mouth daily., Disp: 30 tablet, Rfl: 3   SYRINGE-NEEDLE, DISP, 3 ML (LUER LOCK SAFETY SYRINGES) 25G X 1" 3 ML MISC, Inject 1 mL into the muscle every 30 (thirty) days., Disp: 12 each, Rfl: 0   levothyroxine (SYNTHROID, LEVOTHROID) 25 MCG tablet, Take 1 tablet (25 mcg total) by mouth daily before breakfast. (Patient not taking: No sig reported), Disp: , Rfl:   Current Facility-Administered Medications:    betamethasone acetate-betamethasone sodium phosphate (CELESTONE) injection 3 mg, 3 mg, Intramuscular, Once, Evans, Brent M, DPM   cyanocobalamin ((VITAMIN B-12)) injection 1,000 mcg, 1,000 mcg, Intramuscular, Once, Leone Haven, MD   Physical exam:  Vitals:   11/23/20 1456 11/23/20 1458  BP:  121/62  Pulse: 71   Resp: 18   Temp: (!) 96.8 F (36 C)   TempSrc: Tympanic   SpO2: 100%   Weight: 162 lb (73.5 kg)    Physical Exam Constitutional:      General: He is not in acute distress. Cardiovascular:     Rate and Rhythm: Normal rate and regular rhythm.     Heart sounds: Normal heart sounds.  Pulmonary:     Effort: Pulmonary effort is normal.     Breath sounds: Normal breath sounds.  Abdominal:     General: Bowel sounds are normal.     Palpations: Abdomen is soft.     Comments: No palpable hepatosplenomegaly  Lymphadenopathy:     Comments: No palpable cervical, supraclavicular, axillary or inguinal adenopathy    Skin:    General: Skin is warm and dry.  Neurological:     Mental Status: He is alert and oriented to person, place, and time.       CMP Latest Ref Rng & Units 04/24/2020  Glucose 70 - 99 mg/dL 126(H)  BUN 8 - 23 mg/dL 31(H)  Creatinine 0.61 - 1.24 mg/dL 1.03  Sodium 135 - 145 mmol/L 136  Potassium 3.5 - 5.1 mmol/L 3.7  Chloride 98 - 111 mmol/L 101  CO2 22 - 32 mmol/L 24  Calcium 8.9 - 10.3 mg/dL 9.4  Total Protein 6.5 - 8.1 g/dL 7.0  Total Bilirubin 0.3 - 1.2 mg/dL 0.8  Alkaline Phos 38 - 126 U/L 48  AST 15 - 41  U/L 30  ALT 0 - 44 U/L 43   CBC Latest Ref Rng & Units 11/23/2020  WBC 4.0 - 10.5 K/uL 4.9  Hemoglobin 13.0 - 17.0 g/dL 10.4(L)  Hematocrit 39.0 - 52.0 % 31.8(L)  Platelets 150 - 400 K/uL 129(L)   Assessment and plan- Patient is a 87 y.o. male referred for normocytic anemia  Patient's hemoglobin is gradually drifted down over the last 5 years and his most recent hemoglobin was 9.6.  Mild thrombocytopenia of 114.Iron studies indicative of anemia of chronic disease given elevated ferritin and low TIBC.  No conclusive evidence of iron deficiency.  Thyroid levels were normal.  Today I will do a complete anemia work-up including ESR, ANA comprehensive panel, myeloma panel, serum free light chains, haptoglobin reticulocyte count B1 and B12 levels.  Patient does not have any known chronic kidney disease that would explain his anemia either.  At his age given his concomitant anemia and thrombocytopenia MDS is a possibility.  However his cytopenias are not bad enough to warrant a bone marrow biopsy at this time.  If peripheral blood work does not reveal any obvious cause of anemia I am inclined to monitor his counts conservatively at this time with consideration for bone marrow biopsy in the future if his cytopenias worsen.  I will have a video visit with the patient in 1 to 2 weeks time   Thank you for this kind referral and the opportunity to participate in the care of this patient   Visit Diagnosis 1. Normocytic anemia   2. B12 deficiency     Dr. Randa Evens, MD, MPH [facility] at [facility] [phone] 11/25/2020 10:52 AM

## 2020-11-26 LAB — KAPPA/LAMBDA LIGHT CHAINS
Kappa free light chain: 33.8 mg/L — ABNORMAL HIGH (ref 3.3–19.4)
Kappa, lambda light chain ratio: 1.22 (ref 0.26–1.65)
Lambda free light chains: 27.7 mg/L — ABNORMAL HIGH (ref 5.7–26.3)

## 2020-11-27 LAB — ANA COMPREHENSIVE PANEL
Anti JO-1: <0.2 AI (ref 0.0–0.9)
Centromere Ab Screen: <0.2 AI (ref 0.0–0.9)
Chromatin Ab SerPl-aCnc: <0.2 AI (ref 0.0–0.9)
ENA SM Ab Ser-aCnc: <0.2 AI (ref 0.0–0.9)
Ribonucleic Protein: <0.2 AI (ref 0.0–0.9)
SSA (Ro) (ENA) Antibody, IgG: >8 AI — ABNORMAL HIGH (ref 0.0–0.9)
SSB (La) (ENA) Antibody, IgG: 5.7 AI — ABNORMAL HIGH (ref 0.0–0.9)
Scleroderma (Scl-70) (ENA) Antibody, IgG: <0.2 AI (ref 0.0–0.9)
ds DNA Ab: <1 IU/mL (ref 0–9)

## 2020-11-27 LAB — MULTIPLE MYELOMA PANEL, SERUM
Albumin SerPl Elph-Mcnc: 3.6 g/dL (ref 2.9–4.4)
Albumin/Glob SerPl: 1.3 (ref 0.7–1.7)
Alpha 1: 0.2 g/dL (ref 0.0–0.4)
Alpha2 Glob SerPl Elph-Mcnc: 0.8 g/dL (ref 0.4–1.0)
B-Globulin SerPl Elph-Mcnc: 0.8 g/dL (ref 0.7–1.3)
Gamma Glob SerPl Elph-Mcnc: 1 g/dL (ref 0.4–1.8)
Globulin, Total: 2.8 g/dL (ref 2.2–3.9)
IgA: 203 mg/dL (ref 61–437)
IgG (Immunoglobin G), Serum: 1051 mg/dL (ref 603–1613)
IgM (Immunoglobulin M), Srm: 42 mg/dL (ref 15–143)
Total Protein ELP: 6.4 g/dL (ref 6.0–8.5)

## 2020-11-27 LAB — COMPREHENSIVE METABOLIC PANEL
ALT: 17 U/L (ref 0–44)
AST: 23 U/L (ref 15–41)
Albumin: 3.9 g/dL (ref 3.5–5.0)
Alkaline Phosphatase: 62 U/L (ref 38–126)
Anion gap: 5 (ref 5–15)
BUN: 28 mg/dL — ABNORMAL HIGH (ref 8–23)
CO2: 25 mmol/L (ref 22–32)
Calcium: 8.5 mg/dL — ABNORMAL LOW (ref 8.9–10.3)
Chloride: 105 mmol/L (ref 98–111)
Creatinine, Ser: 0.99 mg/dL (ref 0.61–1.24)
GFR, Estimated: >60 mL/min (ref >60)
Glucose, Bld: 78 mg/dL (ref 70–99)
Potassium: 3.6 mmol/L (ref 3.5–5.1)
Sodium: 135 mmol/L (ref 135–145)
Total Bilirubin: 0.8 mg/dL (ref 0.3–1.2)
Total Protein: 7.1 g/dL (ref 6.5–8.1)

## 2020-11-27 LAB — VITAMIN B1: Vitamin B1 (Thiamine): 115.1 nmol/L (ref 66.5–200.0)

## 2020-11-28 ENCOUNTER — Other Ambulatory Visit: Payer: Medicare Other

## 2020-12-04 ENCOUNTER — Telehealth: Payer: Self-pay

## 2020-12-04 NOTE — Telephone Encounter (Signed)
Spoke with patient DPR , and advised of patient will need contact [facility] and schedule with different provider for back injections.

## 2020-12-04 NOTE — Telephone Encounter (Signed)
Tried to reach patient voicemail is full.

## 2020-12-04 NOTE — Telephone Encounter (Signed)
He will need to contact that clinic and request the change. He does not need a referral to do that.

## 2020-12-05 ENCOUNTER — Other Ambulatory Visit: Payer: Self-pay

## 2020-12-05 ENCOUNTER — Inpatient Hospital Stay (HOSPITAL_BASED_OUTPATIENT_CLINIC_OR_DEPARTMENT_OTHER): Payer: Medicare Other | Admitting: Oncology

## 2020-12-05 VITALS — BP 141/94 | HR 69 | Temp 97.5°F | Resp 18 | Wt 162.0 lb

## 2020-12-05 DIAGNOSIS — D649 Anemia, unspecified: Secondary | ICD-10-CM

## 2020-12-05 DIAGNOSIS — R700 Elevated erythrocyte sedimentation rate: Secondary | ICD-10-CM | POA: Diagnosis not present

## 2020-12-05 DIAGNOSIS — K589 Irritable bowel syndrome without diarrhea: Secondary | ICD-10-CM | POA: Diagnosis not present

## 2020-12-05 DIAGNOSIS — I2510 Atherosclerotic heart disease of native coronary artery without angina pectoris: Secondary | ICD-10-CM | POA: Diagnosis not present

## 2020-12-05 DIAGNOSIS — I10 Essential (primary) hypertension: Secondary | ICD-10-CM | POA: Diagnosis not present

## 2020-12-05 DIAGNOSIS — E785 Hyperlipidemia, unspecified: Secondary | ICD-10-CM | POA: Diagnosis not present

## 2020-12-13 NOTE — Progress Notes (Signed)
Hematology/Oncology Consult note [facility]  [phone] [phone]  Patient Care Team: Leone Haven, MD as PCP - General (Family Medicine) Minna Merritts, MD as Consulting Physician (Cardiology)   Name of the patient: Allen David  [id number]  Dec 14, 1927   Date of visit: 12/13/20  Diagnosis- normocytic anemia likely due to chronic disease  Chief complaint/ Reason for visit- discuss results of bloodwork  Heme/Onc history: Patient is a 87 year old male with a past medical history significant for CAD, irritable bowel syndrome hypertension hyperlipidemia among other medical problems.  He has been referred for anemia.  Patient was initially seen by Dr. Vicente Males for consideration of iron deficiency anemia and possible endoscopy.  However on his work-up he was noted to have normal iron studies.  Serum iron was normal at 58 with a low TIBC of 198 and elevated ferritin of 629 more indicative of anemia of chronic disease.  He has therefore been referred to me for further work-up of his anemia.  On looking back at his prior CBCs patient's hemoglobin was closer to 13 in 2017 and slowly started drifting down since then.  In 2018 it was close to 12 in 2020 drifted down to 11 and then more recently on 10/31/2020 he was noted to have a hemoglobin of 9.7.  Around 2020 was also noted to have mild thrombocytopenia with a platelet count fluctuating between 1 30-1 40.  On 10/31/2020 his platelet count was 114.  Results of blood work from 11/23/2020 were as follows: CBC showed white cell count of 4.9, H&H of 10.4/31.8 with an MCV of 91 and a platelet count of 129.  Haptoglobin normal.  Myeloma panel showed no M protein.  ESR elevated at 63.  ANA comprehensive panel showed elevated ENA IgG antibody titer for Aro and LA reticulocyte count normal.  CMP showed normal creatinine and calcium.  Erythropoietin levels mildly elevated at 32.  B1 and B12 levels  normal.  Interval history- Patient currently reports ongoing fatigue and generalized body aches.  ECOG PS- 1 Pain scale- 3   Review of systems- Review of Systems  Constitutional:  Positive for malaise/fatigue. Negative for chills, fever and weight loss.  HENT:  Negative for congestion, ear discharge and nosebleeds.   Eyes:  Negative for blurred vision.  Respiratory:  Negative for cough, hemoptysis, sputum production, shortness of breath and wheezing.   Cardiovascular:  Negative for chest pain, palpitations, orthopnea and claudication.  Gastrointestinal:  Negative for abdominal pain, blood in stool, constipation, diarrhea, heartburn, melena, nausea and vomiting.  Genitourinary:  Negative for dysuria, flank pain, frequency, hematuria and urgency.  Musculoskeletal:  Negative for back pain, joint pain and myalgias.       Generalized bodyaches  Skin:  Negative for rash.  Neurological:  Negative for dizziness, tingling, focal weakness, seizures, weakness and headaches.  Endo/Heme/Allergies:  Does not bruise/bleed easily.  Psychiatric/Behavioral:  Negative for depression and suicidal ideas. The patient does not have insomnia.       Allergies  Allergen Reactions   Lexapro [Escitalopram Oxalate]     hypertension   Tetanus Toxoids Swelling     Past Medical History:  Diagnosis Date   Anxiety    Back injury    with chronic pain   CAD in native artery    Chronic low back pain    Depression    ED (erectile dysfunction)    Elevated PSA    HLD (hyperlipidemia)    HTN (hypertension)  IBS (irritable bowel syndrome)    Malaise and fatigue    Pernicious anemia    Vitamin B deficiency      Past Surgical History:  Procedure Laterality Date   CARPAL TUNNEL RELEASE     EYE SURGERY     VASECTOMY      Social History   Socioeconomic History   Marital status: Single    Spouse name: Not on file   Number of children: Not on file   Years of education: Not on file   Highest  education level: Not on file  Occupational History   Occupation: Retired  Tobacco Use   Smoking status: Former    Types: Cigarettes    Quit date: 05/19/1966    Years since quitting: 54.6   Smokeless tobacco: Former   Tobacco comments:    Quit in [place] Use: Never used  Substance and Sexual Activity   Alcohol use: Yes    Alcohol/week: 0.0 - 1.0 standard drinks    Comment: daily   Drug use: No   Sexual activity: Not Currently  Other Topics Concern   Not on file  Social History Narrative   Lives in [place] alone. Children, 4 girls.      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      Regularly exercise- rides bike and mows lawn. Divorced.   Social Determinants of Health   Financial Resource Strain: Low Risk    Difficulty of Paying Living Expenses: Not hard at all  Food Insecurity: No Food Insecurity   Worried About Charity fundraiser in the Last Year: Never true   [place] in the Last Year: Never true  Transportation Needs: No Transportation Needs   Lack of Transportation (Medical): No   Lack of Transportation (Non-Medical): No  Physical Activity: Not on file  Stress: No Stress Concern Present   Feeling of Stress : Only a little  Social Connections: Not on file  Intimate Partner Violence: Not on file    Family History  Problem Relation Age of Onset   Stroke Mother    Hypertension Mother    Ulcers Mother    Cancer Father        multiple myeloma   Diabetes Sister    Diabetes Brother    Diabetes Brother      Current Outpatient Medications:    ALPRAZolam (XANAX) 1 MG tablet, Take 2 mg by mouth 4 (four) times daily as needed for anxiety or sleep. Patient is taking 1/2 tablet, Disp: , Rfl:    amLODipine (NORVASC) 5 MG tablet, Take 5 mg by mouth daily., Disp: , Rfl:    aspirin 81 MG chewable tablet, Chew 1 tablet by mouth 1 day or 1 dose., Disp: , Rfl:    atorvastatin (LIPITOR) 20 MG tablet,  Take 1 tablet (20 mg total) by mouth daily., Disp: 90 tablet, Rfl: 3   baclofen (LIORESAL) 10 MG tablet, Take 1 tablet (10 mg total) by mouth 3 (three) times daily as needed for muscle spasms., Disp: 30 each, Rfl: 0   bismuth subsalicylate (PEPTO BISMOL) 262 MG/15ML suspension, Take 30 mLs by mouth every 6 (six) hours as needed., Disp: , Rfl:    cyanocobalamin (,VITAMIN B-12,) 1000 MCG/ML injection, Inject 1 mL (1,000 mcg total) into the muscle every 30 (thirty) days., Disp: 12 mL, Rfl: 0   FLUoxetine 60 MG TABS, Take 60 mg  by mouth daily., Disp: 30 tablet, Rfl: 3   pantoprazole (PROTONIX) 40 MG tablet, Take 1 tablet (40 mg total) by mouth daily., Disp: 30 tablet, Rfl: 3   busPIRone (BUSPAR) 5 MG tablet, Take 1 tablet (5 mg total) by mouth 2 (two) times daily. (Patient not taking: Reported on 12/05/2020), Disp: 60 tablet, Rfl: 2   levothyroxine (SYNTHROID, LEVOTHROID) 25 MCG tablet, Take 1 tablet (25 mcg total) by mouth daily before breakfast. (Patient not taking: No sig reported), Disp: , Rfl:    magnesium hydroxide (MILK OF MAGNESIA) 400 MG/5ML suspension, Take by mouth daily as needed for mild constipation. (Patient not taking: Reported on 12/05/2020), Disp: , Rfl:    SYRINGE-NEEDLE, DISP, 3 ML (LUER LOCK SAFETY SYRINGES) 25G X 1" 3 ML MISC, Inject 1 mL into the muscle every 30 (thirty) days. (Patient not taking: Reported on 12/05/2020), Disp: 12 each, Rfl: 0  Current Facility-Administered Medications:    betamethasone acetate-betamethasone sodium phosphate (CELESTONE) injection 3 mg, 3 mg, Intramuscular, Once, Evans, Brent M, DPM   cyanocobalamin ((VITAMIN B-12)) injection 1,000 mcg, 1,000 mcg, Intramuscular, Once, Leone Haven, MD  Physical exam:  Vitals:   12/05/20 1507 12/05/20 1509  BP:  (!) 141/94  Pulse:  69  Resp:  18  Temp:  (!) 97.5 F (36.4 C)  TempSrc:  Tympanic  SpO2:  100%  Weight: 162 lb (73.5 kg) 162 lb (73.5 kg)   Physical Exam Constitutional:      General: He is  not in acute distress. Cardiovascular:     Rate and Rhythm: Normal rate and regular rhythm.     Heart sounds: Normal heart sounds.  Pulmonary:     Effort: Pulmonary effort is normal.     Breath sounds: Normal breath sounds.  Abdominal:     General: Bowel sounds are normal.     Palpations: Abdomen is soft.  Skin:    General: Skin is warm and dry.  Neurological:     Mental Status: He is alert and oriented to person, place, and time.     CMP Latest Ref Rng & Units 11/23/2020  Glucose 70 - 99 mg/dL 78  BUN 8 - 23 mg/dL 28(H)  Creatinine 0.61 - 1.24 mg/dL 0.99  Sodium 135 - 145 mmol/L 135  Potassium 3.5 - 5.1 mmol/L 3.6  Chloride 98 - 111 mmol/L 105  CO2 22 - 32 mmol/L 25  Calcium 8.9 - 10.3 mg/dL 8.5(L)  Total Protein 6.5 - 8.1 g/dL 7.1  Total Bilirubin 0.3 - 1.2 mg/dL 0.8  Alkaline Phos 38 - 126 U/L 62  AST 15 - 41 U/L 23  ALT 0 - 44 U/L 17   CBC Latest Ref Rng & Units 11/23/2020  WBC 4.0 - 10.5 K/uL 4.9  Hemoglobin 13.0 - 17.0 g/dL 10.4(L)  Hematocrit 39.0 - 52.0 % 31.8(L)  Platelets 150 - 400 K/uL 129(L)     Assessment and plan- Patient is a 87 y.o. male referred for normocytic anemia  Based on the results of patient's blood work there is no convincing evidence of iron, B12, B1 deficiency.  No evidence of hemolysis.  Myeloma panel was normal.  He does have an elevated ESR and positive ANA antibody.  Patient complains of fatigue and generalized body aches but no joint inflammation noted.  Given the positive autoimmune serology I would like to refer the patient to rheumatology to see if he has any autoimmune disease that can also explain his anemia and thrombocytopenia.  From a hematology standpoint  I am inclined to monitor his CBC conservatively and see him back in 3 months.  If there is a consistent downward trend in his hemoglobin I will consider a bone marrow biopsy at that time   Visit Diagnosis 1. Normocytic anemia      Dr. Randa Evens, MD, MPH [facility] at [facility] [phone] 12/13/2020 2:32 PM

## 2020-12-28 DIAGNOSIS — M48062 Spinal stenosis, lumbar region with neurogenic claudication: Secondary | ICD-10-CM | POA: Diagnosis not present

## 2020-12-28 DIAGNOSIS — M5416 Radiculopathy, lumbar region: Secondary | ICD-10-CM | POA: Diagnosis not present

## 2020-12-28 DIAGNOSIS — M5136 Other intervertebral disc degeneration, lumbar region: Secondary | ICD-10-CM | POA: Diagnosis not present

## 2021-01-23 DIAGNOSIS — Z79899 Other long term (current) drug therapy: Secondary | ICD-10-CM | POA: Diagnosis not present

## 2021-01-23 DIAGNOSIS — M3505 Sjogren syndrome with inflammatory arthritis: Secondary | ICD-10-CM | POA: Diagnosis not present

## 2021-01-23 DIAGNOSIS — M48062 Spinal stenosis, lumbar region with neurogenic claudication: Secondary | ICD-10-CM | POA: Diagnosis not present

## 2021-01-23 DIAGNOSIS — R700 Elevated erythrocyte sedimentation rate: Secondary | ICD-10-CM | POA: Diagnosis not present

## 2021-01-23 DIAGNOSIS — M5136 Other intervertebral disc degeneration, lumbar region: Secondary | ICD-10-CM | POA: Diagnosis not present

## 2021-01-23 DIAGNOSIS — M5416 Radiculopathy, lumbar region: Secondary | ICD-10-CM | POA: Diagnosis not present

## 2021-01-23 DIAGNOSIS — R5382 Chronic fatigue, unspecified: Secondary | ICD-10-CM | POA: Diagnosis not present

## 2021-01-23 DIAGNOSIS — M11242 Other chondrocalcinosis, left hand: Secondary | ICD-10-CM | POA: Diagnosis not present

## 2021-01-23 DIAGNOSIS — R768 Other specified abnormal immunological findings in serum: Secondary | ICD-10-CM | POA: Diagnosis not present

## 2021-01-23 DIAGNOSIS — R0609 Other forms of dyspnea: Secondary | ICD-10-CM | POA: Diagnosis not present

## 2021-01-23 DIAGNOSIS — M7989 Other specified soft tissue disorders: Secondary | ICD-10-CM | POA: Diagnosis not present

## 2021-01-23 DIAGNOSIS — M79641 Pain in right hand: Secondary | ICD-10-CM | POA: Diagnosis not present

## 2021-02-06 DIAGNOSIS — M5416 Radiculopathy, lumbar region: Secondary | ICD-10-CM | POA: Diagnosis not present

## 2021-02-06 DIAGNOSIS — M48062 Spinal stenosis, lumbar region with neurogenic claudication: Secondary | ICD-10-CM | POA: Diagnosis not present

## 2021-02-11 ENCOUNTER — Telehealth: Payer: Self-pay | Admitting: Family Medicine

## 2021-02-11 NOTE — Telephone Encounter (Signed)
Copied from [place] [id number]. Topic: Medicare AWV >> Feb 11, 2021  2:45 PM Harris-Coley, Hannah Beat wrote: Reason for CRM: Left message for patient to reschedule Annual Wellness Visit.  Please schedule with Nurse Health Advisor Denisa O'Brien-Blaney, LPN at [facility].  Please call [phone] ask for [facility]

## 2021-02-21 ENCOUNTER — Ambulatory Visit: Payer: Medicare Other

## 2021-02-22 ENCOUNTER — Ambulatory Visit: Payer: Medicare Other

## 2021-02-27 DIAGNOSIS — M47816 Spondylosis without myelopathy or radiculopathy, lumbar region: Secondary | ICD-10-CM | POA: Diagnosis not present

## 2021-02-27 DIAGNOSIS — M48062 Spinal stenosis, lumbar region with neurogenic claudication: Secondary | ICD-10-CM | POA: Diagnosis not present

## 2021-02-27 DIAGNOSIS — M5136 Other intervertebral disc degeneration, lumbar region: Secondary | ICD-10-CM | POA: Diagnosis not present

## 2021-02-27 DIAGNOSIS — M5416 Radiculopathy, lumbar region: Secondary | ICD-10-CM | POA: Diagnosis not present

## 2021-03-06 ENCOUNTER — Ambulatory Visit: Payer: Medicare Other | Admitting: Oncology

## 2021-03-06 ENCOUNTER — Other Ambulatory Visit: Payer: Medicare Other

## 2021-03-08 DIAGNOSIS — R262 Difficulty in walking, not elsewhere classified: Secondary | ICD-10-CM | POA: Diagnosis not present

## 2021-03-08 DIAGNOSIS — M6281 Muscle weakness (generalized): Secondary | ICD-10-CM | POA: Diagnosis not present

## 2021-03-08 DIAGNOSIS — M545 Low back pain, unspecified: Secondary | ICD-10-CM | POA: Diagnosis not present

## 2021-03-08 DIAGNOSIS — M799 Soft tissue disorder, unspecified: Secondary | ICD-10-CM | POA: Diagnosis not present

## 2021-03-12 ENCOUNTER — Other Ambulatory Visit: Payer: Self-pay

## 2021-03-12 DIAGNOSIS — D649 Anemia, unspecified: Secondary | ICD-10-CM

## 2021-03-13 ENCOUNTER — Telehealth: Payer: Self-pay | Admitting: Oncology

## 2021-03-13 ENCOUNTER — Inpatient Hospital Stay: Payer: Medicare Other | Attending: Oncology

## 2021-03-13 ENCOUNTER — Inpatient Hospital Stay: Payer: Medicare Other | Admitting: Oncology

## 2021-03-13 ENCOUNTER — Other Ambulatory Visit: Payer: Self-pay

## 2021-03-13 DIAGNOSIS — D509 Iron deficiency anemia, unspecified: Secondary | ICD-10-CM | POA: Insufficient documentation

## 2021-03-13 DIAGNOSIS — D649 Anemia, unspecified: Secondary | ICD-10-CM

## 2021-03-13 LAB — CBC WITH DIFFERENTIAL/PLATELET
Abs Immature Granulocytes: 0.03 10*3/uL (ref 0.00–0.07)
Basophils Absolute: 0 10*3/uL (ref 0.0–0.1)
Basophils Relative: 1 %
Eosinophils Absolute: 0 10*3/uL (ref 0.0–0.5)
Eosinophils Relative: 0 %
HCT: 35 % — ABNORMAL LOW (ref 39.0–52.0)
Hemoglobin: 11.6 g/dL — ABNORMAL LOW (ref 13.0–17.0)
Immature Granulocytes: 1 %
Lymphocytes Relative: 23 %
Lymphs Abs: 1.4 10*3/uL (ref 0.7–4.0)
MCH: 30.4 pg (ref 26.0–34.0)
MCHC: 33.1 g/dL (ref 30.0–36.0)
MCV: 91.9 fL (ref 80.0–100.0)
Monocytes Absolute: 0.5 10*3/uL (ref 0.1–1.0)
Monocytes Relative: 9 %
Neutro Abs: 4 10*3/uL (ref 1.7–7.7)
Neutrophils Relative %: 66 %
Platelets: 127 10*3/uL — ABNORMAL LOW (ref 150–400)
RBC: 3.81 MIL/uL — ABNORMAL LOW (ref 4.22–5.81)
RDW: 15.3 % (ref 11.5–15.5)
WBC: 6 10*3/uL (ref 4.0–10.5)
nRBC: 0 % (ref 0.0–0.2)

## 2021-03-13 NOTE — Telephone Encounter (Signed)
Patient left after Lab work today. Made multiple attempts to reach patient by phone with no answer. Message left on home phone to please call back to confirm new appointment time.

## 2021-03-18 ENCOUNTER — Inpatient Hospital Stay: Payer: Medicare Other | Admitting: Oncology

## 2021-04-10 ENCOUNTER — Other Ambulatory Visit: Payer: Self-pay

## 2021-04-10 ENCOUNTER — Encounter: Payer: Self-pay | Admitting: Family

## 2021-04-10 ENCOUNTER — Ambulatory Visit: Payer: Medicare Other

## 2021-04-10 ENCOUNTER — Ambulatory Visit (INDEPENDENT_AMBULATORY_CARE_PROVIDER_SITE_OTHER): Payer: Medicare Other | Admitting: Family

## 2021-04-10 VITALS — BP 164/80 | HR 61 | Temp 97.5°F | Resp 16 | Ht 72.0 in | Wt 169.1 lb

## 2021-04-10 DIAGNOSIS — R454 Irritability and anger: Secondary | ICD-10-CM

## 2021-04-10 DIAGNOSIS — E038 Other specified hypothyroidism: Secondary | ICD-10-CM | POA: Diagnosis not present

## 2021-04-10 DIAGNOSIS — Z79899 Other long term (current) drug therapy: Secondary | ICD-10-CM | POA: Diagnosis not present

## 2021-04-10 DIAGNOSIS — F419 Anxiety disorder, unspecified: Secondary | ICD-10-CM

## 2021-04-10 LAB — TSH: TSH: 4.47 u[IU]/mL (ref 0.35–5.50)

## 2021-04-10 MED ORDER — FLUOXETINE HCL 20 MG PO TABS: 40.0000 mg | ORAL_TABLET | Freq: Every day | ORAL | 0 refills | Status: DC

## 2021-04-10 NOTE — Progress Notes (Signed)
Acute Office Visit  Subjective:    Patient ID: Allen David, male    DOB: Jan 17, 1928, 87 y.o.   MRN: [id number]  Chief Complaint  Patient presents with  . Anxiety    HPI Patient is in today with concerns of anxiety, increased frustration. He is being seen by the [facility] Psychiatrist who is responsible for management of psychiatric medications. He is very confused about what medications he should/should not be taking. Reports the psychiatrist does not want him taking Xanax and stopped prescribing it. He complained to upper administration and the have agreed to titrate him down to .5 mg of xanax three times a day. He has been taking more because this doses does sustain his feelings of anxiousness. He also reports being on 40 mg of Prozac instead of 60 mg (also adjusted by psychiatry). Patient is angry and wants Dr. Caryl Bis to increase his Xanax back to 1 mg. He has a history of hypothyroidism and is not taking any medications for it in several months because he was considered "borderline."   Patient is concerned and stressed by his grandson who lives in the home who has mental illness. Per patient, he has allegedly been involved in drug and gun activity, and reports coming in and out of the house all times of the night which disrupts his sleep.   Past Medical History:  Diagnosis Date  . Anxiety   . Back injury    with chronic pain  . CAD in native artery   . Chronic low back pain   . Depression   . ED (erectile dysfunction)   . Elevated PSA   . HLD (hyperlipidemia)   . HTN (hypertension)   . IBS (irritable bowel syndrome)   . Malaise and fatigue   . Pernicious anemia   . Vitamin B deficiency     Past Surgical History:  Procedure Laterality Date  . CARPAL TUNNEL RELEASE    . EYE SURGERY    . VASECTOMY      Family History  Problem Relation Age of Onset  . Stroke Mother   . Hypertension Mother   . Ulcers Mother   . Cancer Father        multiple myeloma  . Diabetes Sister    . Diabetes Brother   . Diabetes Brother     Social History   Socioeconomic History  . Marital status: Single    Spouse name: Not on file  . Number of children: Not on file  . Years of education: Not on file  . Highest education level: Not on file  Occupational History  . Occupation: Retired  Tobacco Use  . Smoking status: Former    Types: Cigarettes    Quit date: 05/19/1966    Years since quitting: 54.9  . Smokeless tobacco: Former  . Tobacco comments:    Quit in [place] Use  . Vaping Use: Never used  Substance and Sexual Activity  . Alcohol use: Yes    Alcohol/week: 0.0 - 1.0 standard drinks    Comment: daily  . Drug use: No  . Sexual activity: Not Currently  Other Topics Concern  . Not on file  Social History Narrative   Lives in [place] alone. Children, 4 girls.      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      Regularly exercise- rides bike and mows lawn. Divorced.   Social Determinants of Health  Financial Resource Strain: Not on file  Food Insecurity: Not on file  Transportation Needs: Not on file  Physical Activity: Not on file  Stress: Not on file  Social Connections: Not on file  Intimate Partner Violence: Not on file    Outpatient Medications Prior to Visit  Medication Sig Dispense Refill  . ALPRAZolam (XANAX) 0.5 MG tablet TAKE ONE TABLET BY MOUTH THREE TIMES A DAY FOR ANXIETY.  TAKE ONE-HALF TABLET AT BEDTIME IF ABLE    . amLODipine (NORVASC) 5 MG tablet Take 5 mg by mouth daily.    Marland Kitchen aspirin 81 MG chewable tablet Chew 1 tablet by mouth 1 day or 1 dose.    Marland Kitchen atorvastatin (LIPITOR) 20 MG tablet Take 1 tablet (20 mg total) by mouth daily. 90 tablet 3  . bismuth subsalicylate (PEPTO BISMOL) 262 MG/15ML suspension Take 30 mLs by mouth every 6 (six) hours as needed.    . cyanocobalamin (,VITAMIN B-12,) 1000 MCG/ML injection Inject 1 mL (1,000 mcg total) into the muscle every 30 (thirty) days. 12 mL  0  . hydroxychloroquine (PLAQUENIL) 200 MG tablet Take 1 tablet by mouth 2 (two) times daily.    . magnesium hydroxide (MILK OF MAGNESIA) 400 MG/5ML suspension Take by mouth daily as needed for mild constipation. (Patient not taking: Reported on 12/05/2020)    . pantoprazole (PROTONIX) 40 MG tablet Take 1 tablet (40 mg total) by mouth daily. 30 tablet 3  . SYRINGE-NEEDLE, DISP, 3 ML (LUER LOCK SAFETY SYRINGES) 25G X 1" 3 ML MISC Inject 1 mL into the muscle every 30 (thirty) days. (Patient not taking: Reported on 12/05/2020) 12 each 0  . ALPRAZolam (XANAX) 1 MG tablet Take 2 mg by mouth 4 (four) times daily as needed for anxiety or sleep. Patient is taking 1/2 tablet    . baclofen (LIORESAL) 10 MG tablet Take 1 tablet (10 mg total) by mouth 3 (three) times daily as needed for muscle spasms. 30 each 0  . busPIRone (BUSPAR) 10 MG tablet Take 0.5 tablets by mouth 2 (two) times daily.    . busPIRone (BUSPAR) 5 MG tablet Take 1 tablet (5 mg total) by mouth 2 (two) times daily. (Patient not taking: Reported on 12/05/2020) 60 tablet 2  . FLUoxetine 60 MG TABS Take 60 mg by mouth daily. 30 tablet 3  . levothyroxine (SYNTHROID, LEVOTHROID) 25 MCG tablet Take 1 tablet (25 mcg total) by mouth daily before breakfast. (Patient not taking: No sig reported)    . predniSONE (DELTASONE) 5 MG tablet Take by mouth.     Facility-Administered Medications Prior to Visit  Medication Dose Route Frequency Provider Last Rate Last Admin  . cyanocobalamin ((VITAMIN B-12)) injection 1,000 mcg  1,000 mcg Intramuscular Once Leone Haven, MD      . betamethasone acetate-betamethasone sodium phosphate (CELESTONE) injection 3 mg  3 mg Intramuscular Once Edrick Kins, DPM        Allergies  Allergen Reactions  . Lexapro [Escitalopram Oxalate]     hypertension  . Tetanus Toxoids Swelling    Review of Systems  Psychiatric/Behavioral:  Positive for agitation and sleep disturbance. Negative for self-injury and suicidal  ideas. The patient is nervous/anxious.   All other systems reviewed and are negative.     Objective:    Physical Exam Vitals and nursing note reviewed.  Constitutional:      Appearance: Normal appearance.  Cardiovascular:     Rate and Rhythm: Normal rate and regular rhythm.     Pulses:  Normal pulses.     Heart sounds: Normal heart sounds.  Pulmonary:     Effort: Pulmonary effort is normal.     Breath sounds: Normal breath sounds.  Abdominal:     General: Abdomen is flat.     Palpations: Abdomen is soft.  Musculoskeletal:        General: Normal range of motion.     Cervical back: Normal range of motion and neck supple.  Skin:    General: Skin is warm and dry.  Neurological:     General: No focal deficit present.     Mental Status: He is alert and oriented to person, place, and time.  Psychiatric:        Mood and Affect: Mood normal.        Behavior: Behavior normal.   BP (!) 164/80   Pulse 61   Temp (!) 97.5 F (36.4 C) (Oral)   Resp 16   Ht 6' (1.829 m)   Wt 169 lb 2 oz (76.7 kg)   SpO2 99%   BMI 22.94 kg/m  Wt Readings from Last 3 Encounters:  04/10/21 169 lb 2 oz (76.7 kg)  12/05/20 162 lb (73.5 kg)  11/23/20 162 lb (73.5 kg)    Health Maintenance Due  Topic Date Due  . Zoster Vaccines- Shingrix (2 of 2) 07/12/2019  . COVID-19 Vaccine (3 - Booster for Moderna series) 09/22/2019    There are no preventive care reminders to display for this patient.   Lab Results  Component Value Date   TSH 5.01 (H) 11/09/2020   Lab Results  Component Value Date   WBC 6.0 03/13/2021   HGB 11.6 (L) 03/13/2021   HCT 35.0 (L) 03/13/2021   MCV 91.9 03/13/2021   PLT 127 (L) 03/13/2021   Lab Results  Component Value Date   NA 135 11/23/2020   K 3.6 11/23/2020   CO2 25 11/23/2020   GLUCOSE 78 11/23/2020   BUN 28 (H) 11/23/2020   CREATININE 0.99 11/23/2020   BILITOT 0.8 11/23/2020   ALKPHOS 62 11/23/2020   AST 23 11/23/2020   ALT 17 11/23/2020   PROT 7.1  11/23/2020   ALBUMIN 3.9 11/23/2020   CALCIUM 8.5 (L) 11/23/2020   ANIONGAP 5 11/23/2020   GFR 73.52 11/16/2019   Lab Results  Component Value Date   CHOL 113 10/18/2019   Lab Results  Component Value Date   HDL 32.10 (L) 10/18/2019   Lab Results  Component Value Date   LDLCALC 62 10/18/2019   Lab Results  Component Value Date   TRIG 95.0 10/18/2019   Lab Results  Component Value Date   CHOLHDL 4 10/18/2019   No results found for: HGBA1C     Assessment & Plan:   Problem List Items Addressed This Visit     Hypothyroidism   Relevant Orders   TSH   Other Visit Diagnoses     Anxiety    -  Primary   Relevant Medications   FLUoxetine (PROZAC) 20 MG tablet   Other Relevant Orders   Ambulatory referral to Home Health   TSH   Irritability       Medication management            Meds ordered this encounter  Medications  . FLUoxetine (PROZAC) 20 MG tablet    Sig: Take 2 tablets (40 mg total) by mouth daily.    Dispense:  180 tablet    Refill:  0   L[place]ty was seen today  for anxiety.  Diagnoses and all orders for this visit:  Anxiety -     Ambulatory referral to [facility] -     TSH  Other specified hypothyroidism -     TSH  Irritability  Medication management  Other orders -     FLUoxetine (PROZAC) 20 MG tablet; Take 2 tablets (40 mg total) by mouth daily.   Spoke with Dr. Caryl Bis who agrees that one provider needs to manage psychiatric medication. Spoke to patient to advise him that psychiatry will have to manage his medications. I will also have home health confirm that he is taking his medication as prescribed. He appears to be confused about what he should/should not be taken and what has/has not been discontinued.  TSH obtained to be sure his increased anxiety is not related to thyroid dysfunction. At some point, he may need a Education officer, museum evaluation to access his living conditions.   Kennyth Arnold, FNP

## 2021-04-15 ENCOUNTER — Telehealth: Payer: Self-pay | Admitting: *Deleted

## 2021-04-15 NOTE — Telephone Encounter (Signed)
-----   Message from Allen David, Allen David sent at 04/14/2021  5:18 PM EST ----- TSH normal. Be sure to follow-up with psychiatry at the [facility] for medication management for anxiety

## 2021-04-15 NOTE — Telephone Encounter (Signed)
Left message to call office

## 2021-04-15 NOTE — Telephone Encounter (Signed)
Pt called in stating he is returning a call. Pt requesting callback

## 2021-04-16 NOTE — Telephone Encounter (Signed)
See result note.  

## 2021-04-25 ENCOUNTER — Telehealth: Payer: Self-pay | Admitting: Family Medicine

## 2021-04-25 NOTE — Telephone Encounter (Signed)
Patient daughter to  called need last visit notes for father appointment. What the Dr order for patient ( some kind of home care )  Please call  daughter at [phone]

## 2021-04-29 ENCOUNTER — Telehealth: Payer: Self-pay | Admitting: Family Medicine

## 2021-04-29 NOTE — Telephone Encounter (Signed)
LVM for patients daughter  to call back.  Jayla Mackie,cma  

## 2021-04-29 NOTE — Telephone Encounter (Signed)
Patient's daughter is calling and states her father's FLUoxetine (PROZAC) 20 MG tablet is not working. [facility]. Malyah Ohlrich,cma

## 2021-04-29 NOTE — Telephone Encounter (Signed)
Noted. I think there are too many people involved with managing his psychiatric medications. I am going to defer any additional changes to his psychiatrist at the [place]. I would suggest they call them to see if he can be seen sooner or to discuss medication changes.

## 2021-04-29 NOTE — Telephone Encounter (Signed)
Patient's daughter is calling and states her father's FLUoxetine (PROZAC) 20 MG tablet is not working. [facility] until [facility] end of [facility]. Please call [name] on cell phone [phone].

## 2021-04-30 NOTE — Telephone Encounter (Signed)
Patients daughter called back and I relayed the message to her about his medication and she needed to call the [place] about it. She understood.  Airel Magadan,cma

## 2021-06-16 ENCOUNTER — Emergency Department
Admission: EM | Admit: 2021-06-16 | Discharge: 2021-06-17 | Disposition: A | Discharge status: Home / Self Care | Payer: No Typology Code available for payment source | Attending: Emergency Medicine | Admitting: Emergency Medicine

## 2021-06-16 DIAGNOSIS — Z7982 Long term (current) use of aspirin: Secondary | ICD-10-CM | POA: Insufficient documentation

## 2021-06-16 DIAGNOSIS — M545 Low back pain, unspecified: Secondary | ICD-10-CM | POA: Diagnosis not present

## 2021-06-16 DIAGNOSIS — G8929 Other chronic pain: Secondary | ICD-10-CM | POA: Insufficient documentation

## 2021-06-16 DIAGNOSIS — Z79899 Other long term (current) drug therapy: Secondary | ICD-10-CM | POA: Insufficient documentation

## 2021-06-16 DIAGNOSIS — M5126 Other intervertebral disc displacement, lumbar region: Secondary | ICD-10-CM | POA: Diagnosis not present

## 2021-06-16 DIAGNOSIS — R410 Disorientation, unspecified: Secondary | ICD-10-CM | POA: Diagnosis not present

## 2021-06-16 DIAGNOSIS — Z20822 Contact with and (suspected) exposure to covid-19: Secondary | ICD-10-CM | POA: Diagnosis not present

## 2021-06-16 DIAGNOSIS — I10 Essential (primary) hypertension: Secondary | ICD-10-CM | POA: Diagnosis not present

## 2021-06-16 DIAGNOSIS — F419 Anxiety disorder, unspecified: Secondary | ICD-10-CM | POA: Insufficient documentation

## 2021-06-16 DIAGNOSIS — R001 Bradycardia, unspecified: Secondary | ICD-10-CM | POA: Insufficient documentation

## 2021-06-16 DIAGNOSIS — R0602 Shortness of breath: Secondary | ICD-10-CM | POA: Diagnosis not present

## 2021-06-16 DIAGNOSIS — I2510 Atherosclerotic heart disease of native coronary artery without angina pectoris: Secondary | ICD-10-CM | POA: Diagnosis not present

## 2021-06-16 LAB — COMPREHENSIVE METABOLIC PANEL
ALT: 14 U/L (ref 0–44)
AST: 29 U/L (ref 15–41)
Albumin: 3.5 g/dL (ref 3.5–5.0)
Alkaline Phosphatase: 61 U/L (ref 38–126)
Anion gap: 10 (ref 5–15)
BUN: 21 mg/dL (ref 8–23)
CO2: 22 mmol/L (ref 22–32)
Calcium: 8.8 mg/dL — ABNORMAL LOW (ref 8.9–10.3)
Chloride: 108 mmol/L (ref 98–111)
Creatinine, Ser: 1 mg/dL (ref 0.61–1.24)
GFR, Estimated: >60 mL/min (ref >60)
Glucose, Bld: 91 mg/dL (ref 70–99)
Potassium: 3.7 mmol/L (ref 3.5–5.1)
Sodium: 140 mmol/L (ref 135–145)
Total Bilirubin: 0.4 mg/dL (ref 0.3–1.2)
Total Protein: 6.4 g/dL — ABNORMAL LOW (ref 6.5–8.1)

## 2021-06-16 LAB — URINALYSIS, ROUTINE W REFLEX MICROSCOPIC
Glucose, UA: NEGATIVE mg/dL
Hgb urine dipstick: NEGATIVE
Ketones, ur: NEGATIVE mg/dL
Leukocytes,Ua: NEGATIVE
Nitrite: NEGATIVE
Protein, ur: 30 mg/dL — AB
Specific Gravity, Urine: 1.025 (ref 1.005–1.030)
pH: 5 (ref 5.0–8.0)

## 2021-06-16 LAB — CBC
HCT: 31.3 % — ABNORMAL LOW (ref 39.0–52.0)
Hemoglobin: 10.3 g/dL — ABNORMAL LOW (ref 13.0–17.0)
MCH: 30.4 pg (ref 26.0–34.0)
MCHC: 32.9 g/dL (ref 30.0–36.0)
MCV: 92.3 fL (ref 80.0–100.0)
Platelets: 126 10*3/uL — ABNORMAL LOW (ref 150–400)
RBC: 3.39 MIL/uL — ABNORMAL LOW (ref 4.22–5.81)
RDW: 14.7 % (ref 11.5–15.5)
WBC: 4.1 10*3/uL (ref 4.0–10.5)
nRBC: 0 % (ref 0.0–0.2)

## 2021-06-16 LAB — URINALYSIS, MICROSCOPIC (REFLEX)

## 2021-06-16 MED ORDER — ACETAMINOPHEN 325 MG PO TABS
650.0000 mg | ORAL_TABLET | Freq: Once | ORAL | Status: AC | PRN
  Administered 2021-06-16: 650 mg via ORAL
  Filled 2021-06-16: qty 2

## 2021-06-16 NOTE — ED Triage Notes (Signed)
Pt brought from home for anxiety and increased confusion. Hx of UTI. Pt is confused to time and endorses feeling anxious. No focal weakness or any pain. Pt takes xanax chronically and took some 2 hours ago. Last year they reduced his xanax medication. Pt not taking all his meds as prescribed.

## 2021-06-16 NOTE — ED Notes (Signed)
Pt c/o back spasm and requesting tylenol. VSS at this time.

## 2021-06-17 ENCOUNTER — Emergency Department: Payer: No Typology Code available for payment source

## 2021-06-17 DIAGNOSIS — M5126 Other intervertebral disc displacement, lumbar region: Secondary | ICD-10-CM | POA: Diagnosis not present

## 2021-06-17 DIAGNOSIS — R410 Disorientation, unspecified: Secondary | ICD-10-CM | POA: Diagnosis not present

## 2021-06-17 DIAGNOSIS — R0602 Shortness of breath: Secondary | ICD-10-CM | POA: Diagnosis not present

## 2021-06-17 LAB — MAGNESIUM: Magnesium: 1.9 mg/dL (ref 1.7–2.4)

## 2021-06-17 LAB — RESP PANEL BY RT-PCR (FLU A&B, COVID) ARPGX2
Influenza A by PCR: NEGATIVE
Influenza B by PCR: NEGATIVE
SARS Coronavirus 2 by RT PCR: NEGATIVE

## 2021-06-17 LAB — TROPONIN I (HIGH SENSITIVITY): Troponin I (High Sensitivity): 16 ng/L (ref <18)

## 2021-06-17 MED ORDER — FENTANYL CITRATE PF 50 MCG/ML IJ SOSY
50.0000 ug | PREFILLED_SYRINGE | Freq: Once | INTRAMUSCULAR | Status: DC
  Filled 2021-06-17: qty 1

## 2021-06-17 MED ORDER — LACTATED RINGERS IV BOLUS
500.0000 mL | Freq: Once | INTRAVENOUS | Status: AC
  Administered 2021-06-17: 500 mL via INTRAVENOUS

## 2021-06-17 MED ORDER — ONDANSETRON HCL 4 MG/2ML IJ SOLN
4.0000 mg | Freq: Once | INTRAMUSCULAR | Status: DC
  Filled 2021-06-17: qty 2

## 2021-06-17 NOTE — ED Provider Notes (Signed)
[facility] Provider Note    Event Date/Time   First MD Initiated Contact with Patient 06/16/21 2354     (approximate)   History   Anxiety and Altered Mental Status   HPI  Allen David is a 88 y.o. male with history of Sjogren's, CAD, hypertension, hyperlipidemia, anxiety, IBS who presents to the emergency department with his daughter with multiple complaints.  Unfortunately patient and daughter are both very poor historians.  Daughter reports that the patient has been confused and more agitated intermittently over the past week.  She was concerned that his urine appeared darker than normal and thought he could have a UTI or be dehydrated.  She is not aware of any recent fevers, cough, vomiting or diarrhea.  States he did have a fall about 3 to 4 weeks ago but no other recent traumatic injury.  She states they have been trying to adjust his medications at the [facility] for some time.  She states that they were hoping to get him into see a geriatric psychologist.  No SI, HI or hallucinations.  She also reports that the patient has history of chronic low back pain and ambulates with a shuffling gait while bent over at the waist.  She states he is supposed to use a cane but does not use it always.  He does not have a walker.  She denies that he is complained of any chest pain but she states she has thought he has been short of breath.  No cough.  No new medications.  She states over the past few days she has had a hard time getting him to take his medications.  Daughter reports that his lower back pain is worse than normal but is unable to tell me how long this has been bothering him more.  He is not taking any pain medication at home.  He denies any numbness, tingling or incontinence.  Is followed by pain management and has had previous epidural injections.  Patient lives at home with his daughter.   History provided by patient and daughter.    Past Medical History:   Diagnosis Date   Anxiety    Back injury    with chronic pain   CAD in native artery    Chronic low back pain    Depression    ED (erectile dysfunction)    Elevated PSA    HLD (hyperlipidemia)    HTN (hypertension)    IBS (irritable bowel syndrome)    Malaise and fatigue    Pernicious anemia    Vitamin B deficiency     Past Surgical History:  Procedure Laterality Date   CARPAL TUNNEL RELEASE     EYE SURGERY     VASECTOMY      MEDICATIONS:  Prior to Admission medications   Medication Sig Start Date End Date Taking? Authorizing Provider  ALPRAZolam (XANAX) 0.5 MG tablet TAKE ONE TABLET BY MOUTH THREE TIMES A DAY FOR ANXIETY.  TAKE ONE-HALF TABLET AT BEDTIME IF ABLE 05/03/21  Yes [provider]  amLODipine (NORVASC) 5 MG tablet Take 5 mg by mouth daily.   Yes [provider]  aspirin 81 MG chewable tablet Chew 1 tablet by mouth 1 day or 1 dose. 08/16/10  Yes [provider]  atorvastatin (LIPITOR) 20 MG tablet Take 1 tablet (20 mg total) by mouth daily. 01/05/18  Yes Gollan, Kathlene November, MD  cyanocobalamin (,VITAMIN B-12,) 1000 MCG/ML injection Inject 1 mL (1,000 mcg total)  into the muscle every 30 (thirty) days. 11/09/20  Yes Leone Haven, MD  FLUoxetine (PROZAC) 20 MG capsule TAKE TWO CAPSULES BY MOUTH IN THE MORNING FOR ANXIETY 05/03/21  Yes [provider]  hydroxychloroquine (PLAQUENIL) 200 MG tablet Take 1 tablet by mouth 2 (two) times daily. 01/23/21  Yes [provider]  pantoprazole (PROTONIX) 40 MG tablet Take 1 tablet (40 mg total) by mouth daily. 11/09/20 11/09/21 Yes Leone Haven, MD  bismuth subsalicylate (PEPTO BISMOL) 262 MG/15ML suspension Take 30 mLs by mouth every 6 (six) hours as needed.    [provider]  magnesium hydroxide (MILK OF MAGNESIA) 400 MG/5ML suspension Take by mouth daily as needed for mild constipation. Patient not taking: Reported on 12/05/2020    [provider]  SYRINGE-NEEDLE,  DISP, 3 ML (LUER LOCK SAFETY SYRINGES) 25G X 1" 3 ML MISC Inject 1 mL into the muscle every 30 (thirty) days. Patient not taking: Reported on 12/05/2020 11/09/20   Leone Haven, MD    Physical Exam   Triage Vital Signs: ED Triage Vitals  Enc Vitals Group     BP 06/16/21 2126 (!) 130/94     Pulse Rate 06/16/21 2126 71     Resp 06/16/21 2126 16     Temp 06/16/21 2126 97.8 F (36.6 C)     Temp Source 06/16/21 2126 Oral     SpO2 06/16/21 2126 100 %     Weight --      Height --      Head Circumference --      Peak Flow --      Pain Score 06/16/21 2136 0     Pain Loc --      Pain Edu? --      Excl. in [place]? --     Most recent vital signs: Vitals:   06/17/21 0300 06/17/21 0339  BP:  (!) 152/73  Pulse: (!) 53 (!) 54  Resp: 19 18  Temp:    SpO2: 98% 96%    CONSTITUTIONAL: Alert and oriented x 3 and responds appropriately to questions.  Elderly.  In no distress.  Perseverating on different topics including his history of IBS and his interactions with his primary care doctor. HEAD: Normocephalic, atraumatic EYES: Conjunctivae clear, pupils appear equal, sclera nonicteric ENT: normal nose; moist mucous membranes NECK: Supple, normal ROM CARD: RRR; S1 and S2 appreciated; no murmurs, no clicks, no rubs, no gallops RESP: Normal chest excursion without splinting or tachypnea; breath sounds clear and equal bilaterally; no wheezes, no rhonchi, no rales, no hypoxia or respiratory distress, speaking full sentences ABD/GI: Normal bowel sounds; non-distended; soft, non-tender, no rebound, no guarding, no peritoneal signs BACK: The back appears normal, no midline spinal tenderness or step-off or deformity EXT: Normal ROM in all joints; no deformity noted, no edema; no cyanosis, no calf tenderness or calf swelling SKIN: Normal color for age and race; warm; no rash on exposed skin NEURO: Moves all extremities equally, normal speech, reports normal sensation diffusely, ambulates with shuffling  gait which is baseline per daughter, cranial nerves II through XII intact PSYCH: The patient's mood and manner are appropriate.   ED Results / Procedures / Treatments   LABS: (all labs ordered are listed, but only abnormal results are displayed) Labs Reviewed  COMPREHENSIVE METABOLIC PANEL - Abnormal; Notable for the following components:      Result Value   Calcium 8.8 (*)    Total Protein 6.4 (*)    All other  components within normal limits  CBC - Abnormal; Notable for the following components:   RBC 3.39 (*)    Hemoglobin 10.3 (*)    HCT 31.3 (*)    Platelets 126 (*)    All other components within normal limits  URINALYSIS, ROUTINE W REFLEX MICROSCOPIC - Abnormal; Notable for the following components:   Bilirubin Urine SMALL (*)    Protein, ur 30 (*)    All other components within normal limits  URINALYSIS, MICROSCOPIC (REFLEX) - Abnormal; Notable for the following components:   Bacteria, UA RARE (*)    All other components within normal limits  RESP PANEL BY RT-PCR (FLU A&B, COVID) ARPGX2  MAGNESIUM  TROPONIN I (HIGH SENSITIVITY)     EKG:  EKG Interpretation  Date/Time:  Monday June 17 2021 03:13:17 EST Ventricular Rate:  47 PR Interval:  302 QRS Duration: 109 QT Interval:  518 QTC Calculation: 458 R Axis:   -35 Text Interpretation: Sinus bradycardia Prolonged PR interval Left axis deviation Posterior infarct, old Confirmed by Pryor Curia [phone]) on 06/17/2021 4:23:50 AM         RADIOLOGY: My personal review and interpretation of imaging: Chest x-ray clear.  Head CT shows no acute abnormality.  MRI of the lumbar spine shows severe lower lumbar foraminal stenosis on the left.  I have personally reviewed all radiology reports.   DG Chest 2 View  Result Date: 06/17/2021 CLINICAL DATA:  Shortness of breath EXAM: CHEST - 2 VIEW COMPARISON:  04/25/2019 FINDINGS: The heart size and mediastinal contours are within normal limits. Both lungs are clear. The  visualized skeletal structures are unremarkable. IMPRESSION: No active cardiopulmonary disease. Electronically Signed   By: Inez Catalina M.D.   On: 06/17/2021 00:39   CT HEAD WO CONTRAST (5MM)  Result Date: 06/17/2021 CLINICAL DATA:  Anxiety, confusion EXAM: CT HEAD WITHOUT CONTRAST TECHNIQUE: Contiguous axial images were obtained from the base of the skull through the vertex without intravenous contrast. RADIATION DOSE REDUCTION: This exam was performed according to the departmental dose-optimization program which includes automated exposure control, adjustment of the mA and/or kV according to patient size and/or use of iterative reconstruction technique. COMPARISON:  07/10/2015 FINDINGS: Brain: No evidence of acute infarction, hemorrhage, hydrocephalus, extra-axial collection or mass lesion/mass effect. Global cortical atrophy. Subcortical white matter and periventricular small vessel ischemic changes. Vascular: Intracranial atherosclerosis. Skull: Normal. Negative for fracture or focal lesion. Sinuses/Orbits: The visualized paranasal sinuses are essentially clear. The mastoid air cells are unopacified. Other: None. IMPRESSION: No evidence of acute intracranial abnormality. Atrophy with small vessel ischemic changes. Electronically Signed   By: Julian Hy M.D.   On: 06/17/2021 00:34   MR LUMBAR SPINE WO CONTRAST  Result Date: 06/17/2021 CLINICAL DATA:  Low back pain for greater than 6 weeks EXAM: MRI LUMBAR SPINE WITHOUT CONTRAST TECHNIQUE: Multiplanar, multisequence MR imaging of the lumbar spine was performed. No intravenous contrast was administered. COMPARISON:  01/30/2019 FINDINGS: Segmentation:  Standard. Alignment:  Physiologic. Vertebrae: No fracture, evidence of discitis, or bone lesion. Diffusely heterogeneous bone marrow signal, unchanged. Conus medullaris and cauda equina: Conus extends to the L1 level. Conus and cauda equina appear normal. Paraspinal and other soft tissues: Negative  Disc levels: L1-L2: Disc desiccation and minimal bulge. No spinal canal stenosis. No neural foraminal stenosis. L2-L3: Small right asymmetric disc bulge. Right lateral recess narrowing without central spinal canal stenosis. No neural foraminal stenosis. L3-L4: Unchanged medium-sized disc bulge and mild facet hypertrophy. Narrowing of both lateral recesses without central spinal  canal stenosis. No neural foraminal stenosis. L4-L5: Small disc bulge. No spinal canal stenosis. Unchanged moderate left neural foraminal stenosis. L5-S1: Unchanged small central disc extrusion. No spinal canal stenosis. Progression of severe left neural foraminal stenosis. Visualized sacrum: Normal. IMPRESSION: 1. Progression of severe left L5-S1 neural foraminal stenosis. 2. Unchanged moderate left L4-5 neural foraminal stenosis. 3. No central spinal canal stenosis. Electronically Signed   By: Ulyses Jarred M.D.   On: 06/17/2021 02:04     PROCEDURES:  Critical Care performed: No      .1-3 Lead EKG Interpretation Performed by: Kamla Skilton, Delice Bison, DO Authorized by: Vaniyah Lansky, Delice Bison, DO     Interpretation: normal     ECG rate:  54   ECG rate assessment: bradycardic     Rhythm: sinus bradycardia     Ectopy: none     Conduction: normal      IMPRESSION / MDM / ASSESSMENT AND PLAN / ED COURSE  I reviewed the triage vital signs and the nursing notes.    Patient here with daughter with multiple complaints.  She is concerned about intermittent confusion over the past week.  She is concerned he could be dehydrated and have a UTI.  She is also concerned about increasing lower back pain.  The patient is on the cardiac monitor to evaluate for evidence of arrhythmia and/or significant heart rate changes.   DIFFERENTIAL DIAGNOSIS (includes but not limited to):   Dehydration, UTI, anemia, electrolyte derangement, ACS, pneumonia, COVID encephalopathy, intracranial hemorrhage, CVA, cauda equina, spinal stenosis, epidural abscess  or hematoma, discitis or osteomyelitis   PLAN: We will obtain CBC, CMP, troponin, urinalysis, chest x-ray, CT head, MRI of the lumbar spine.  Given Tylenol from triage for back pain.   MEDICATIONS GIVEN IN ED: Medications  fentaNYL (SUBLIMAZE) injection 50 mcg (0 mcg Intravenous Hold 06/17/21 0103)  ondansetron (ZOFRAN) injection 4 mg (4 mg Intravenous Not Given 06/17/21 0104)  acetaminophen (TYLENOL) tablet 650 mg (650 mg Oral Given 06/16/21 2342)  lactated ringers bolus 500 mL (0 mLs Intravenous Stopped 06/17/21 0103)     ED COURSE: Patient's work-up today has been reassuring.  He is mildly anemic which is stable.  Normal electrolytes.  Negative troponin.  Urine does not appear infected or shows significant signs of dehydration.  He reports back pain completely resolved with Tylenol and has been able to ambulate at his baseline.  Has been intermittently bradycardic into the upper 40s and low 50s but on review of his records I see that he has had heart rates this low all the way back to 2013.  He is not on any beta-blockers.  He denies any chest pain or shortness of breath.  His EKG is nonischemic.  His blood pressure is normal to high.  Chest x-ray reviewed by myself and radiologist is clear.  COVID and flu swabs negative.  CT head reviewed by myself and radiologist shows no acute intracranial abnormality.  I reviewed the MRI of his lumbar spine and radiology states that he has progression of the foraminal stenosis on the left side of his lumbar spine but no spinal stenosis or other neurosurgical emergency.  Given his pain has been well controlled with Tylenol, I do not feel he needs a prescription for narcotic pain medication.  Recommended he use Tylenol at home as needed.  He has been able to ambulate at his baseline.  I feel he would benefit from having a walker at home.  I have also placed a consult for  social work, physical therapy, nursing for home.  I have recommended close follow-up with his  primary care doctor as an outpatient.  At this time his extensive work-up has been quite reassuring and I feel he is safe to be discharged home.  Daughter is comfortable with this plan.   At this time, I do not feel there is any life-threatening condition present. I reviewed all nursing notes, vitals, pertinent previous records.  All lab and urine results, EKGs, imaging ordered have been independently reviewed and interpreted by myself.  I reviewed all available radiology reports from any imaging ordered this visit.  Based on my assessment, I feel the patient is safe to be discharged home without further emergent workup and can continue workup as an outpatient as needed. Discussed all findings, treatment plan as well as usual and customary return precautions with patient and daughter.  They verbalize understanding and are comfortable with this plan.  Outpatient follow-up has been provided as needed.  All questions have been answered.    CONSULTS: No admission needed at this time given patient is oriented, neurologically intact with reassuring emergent work-up.   OUTSIDE RECORDS REVIEWED: Reviewed patient's records from [facility] on 02/27/2021.  Patient's last epidural injection was in September 2022.  It appears that patient has been on baclofen with good relief of pain.  He has been prescribed lidocaine patches and physical therapy.  He has been seen by neurosurgery and they recommend conservative management.         FINAL CLINICAL IMPRESSION(S) / ED DIAGNOSES   Final diagnoses:  Disorientation  Chronic bilateral low back pain without sciatica  Sinus bradycardia     Rx / DC Orders   ED Discharge Orders          Ordered    Walker rolling        06/17/21 [place]        06/17/21 0232    Face-to-face encounter (required for Medicare/Medicaid patients)       Comments: I Kenisha Lynds certify that this patient is under my care and that I, or a nurse practitioner or physician's  assistant working with me, had a face-to-face encounter that meets the physician face-to-face encounter requirements with this patient on 06/17/2021. The encounter with the patient was in whole, or in part for the following medical condition(s) which is the primary reason for home health care (List medical condition): Chronic back pain, intermittent confusion, difficulty ambulating, needs help with medication management   06/17/21 0232             Note:  This document was prepared using Dragon voice recognition software and may include unintentional dictation errors.   Loa Idler, Delice Bison, DO 06/17/21 [phone]

## 2021-06-17 NOTE — ED Notes (Signed)
Discharge instructions and follow-up info provided to patient and his daughter. All questions answered. Patient taken out by wheelchair by nurse.

## 2021-06-17 NOTE — ED Notes (Signed)
Patient resting on stretcher in room. RR even and unlabored. Patient states he is having no pain "thanks to the Tylenol." This nurse had him scale his pain 0-10 and he stated 0. Patient also denies nausea.

## 2021-06-17 NOTE — ED Notes (Signed)
Patient resting comfortably on stretcher. RR even and unlabored. Patient states someone was supposed to be getting him a candybar but hasnt been back. His daughter went to the waiting room to keep him calmer to which it has. Vital signs have been reassessed as well as his pain level.

## 2021-06-17 NOTE — ED Notes (Signed)
Patient resting comfortably on stretcher. RR even and unlabored. Patient denies any pain or nausea at this time. Family is at bedside.

## 2021-06-17 NOTE — Discharge Instructions (Addendum)
Your labs, urine, CXR were reassuring today.  Your covid and flu tests were negative.    I recommend you use a walker to help you with ambulation.   MRI of the lumbar spine showed: 1. Progression of severe left L5-S1 neural foraminal stenosis.  2. Unchanged moderate left L4-5 neural foraminal stenosis.  3. No central spinal canal stenosis.    Please continue to follow-up with your primary care doctor and pain management specialist.   You may take over-the-counter Tylenol 1000 mg every 6 hours as needed for pain.

## 2021-06-17 NOTE — ED Notes (Signed)
Patient to MRI via stretcher with MRI tech

## 2021-07-03 ENCOUNTER — Ambulatory Visit: Payer: Medicare Other | Admitting: Family Medicine

## 2021-07-04 ENCOUNTER — Emergency Department
Admission: EM | Admit: 2021-07-04 | Discharge: 2021-07-04 | Disposition: A | Discharge status: Home / Self Care | Payer: No Typology Code available for payment source | Attending: Emergency Medicine | Admitting: Emergency Medicine

## 2021-07-04 ENCOUNTER — Other Ambulatory Visit: Payer: Self-pay

## 2021-07-04 DIAGNOSIS — F419 Anxiety disorder, unspecified: Secondary | ICD-10-CM | POA: Insufficient documentation

## 2021-07-04 DIAGNOSIS — I10 Essential (primary) hypertension: Secondary | ICD-10-CM | POA: Insufficient documentation

## 2021-07-04 DIAGNOSIS — E039 Hypothyroidism, unspecified: Secondary | ICD-10-CM | POA: Diagnosis not present

## 2021-07-04 DIAGNOSIS — I2510 Atherosclerotic heart disease of native coronary artery without angina pectoris: Secondary | ICD-10-CM | POA: Diagnosis not present

## 2021-07-04 NOTE — ED Notes (Signed)
Resting quietly at present.

## 2021-07-04 NOTE — ED Notes (Signed)
Sitting on stretcher  awaiting family  Provider in with pt at present

## 2021-07-04 NOTE — Discharge Instructions (Addendum)
-  Please continue taking your prescribed medications. -Follow-up with the psychiatrist listed above, as discussed. -Return to the emergency department anytime if you begin to experience any new or worsening symptoms.

## 2021-07-04 NOTE — ED Provider Notes (Signed)
[facility] Provider Note    Event Date/Time   First MD Initiated Contact with Patient 07/04/21 1514     (approximate)   History   Chief Complaint Anxiety   HPI Allen David is a 88 y.o. male, history of Sjogren's, CAD, hypertension, anxiety/depression, hypothyroidism, adrenal adenoma, and GERD, presents to the emergency department for evaluation of anxiety.  He says that he currently lives with his daughter and son, who reportedly caused him a significant amount of stress.  He states that his son is reportedly involved in "drug ring" which sparks a number of arguments with him.  The patient states that he was involved in a verbal altercation with both the daughter and son earlier this morning.  He states that he felt like he was going to "explode".  Denies chest pain, shortness of breath, headache, urinary symptoms, abdominal pain, nausea/vomiting, or dizziness/weakness.  Denies SI/HI.  Denies visual or auditory hallucinations.  He was dropped off at the hospital by his daughter, Allen David.  I spoke with his daughter, Allen David, over the phone who states that the patient has been very stressed and frustrated lately.  She states that he has been previously prescribed Prozac and Xanax to deal with his anxiety, however he has recently stopped taking it because he states that it does not work.  When the patient was last evaluated for anxiety, it was at the [place] who referred him to a geriatric psychologist.  He is unable to get an appointment with this geriatric psychologist until 2 months from now.  She states that she is unsure if he can make it until then, though does not endorse any HI/SI.   History Limitations: Patient is a poor historian.      Physical Exam  Triage Vital Signs: ED Triage Vitals  Enc Vitals Group     BP 07/04/21 1027 (!) 156/95     Pulse Rate 07/04/21 1027 85     Resp 07/04/21 1027 19     Temp 07/04/21 1027 98 F (36.7 C)     Temp  src --      SpO2 07/04/21 1027 100 %     Weight 07/04/21 1520 169 lb 1.5 oz (76.7 kg)     Height 07/04/21 1520 6' (1.829 m)     Head Circumference --      Peak Flow --      Pain Score 07/04/21 1027 0     Pain Loc --      Pain Edu? --      Excl. in [place]? --     Most recent vital signs: Vitals:   07/04/21 1814 07/04/21 2047  BP: (!) 148/80 136/61  Pulse: 78 (!) 57  Resp:  20  Temp:    SpO2: 100% 100%    General: Awake, NAD.  Appears uncomfortable CV: Good peripheral perfusion.  Resp: Normal effort.  Abd: Soft, non-tender. No distention.  Neuro: At baseline. No gross neurological deficits. Other: Cranial nerves II through XII intact.  Normal cerebellar function.  AAOX4.   Physical Exam    ED Results / Procedures / Treatments  Labs (all labs ordered are listed, but only abnormal results are displayed) Labs Reviewed - No data to display   EKG Sinus bradycardia, rate of 55, first-degree AV block, no ST segment elevations or depressions, left axis deviation present   RADIOLOGY  ED Provider Interpretation: Not applicable.  No results found.  PROCEDURES:  Critical Care performed: None.  Procedures    MEDICATIONS ORDERED IN ED: Medications - No data to display   IMPRESSION / MDM / [place] / ED COURSE  I reviewed the triage vital signs and the nursing notes.                              Allen David is a 88 y.o. male, history of Sjogren's, CAD, hypertension, anxiety/depression, hypothyroidism, adrenal adenoma, and GERD, presents to the emergency department for evaluation of anxiety.  Differential diagnosis includes, but is not limited to, anxiety, depression, psychosis, ACS.  ED Course Patient appears well.  Vitals within normal limits.  NAD.  He is currently calm and asymptomatic at this time  EKG unremarkable for arrhythmias or evidence of ACS  Upon further discussion with the patient, patient states that his anxiety is directly linked to  his relationship with his grandson.  He states that the grandson has been involved in "drug activity" and is frequently shouting and threatening people over the phone, which causes the patient distress.  Patient states that he feels safe at home and has not received any threats from the son directly.  Denies any abuse.  However, he states that he is unable to cope with the stress on his current medication regimen.  Assessment/Plan The patient's history and exam are consistent with generalized anxiety disorder.  No evidence of psychosis.  Patient does not endorse any SI or HI.  He is currently already on Prozac and Xanax as needed.  However, he states that he has not been taking his Prozac recently because he does not feel it is working.  I encouraged the patient to continue taking his Prozac until his next appointment with a mental health provider.  We will provide this patient a referral to one of our local behavioral health providers in hopes that he will be able to get an appointment prior to his established appointment in April with the [place].   I spoke with the daughter and recommended that she attempt to reduce stressors in the home for the patient and minimize interactions between the patient and her son for the time being to prevent potential for harmful outcomes.  Advised her of the treatment plan and she agreed to support it.  We will plan to discharge this patient  Patient was provided with anticipatory guidance, return precautions, and educational material. Encouraged the patient to return to the emergency department at any time if they begin to experience any new or worsening symptoms.       FINAL CLINICAL IMPRESSION(S) / ED DIAGNOSES   Final diagnoses:  Anxiety     Rx / DC Orders   ED Discharge Orders     None        Note:  This document was prepared using Dragon voice recognition software and may include unintentional dictation errors.   Teodoro Spray, PA 07/05/21  0045    Lucrezia Starch, MD 07/05/21 [phone]

## 2021-07-04 NOTE — ED Triage Notes (Signed)
Pt comes with c/o anxiety. Pt states he is not taking any meds. Pt now rambling on and speaking fast. Unable to understand pt.  Pt states he took a xanax twice today. Pt states he need a shot to calm him down.

## 2021-07-13 ENCOUNTER — Other Ambulatory Visit: Payer: Self-pay | Admitting: Family Medicine

## 2021-07-15 NOTE — Telephone Encounter (Signed)
I have not been filling this medication for him. It has been coming from the [place]. He needs to contact them for a refill.

## 2021-07-31 DIAGNOSIS — M47816 Spondylosis without myelopathy or radiculopathy, lumbar region: Secondary | ICD-10-CM | POA: Diagnosis not present

## 2021-07-31 DIAGNOSIS — M48062 Spinal stenosis, lumbar region with neurogenic claudication: Secondary | ICD-10-CM | POA: Diagnosis not present

## 2021-07-31 DIAGNOSIS — M5416 Radiculopathy, lumbar region: Secondary | ICD-10-CM | POA: Diagnosis not present

## 2021-07-31 DIAGNOSIS — M5136 Other intervertebral disc degeneration, lumbar region: Secondary | ICD-10-CM | POA: Diagnosis not present

## 2021-08-02 ENCOUNTER — Ambulatory Visit: Payer: Self-pay | Admitting: Family Medicine

## 2021-08-03 ENCOUNTER — Other Ambulatory Visit: Payer: Self-pay | Admitting: Family Medicine

## 2021-08-05 NOTE — Telephone Encounter (Signed)
Refill sent to pharmacy though he needs follow-up scheduled with me as I have not seen him since June 2022.  ?

## 2021-08-08 DIAGNOSIS — Z9181 History of falling: Secondary | ICD-10-CM | POA: Diagnosis not present

## 2021-08-08 DIAGNOSIS — F32A Depression, unspecified: Secondary | ICD-10-CM | POA: Diagnosis not present

## 2021-08-08 DIAGNOSIS — D649 Anemia, unspecified: Secondary | ICD-10-CM | POA: Diagnosis not present

## 2021-08-08 DIAGNOSIS — G8929 Other chronic pain: Secondary | ICD-10-CM | POA: Diagnosis not present

## 2021-08-08 DIAGNOSIS — E079 Disorder of thyroid, unspecified: Secondary | ICD-10-CM | POA: Diagnosis not present

## 2021-08-08 DIAGNOSIS — I10 Essential (primary) hypertension: Secondary | ICD-10-CM | POA: Diagnosis not present

## 2021-08-08 DIAGNOSIS — M5136 Other intervertebral disc degeneration, lumbar region: Secondary | ICD-10-CM | POA: Diagnosis not present

## 2021-08-08 DIAGNOSIS — F419 Anxiety disorder, unspecified: Secondary | ICD-10-CM | POA: Diagnosis not present

## 2021-08-08 DIAGNOSIS — M4726 Other spondylosis with radiculopathy, lumbar region: Secondary | ICD-10-CM | POA: Diagnosis not present

## 2021-08-08 DIAGNOSIS — Z87891 Personal history of nicotine dependence: Secondary | ICD-10-CM | POA: Diagnosis not present

## 2021-08-08 DIAGNOSIS — Z79891 Long term (current) use of opiate analgesic: Secondary | ICD-10-CM | POA: Diagnosis not present

## 2021-08-08 DIAGNOSIS — K589 Irritable bowel syndrome without diarrhea: Secondary | ICD-10-CM | POA: Diagnosis not present

## 2021-08-08 DIAGNOSIS — E785 Hyperlipidemia, unspecified: Secondary | ICD-10-CM | POA: Diagnosis not present

## 2021-08-08 DIAGNOSIS — N529 Male erectile dysfunction, unspecified: Secondary | ICD-10-CM | POA: Diagnosis not present

## 2021-08-14 DIAGNOSIS — M5136 Other intervertebral disc degeneration, lumbar region: Secondary | ICD-10-CM | POA: Diagnosis not present

## 2021-08-14 DIAGNOSIS — M4726 Other spondylosis with radiculopathy, lumbar region: Secondary | ICD-10-CM | POA: Diagnosis not present

## 2021-08-14 DIAGNOSIS — I10 Essential (primary) hypertension: Secondary | ICD-10-CM | POA: Diagnosis not present

## 2021-08-14 DIAGNOSIS — F419 Anxiety disorder, unspecified: Secondary | ICD-10-CM | POA: Diagnosis not present

## 2021-08-14 DIAGNOSIS — D649 Anemia, unspecified: Secondary | ICD-10-CM | POA: Diagnosis not present

## 2021-08-14 DIAGNOSIS — G8929 Other chronic pain: Secondary | ICD-10-CM | POA: Diagnosis not present

## 2021-08-16 DIAGNOSIS — I10 Essential (primary) hypertension: Secondary | ICD-10-CM | POA: Diagnosis not present

## 2021-08-16 DIAGNOSIS — F419 Anxiety disorder, unspecified: Secondary | ICD-10-CM | POA: Diagnosis not present

## 2021-08-16 DIAGNOSIS — D649 Anemia, unspecified: Secondary | ICD-10-CM | POA: Diagnosis not present

## 2021-08-16 DIAGNOSIS — M5136 Other intervertebral disc degeneration, lumbar region: Secondary | ICD-10-CM | POA: Diagnosis not present

## 2021-08-16 DIAGNOSIS — M4726 Other spondylosis with radiculopathy, lumbar region: Secondary | ICD-10-CM | POA: Diagnosis not present

## 2021-08-16 DIAGNOSIS — G8929 Other chronic pain: Secondary | ICD-10-CM | POA: Diagnosis not present

## 2021-08-23 DIAGNOSIS — M5136 Other intervertebral disc degeneration, lumbar region: Secondary | ICD-10-CM | POA: Diagnosis not present

## 2021-08-23 DIAGNOSIS — M4726 Other spondylosis with radiculopathy, lumbar region: Secondary | ICD-10-CM | POA: Diagnosis not present

## 2021-08-23 DIAGNOSIS — G8929 Other chronic pain: Secondary | ICD-10-CM | POA: Diagnosis not present

## 2021-08-23 DIAGNOSIS — D649 Anemia, unspecified: Secondary | ICD-10-CM | POA: Diagnosis not present

## 2021-08-23 DIAGNOSIS — I10 Essential (primary) hypertension: Secondary | ICD-10-CM | POA: Diagnosis not present

## 2021-08-23 DIAGNOSIS — F419 Anxiety disorder, unspecified: Secondary | ICD-10-CM | POA: Diagnosis not present

## 2021-08-28 DIAGNOSIS — M4726 Other spondylosis with radiculopathy, lumbar region: Secondary | ICD-10-CM | POA: Diagnosis not present

## 2021-08-28 DIAGNOSIS — F419 Anxiety disorder, unspecified: Secondary | ICD-10-CM | POA: Diagnosis not present

## 2021-08-28 DIAGNOSIS — M5136 Other intervertebral disc degeneration, lumbar region: Secondary | ICD-10-CM | POA: Diagnosis not present

## 2021-08-28 DIAGNOSIS — D649 Anemia, unspecified: Secondary | ICD-10-CM | POA: Diagnosis not present

## 2021-08-28 DIAGNOSIS — I10 Essential (primary) hypertension: Secondary | ICD-10-CM | POA: Diagnosis not present

## 2021-08-28 DIAGNOSIS — G8929 Other chronic pain: Secondary | ICD-10-CM | POA: Diagnosis not present

## 2021-08-30 DIAGNOSIS — M5136 Other intervertebral disc degeneration, lumbar region: Secondary | ICD-10-CM | POA: Diagnosis not present

## 2021-08-30 DIAGNOSIS — G8929 Other chronic pain: Secondary | ICD-10-CM | POA: Diagnosis not present

## 2021-08-30 DIAGNOSIS — I10 Essential (primary) hypertension: Secondary | ICD-10-CM | POA: Diagnosis not present

## 2021-08-30 DIAGNOSIS — M4726 Other spondylosis with radiculopathy, lumbar region: Secondary | ICD-10-CM | POA: Diagnosis not present

## 2021-08-30 DIAGNOSIS — F419 Anxiety disorder, unspecified: Secondary | ICD-10-CM | POA: Diagnosis not present

## 2021-08-30 DIAGNOSIS — D649 Anemia, unspecified: Secondary | ICD-10-CM | POA: Diagnosis not present

## 2021-09-01 ENCOUNTER — Other Ambulatory Visit: Payer: Self-pay | Admitting: Family Medicine

## 2021-09-01 DIAGNOSIS — E538 Deficiency of other specified B group vitamins: Secondary | ICD-10-CM

## 2021-09-02 MED ORDER — PANTOPRAZOLE SODIUM 40 MG PO TBEC: 40.0000 mg | DELAYED_RELEASE_TABLET | Freq: Every day | ORAL | 0 refills | Status: DC

## 2021-09-02 MED ORDER — "LUER LOCK SAFETY SYRINGES 25G X 1"" 3 ML MISC": 1.0000 mL | 0 refills | Status: DC

## 2021-09-02 MED ORDER — HYDROXYCHLOROQUINE SULFATE 200 MG PO TABS: 200.0000 mg | ORAL_TABLET | Freq: Two times a day (BID) | ORAL | 1 refills | Status: DC

## 2021-09-02 MED ORDER — CYANOCOBALAMIN 1000 MCG/ML IJ SOLN: 1000.0000 ug | INTRAMUSCULAR | 0 refills | Status: DC

## 2021-09-02 MED ORDER — AMLODIPINE BESYLATE 5 MG PO TABS: 5.0000 mg | ORAL_TABLET | Freq: Every day | ORAL | 0 refills | Status: DC

## 2021-09-02 NOTE — Telephone Encounter (Signed)
I believe this medication is managed by psychiatry at the [place]. He should contact them to have this filled.  ?

## 2021-09-03 NOTE — Telephone Encounter (Signed)
LMTCB on cell phone & was unable to LM on home phone listed.  ?

## 2021-09-04 DIAGNOSIS — G8929 Other chronic pain: Secondary | ICD-10-CM | POA: Diagnosis not present

## 2021-09-04 DIAGNOSIS — M5136 Other intervertebral disc degeneration, lumbar region: Secondary | ICD-10-CM | POA: Diagnosis not present

## 2021-09-04 DIAGNOSIS — M4726 Other spondylosis with radiculopathy, lumbar region: Secondary | ICD-10-CM | POA: Diagnosis not present

## 2021-09-04 DIAGNOSIS — I10 Essential (primary) hypertension: Secondary | ICD-10-CM | POA: Diagnosis not present

## 2021-09-04 DIAGNOSIS — D649 Anemia, unspecified: Secondary | ICD-10-CM | POA: Diagnosis not present

## 2021-09-04 DIAGNOSIS — F419 Anxiety disorder, unspecified: Secondary | ICD-10-CM | POA: Diagnosis not present

## 2021-09-06 DIAGNOSIS — F419 Anxiety disorder, unspecified: Secondary | ICD-10-CM | POA: Diagnosis not present

## 2021-09-06 DIAGNOSIS — D649 Anemia, unspecified: Secondary | ICD-10-CM | POA: Diagnosis not present

## 2021-09-06 DIAGNOSIS — I10 Essential (primary) hypertension: Secondary | ICD-10-CM | POA: Diagnosis not present

## 2021-09-06 DIAGNOSIS — G8929 Other chronic pain: Secondary | ICD-10-CM | POA: Diagnosis not present

## 2021-09-06 DIAGNOSIS — M4726 Other spondylosis with radiculopathy, lumbar region: Secondary | ICD-10-CM | POA: Diagnosis not present

## 2021-09-06 DIAGNOSIS — M5136 Other intervertebral disc degeneration, lumbar region: Secondary | ICD-10-CM | POA: Diagnosis not present

## 2021-09-07 DIAGNOSIS — Z87891 Personal history of nicotine dependence: Secondary | ICD-10-CM | POA: Diagnosis not present

## 2021-09-07 DIAGNOSIS — Z9181 History of falling: Secondary | ICD-10-CM | POA: Diagnosis not present

## 2021-09-07 DIAGNOSIS — Z79891 Long term (current) use of opiate analgesic: Secondary | ICD-10-CM | POA: Diagnosis not present

## 2021-09-07 DIAGNOSIS — I10 Essential (primary) hypertension: Secondary | ICD-10-CM | POA: Diagnosis not present

## 2021-09-07 DIAGNOSIS — E785 Hyperlipidemia, unspecified: Secondary | ICD-10-CM | POA: Diagnosis not present

## 2021-09-07 DIAGNOSIS — F32A Depression, unspecified: Secondary | ICD-10-CM | POA: Diagnosis not present

## 2021-09-07 DIAGNOSIS — M5136 Other intervertebral disc degeneration, lumbar region: Secondary | ICD-10-CM | POA: Diagnosis not present

## 2021-09-07 DIAGNOSIS — G8929 Other chronic pain: Secondary | ICD-10-CM | POA: Diagnosis not present

## 2021-09-07 DIAGNOSIS — D649 Anemia, unspecified: Secondary | ICD-10-CM | POA: Diagnosis not present

## 2021-09-07 DIAGNOSIS — K589 Irritable bowel syndrome without diarrhea: Secondary | ICD-10-CM | POA: Diagnosis not present

## 2021-09-07 DIAGNOSIS — M4726 Other spondylosis with radiculopathy, lumbar region: Secondary | ICD-10-CM | POA: Diagnosis not present

## 2021-09-07 DIAGNOSIS — E079 Disorder of thyroid, unspecified: Secondary | ICD-10-CM | POA: Diagnosis not present

## 2021-09-07 DIAGNOSIS — F419 Anxiety disorder, unspecified: Secondary | ICD-10-CM | POA: Diagnosis not present

## 2021-09-07 DIAGNOSIS — N529 Male erectile dysfunction, unspecified: Secondary | ICD-10-CM | POA: Diagnosis not present

## 2021-09-11 DIAGNOSIS — M47816 Spondylosis without myelopathy or radiculopathy, lumbar region: Secondary | ICD-10-CM | POA: Diagnosis not present

## 2021-09-11 DIAGNOSIS — M5136 Other intervertebral disc degeneration, lumbar region: Secondary | ICD-10-CM | POA: Diagnosis not present

## 2021-09-11 DIAGNOSIS — M48062 Spinal stenosis, lumbar region with neurogenic claudication: Secondary | ICD-10-CM | POA: Diagnosis not present

## 2021-09-11 DIAGNOSIS — M5416 Radiculopathy, lumbar region: Secondary | ICD-10-CM | POA: Diagnosis not present

## 2021-09-13 DIAGNOSIS — I10 Essential (primary) hypertension: Secondary | ICD-10-CM | POA: Diagnosis not present

## 2021-09-13 DIAGNOSIS — F419 Anxiety disorder, unspecified: Secondary | ICD-10-CM | POA: Diagnosis not present

## 2021-09-13 DIAGNOSIS — G8929 Other chronic pain: Secondary | ICD-10-CM | POA: Diagnosis not present

## 2021-09-13 DIAGNOSIS — D649 Anemia, unspecified: Secondary | ICD-10-CM | POA: Diagnosis not present

## 2021-09-13 DIAGNOSIS — M4726 Other spondylosis with radiculopathy, lumbar region: Secondary | ICD-10-CM | POA: Diagnosis not present

## 2021-09-13 DIAGNOSIS — M5136 Other intervertebral disc degeneration, lumbar region: Secondary | ICD-10-CM | POA: Diagnosis not present

## 2021-09-18 DIAGNOSIS — M5136 Other intervertebral disc degeneration, lumbar region: Secondary | ICD-10-CM | POA: Diagnosis not present

## 2021-09-18 DIAGNOSIS — F419 Anxiety disorder, unspecified: Secondary | ICD-10-CM | POA: Diagnosis not present

## 2021-09-18 DIAGNOSIS — G8929 Other chronic pain: Secondary | ICD-10-CM | POA: Diagnosis not present

## 2021-09-18 DIAGNOSIS — M4726 Other spondylosis with radiculopathy, lumbar region: Secondary | ICD-10-CM | POA: Diagnosis not present

## 2021-09-18 DIAGNOSIS — D649 Anemia, unspecified: Secondary | ICD-10-CM | POA: Diagnosis not present

## 2021-09-18 DIAGNOSIS — I10 Essential (primary) hypertension: Secondary | ICD-10-CM | POA: Diagnosis not present

## 2021-09-20 NOTE — Telephone Encounter (Signed)
I called and phone only rang with no answer.  ?

## 2021-09-23 ENCOUNTER — Other Ambulatory Visit: Payer: Self-pay | Admitting: Family Medicine

## 2021-09-23 DIAGNOSIS — E538 Deficiency of other specified B group vitamins: Secondary | ICD-10-CM

## 2021-09-23 NOTE — Telephone Encounter (Signed)
I have been unable to reach can you refuse the Xanax? ?

## 2021-09-23 NOTE — Telephone Encounter (Signed)
Duplicates again & I cannot refuse the Xanax. ?

## 2021-09-23 NOTE — Telephone Encounter (Signed)
The xanax is prescribed by his [place] psychiatrist. He needs to get refills from them.  ?

## 2021-09-25 DIAGNOSIS — G8929 Other chronic pain: Secondary | ICD-10-CM | POA: Diagnosis not present

## 2021-09-25 DIAGNOSIS — M5136 Other intervertebral disc degeneration, lumbar region: Secondary | ICD-10-CM | POA: Diagnosis not present

## 2021-09-25 DIAGNOSIS — D649 Anemia, unspecified: Secondary | ICD-10-CM | POA: Diagnosis not present

## 2021-09-25 DIAGNOSIS — F419 Anxiety disorder, unspecified: Secondary | ICD-10-CM | POA: Diagnosis not present

## 2021-09-25 DIAGNOSIS — M4726 Other spondylosis with radiculopathy, lumbar region: Secondary | ICD-10-CM | POA: Diagnosis not present

## 2021-09-25 DIAGNOSIS — I10 Essential (primary) hypertension: Secondary | ICD-10-CM | POA: Diagnosis not present

## 2021-10-04 DIAGNOSIS — M5136 Other intervertebral disc degeneration, lumbar region: Secondary | ICD-10-CM | POA: Diagnosis not present

## 2021-10-04 DIAGNOSIS — G8929 Other chronic pain: Secondary | ICD-10-CM | POA: Diagnosis not present

## 2021-10-04 DIAGNOSIS — F419 Anxiety disorder, unspecified: Secondary | ICD-10-CM | POA: Diagnosis not present

## 2021-10-04 DIAGNOSIS — D649 Anemia, unspecified: Secondary | ICD-10-CM | POA: Diagnosis not present

## 2021-10-04 DIAGNOSIS — I10 Essential (primary) hypertension: Secondary | ICD-10-CM | POA: Diagnosis not present

## 2021-10-04 DIAGNOSIS — M4726 Other spondylosis with radiculopathy, lumbar region: Secondary | ICD-10-CM | POA: Diagnosis not present

## 2021-10-07 DIAGNOSIS — M4726 Other spondylosis with radiculopathy, lumbar region: Secondary | ICD-10-CM | POA: Diagnosis not present

## 2021-10-07 DIAGNOSIS — G8929 Other chronic pain: Secondary | ICD-10-CM | POA: Diagnosis not present

## 2021-10-07 DIAGNOSIS — K589 Irritable bowel syndrome without diarrhea: Secondary | ICD-10-CM | POA: Diagnosis not present

## 2021-10-07 DIAGNOSIS — Z87891 Personal history of nicotine dependence: Secondary | ICD-10-CM | POA: Diagnosis not present

## 2021-10-07 DIAGNOSIS — E079 Disorder of thyroid, unspecified: Secondary | ICD-10-CM | POA: Diagnosis not present

## 2021-10-07 DIAGNOSIS — D649 Anemia, unspecified: Secondary | ICD-10-CM | POA: Diagnosis not present

## 2021-10-07 DIAGNOSIS — Z79891 Long term (current) use of opiate analgesic: Secondary | ICD-10-CM | POA: Diagnosis not present

## 2021-10-07 DIAGNOSIS — M5136 Other intervertebral disc degeneration, lumbar region: Secondary | ICD-10-CM | POA: Diagnosis not present

## 2021-10-07 DIAGNOSIS — E785 Hyperlipidemia, unspecified: Secondary | ICD-10-CM | POA: Diagnosis not present

## 2021-10-07 DIAGNOSIS — I10 Essential (primary) hypertension: Secondary | ICD-10-CM | POA: Diagnosis not present

## 2021-10-07 DIAGNOSIS — Z9181 History of falling: Secondary | ICD-10-CM | POA: Diagnosis not present

## 2021-10-07 DIAGNOSIS — F419 Anxiety disorder, unspecified: Secondary | ICD-10-CM | POA: Diagnosis not present

## 2021-10-07 DIAGNOSIS — N529 Male erectile dysfunction, unspecified: Secondary | ICD-10-CM | POA: Diagnosis not present

## 2021-10-07 DIAGNOSIS — F32A Depression, unspecified: Secondary | ICD-10-CM | POA: Diagnosis not present

## 2021-10-09 DIAGNOSIS — M5136 Other intervertebral disc degeneration, lumbar region: Secondary | ICD-10-CM | POA: Diagnosis not present

## 2021-10-09 DIAGNOSIS — G8929 Other chronic pain: Secondary | ICD-10-CM | POA: Diagnosis not present

## 2021-10-09 DIAGNOSIS — D649 Anemia, unspecified: Secondary | ICD-10-CM | POA: Diagnosis not present

## 2021-10-09 DIAGNOSIS — M4726 Other spondylosis with radiculopathy, lumbar region: Secondary | ICD-10-CM | POA: Diagnosis not present

## 2021-10-09 DIAGNOSIS — I10 Essential (primary) hypertension: Secondary | ICD-10-CM | POA: Diagnosis not present

## 2021-10-09 DIAGNOSIS — F419 Anxiety disorder, unspecified: Secondary | ICD-10-CM | POA: Diagnosis not present

## 2021-10-17 DIAGNOSIS — G8929 Other chronic pain: Secondary | ICD-10-CM | POA: Diagnosis not present

## 2021-10-17 DIAGNOSIS — D649 Anemia, unspecified: Secondary | ICD-10-CM | POA: Diagnosis not present

## 2021-10-17 DIAGNOSIS — I10 Essential (primary) hypertension: Secondary | ICD-10-CM | POA: Diagnosis not present

## 2021-10-17 DIAGNOSIS — F419 Anxiety disorder, unspecified: Secondary | ICD-10-CM | POA: Diagnosis not present

## 2021-10-17 DIAGNOSIS — M4726 Other spondylosis with radiculopathy, lumbar region: Secondary | ICD-10-CM | POA: Diagnosis not present

## 2021-10-17 DIAGNOSIS — M5136 Other intervertebral disc degeneration, lumbar region: Secondary | ICD-10-CM | POA: Diagnosis not present

## 2021-10-22 DIAGNOSIS — I10 Essential (primary) hypertension: Secondary | ICD-10-CM | POA: Diagnosis not present

## 2021-10-22 DIAGNOSIS — F419 Anxiety disorder, unspecified: Secondary | ICD-10-CM | POA: Diagnosis not present

## 2021-10-22 DIAGNOSIS — D649 Anemia, unspecified: Secondary | ICD-10-CM | POA: Diagnosis not present

## 2021-10-22 DIAGNOSIS — M4726 Other spondylosis with radiculopathy, lumbar region: Secondary | ICD-10-CM | POA: Diagnosis not present

## 2021-10-22 DIAGNOSIS — M5136 Other intervertebral disc degeneration, lumbar region: Secondary | ICD-10-CM | POA: Diagnosis not present

## 2021-10-22 DIAGNOSIS — G8929 Other chronic pain: Secondary | ICD-10-CM | POA: Diagnosis not present

## 2021-10-23 ENCOUNTER — Telehealth: Payer: Self-pay | Admitting: Family Medicine

## 2021-10-23 NOTE — Telephone Encounter (Signed)
Attempted to schedule AWV. Unable to LVM.  Will try at later time.  

## 2021-10-30 DIAGNOSIS — D649 Anemia, unspecified: Secondary | ICD-10-CM | POA: Diagnosis not present

## 2021-10-30 DIAGNOSIS — F419 Anxiety disorder, unspecified: Secondary | ICD-10-CM | POA: Diagnosis not present

## 2021-10-30 DIAGNOSIS — I10 Essential (primary) hypertension: Secondary | ICD-10-CM | POA: Diagnosis not present

## 2021-10-30 DIAGNOSIS — G8929 Other chronic pain: Secondary | ICD-10-CM | POA: Diagnosis not present

## 2021-10-30 DIAGNOSIS — M5136 Other intervertebral disc degeneration, lumbar region: Secondary | ICD-10-CM | POA: Diagnosis not present

## 2021-10-30 DIAGNOSIS — M4726 Other spondylosis with radiculopathy, lumbar region: Secondary | ICD-10-CM | POA: Diagnosis not present

## 2021-11-06 DIAGNOSIS — I10 Essential (primary) hypertension: Secondary | ICD-10-CM | POA: Diagnosis not present

## 2021-11-06 DIAGNOSIS — Z79891 Long term (current) use of opiate analgesic: Secondary | ICD-10-CM | POA: Diagnosis not present

## 2021-11-06 DIAGNOSIS — Z87891 Personal history of nicotine dependence: Secondary | ICD-10-CM | POA: Diagnosis not present

## 2021-11-06 DIAGNOSIS — E785 Hyperlipidemia, unspecified: Secondary | ICD-10-CM | POA: Diagnosis not present

## 2021-11-06 DIAGNOSIS — M5136 Other intervertebral disc degeneration, lumbar region: Secondary | ICD-10-CM | POA: Diagnosis not present

## 2021-11-06 DIAGNOSIS — N529 Male erectile dysfunction, unspecified: Secondary | ICD-10-CM | POA: Diagnosis not present

## 2021-11-06 DIAGNOSIS — M4726 Other spondylosis with radiculopathy, lumbar region: Secondary | ICD-10-CM | POA: Diagnosis not present

## 2021-11-06 DIAGNOSIS — D649 Anemia, unspecified: Secondary | ICD-10-CM | POA: Diagnosis not present

## 2021-11-06 DIAGNOSIS — Z9181 History of falling: Secondary | ICD-10-CM | POA: Diagnosis not present

## 2021-11-06 DIAGNOSIS — K589 Irritable bowel syndrome without diarrhea: Secondary | ICD-10-CM | POA: Diagnosis not present

## 2021-11-06 DIAGNOSIS — F419 Anxiety disorder, unspecified: Secondary | ICD-10-CM | POA: Diagnosis not present

## 2021-11-06 DIAGNOSIS — F32A Depression, unspecified: Secondary | ICD-10-CM | POA: Diagnosis not present

## 2021-11-06 DIAGNOSIS — G8929 Other chronic pain: Secondary | ICD-10-CM | POA: Diagnosis not present

## 2021-11-06 DIAGNOSIS — E079 Disorder of thyroid, unspecified: Secondary | ICD-10-CM | POA: Diagnosis not present

## 2021-11-14 ENCOUNTER — Other Ambulatory Visit: Payer: Self-pay | Admitting: Family Medicine

## 2021-11-22 DIAGNOSIS — M4726 Other spondylosis with radiculopathy, lumbar region: Secondary | ICD-10-CM | POA: Diagnosis not present

## 2021-11-22 DIAGNOSIS — F419 Anxiety disorder, unspecified: Secondary | ICD-10-CM | POA: Diagnosis not present

## 2021-11-22 DIAGNOSIS — I10 Essential (primary) hypertension: Secondary | ICD-10-CM | POA: Diagnosis not present

## 2021-11-22 DIAGNOSIS — D649 Anemia, unspecified: Secondary | ICD-10-CM | POA: Diagnosis not present

## 2021-11-22 DIAGNOSIS — G8929 Other chronic pain: Secondary | ICD-10-CM | POA: Diagnosis not present

## 2021-11-22 DIAGNOSIS — M5136 Other intervertebral disc degeneration, lumbar region: Secondary | ICD-10-CM | POA: Diagnosis not present

## 2021-11-28 DIAGNOSIS — M5136 Other intervertebral disc degeneration, lumbar region: Secondary | ICD-10-CM | POA: Diagnosis not present

## 2021-11-28 DIAGNOSIS — F419 Anxiety disorder, unspecified: Secondary | ICD-10-CM | POA: Diagnosis not present

## 2021-11-28 DIAGNOSIS — M4726 Other spondylosis with radiculopathy, lumbar region: Secondary | ICD-10-CM | POA: Diagnosis not present

## 2021-11-28 DIAGNOSIS — G8929 Other chronic pain: Secondary | ICD-10-CM | POA: Diagnosis not present

## 2021-11-28 DIAGNOSIS — D649 Anemia, unspecified: Secondary | ICD-10-CM | POA: Diagnosis not present

## 2021-11-28 DIAGNOSIS — I10 Essential (primary) hypertension: Secondary | ICD-10-CM | POA: Diagnosis not present

## 2021-12-16 ENCOUNTER — Other Ambulatory Visit: Payer: Self-pay | Admitting: Family Medicine

## 2021-12-23 ENCOUNTER — Telehealth: Payer: Self-pay

## 2021-12-23 NOTE — Telephone Encounter (Signed)
Patient's daughter, [name], called to state patient's tooth was bothering him last night. [name] states patient seemed fine this morning, however, when she arrived home from work, the right side of his face was swollen.  [name] states she is now trying to have a virtual visit with the [facility].

## 2021-12-26 ENCOUNTER — Other Ambulatory Visit: Payer: Self-pay | Admitting: Family Medicine

## 2021-12-26 DIAGNOSIS — E538 Deficiency of other specified B group vitamins: Secondary | ICD-10-CM

## 2021-12-30 ENCOUNTER — Telehealth: Payer: Self-pay

## 2021-12-30 DIAGNOSIS — M5136 Other intervertebral disc degeneration, lumbar region: Secondary | ICD-10-CM | POA: Diagnosis not present

## 2021-12-30 DIAGNOSIS — M48062 Spinal stenosis, lumbar region with neurogenic claudication: Secondary | ICD-10-CM | POA: Diagnosis not present

## 2021-12-30 DIAGNOSIS — M5416 Radiculopathy, lumbar region: Secondary | ICD-10-CM | POA: Diagnosis not present

## 2021-12-30 NOTE — Telephone Encounter (Signed)
Allen David 7 days ago    Patient's daughter, Allen David, called to state patient's tooth was bothering him last night. [name] states patient seemed fine this morning, however, when she arrived home from work, the right side of his face was swollen.  [name] states she is now trying to have a virtual visit with the [facility] Emergency Department.      Note    Allen David [phone]  Allen David

## 2021-12-30 NOTE — Telephone Encounter (Signed)
LMOM for the daughter give a CB for an Update on the pt

## 2021-12-31 NOTE — Telephone Encounter (Signed)
LVM for patients daughter  to call back.  Allen David,cma  

## 2022-01-03 NOTE — Telephone Encounter (Signed)
Lvm for pt's daughter to return call in regards to pt.

## 2022-01-16 DIAGNOSIS — M5416 Radiculopathy, lumbar region: Secondary | ICD-10-CM | POA: Diagnosis not present

## 2022-01-16 DIAGNOSIS — M48062 Spinal stenosis, lumbar region with neurogenic claudication: Secondary | ICD-10-CM | POA: Diagnosis not present

## 2022-01-17 ENCOUNTER — Telehealth: Payer: Self-pay | Admitting: Family Medicine

## 2022-01-17 NOTE — Telephone Encounter (Signed)
Copied from [place] [id number]. Topic: Medicare AWV >> Jan 17, 2022 11:50 AM Devoria Glassing wrote: Reason for CRM: Attempted to schedule AWV. Unable to LVM.  Will try at later time.

## 2022-02-07 DIAGNOSIS — M5137 Other intervertebral disc degeneration, lumbosacral region: Secondary | ICD-10-CM | POA: Diagnosis not present

## 2022-02-07 DIAGNOSIS — M5134 Other intervertebral disc degeneration, thoracic region: Secondary | ICD-10-CM | POA: Diagnosis not present

## 2022-02-07 DIAGNOSIS — M40204 Unspecified kyphosis, thoracic region: Secondary | ICD-10-CM | POA: Diagnosis not present

## 2022-02-07 DIAGNOSIS — M5136 Other intervertebral disc degeneration, lumbar region: Secondary | ICD-10-CM | POA: Diagnosis not present

## 2022-02-07 DIAGNOSIS — M5416 Radiculopathy, lumbar region: Secondary | ICD-10-CM | POA: Diagnosis not present

## 2022-02-07 DIAGNOSIS — M48062 Spinal stenosis, lumbar region with neurogenic claudication: Secondary | ICD-10-CM | POA: Diagnosis not present

## 2022-03-06 ENCOUNTER — Other Ambulatory Visit: Payer: Self-pay | Admitting: Family Medicine

## 2022-03-06 DIAGNOSIS — E538 Deficiency of other specified B group vitamins: Secondary | ICD-10-CM

## 2022-03-13 ENCOUNTER — Other Ambulatory Visit: Payer: Self-pay | Admitting: Family Medicine

## 2022-04-02 ENCOUNTER — Ambulatory Visit
Admission: EM | Admit: 2022-04-02 | Discharge: 2022-04-02 | Disposition: A | Discharge status: Home / Self Care | Payer: Medicare Other | Attending: Urgent Care | Admitting: Urgent Care

## 2022-04-02 DIAGNOSIS — H1013 Acute atopic conjunctivitis, bilateral: Secondary | ICD-10-CM | POA: Diagnosis not present

## 2022-04-02 MED ORDER — OLOPATADINE HCL 0.2 % OP SOLN: 1.0000 [drp] | Freq: Every day | OPHTHALMIC | 0 refills | Status: DC

## 2022-04-02 NOTE — ED Triage Notes (Signed)
Patient states his right eye was red yesterday and now both feel dry, slightly burning, gritty feeling and blurry vision. Patient states it feels like it could be allergies but he wanted to be sure it wasn't pink eye because his daughter is having a surgical procedure.

## 2022-04-02 NOTE — Discharge Instructions (Signed)
Follow up here or with your primary care provider if your symptoms are worsening or not improving.     

## 2022-04-02 NOTE — ED Provider Notes (Signed)
Roderic Palau    CSN: [id number] Arrival date & time: 04/02/22  1636      History   Chief Complaint Chief Complaint  Patient presents with   Eye Problem    HPI Allen David is a 88 y.o. male.    Eye Problem   Zentz to UC with complaint of right eye redness starting yesterday.Marland Kitchen  He states today, both eyes feel dry, burning, gritty feeling, blurry vision.  He suspects allergies but wanted to rule out "pinkeye".  Past Medical History:  Diagnosis Date   Anxiety    Back injury    with chronic pain   CAD in native artery    Chronic low back pain    Depression    ED (erectile dysfunction)    Elevated PSA    HLD (hyperlipidemia)    HTN (hypertension)    IBS (irritable bowel syndrome)    Malaise and fatigue    Pernicious anemia    Vitamin B deficiency     Patient Active Problem List   Diagnosis Date Noted   Adjustment disorder with anxiety 11/23/2020   History of hypothyroidism 11/09/2020   GERD (gastroesophageal reflux disease) 11/09/2020   Anemia 11/09/2020   Abnormal findings on diagnostic imaging of other parts of musculoskeletal system 10/31/2020   Acute renal failure ([place]) 10/31/2020   Hypothyroidism 10/31/2020   Unspecified abnormal findings in urine 10/31/2020   Onychomycosis 07/26/2020   Iron deficiency 07/26/2020   Medication monitoring encounter 07/26/2020   Nausea 04/26/2020   B12 deficiency 04/26/2020   Chronic left shoulder pain 11/16/2019   IBS (irritable bowel syndrome) 07/31/2019   Right leg pain 03/25/2019   Branch retinal vein occlusion 12/13/2018   Muscle cramps 11/03/2018   Difficulty hearing 09/20/2018   Leg pain 09/20/2018   Proteinuria 05/30/2018   Carpal tunnel syndrome 05/30/2018   Fatty liver 05/14/2018   Aortic atherosclerosis ([place]) 01/05/2018   Neuropathy 12/26/2017   Allergic rhinitis 09/16/2017   Muscular chest pain 08/21/2017   Adrenal adenoma 03/11/2017   Incidental lung nodule, > 29m and < 839m10/24/2018    Right foot pain 11/04/2016   Other specified abdominal hernia without obstruction or gangrene 11/04/2016   Skin lesion 01/01/2015   Arthralgia of both knees 09/04/2014   Pernicious anemia 12/20/2013   Hypertension 06/03/2012   Chronic low back pain 06/03/2012   Anxiety and depression 06/03/2012   Hyperlipidemia 01/20/2012   CAD, NATIVE VESSEL 01/09/2010    Past Surgical History:  Procedure Laterality Date   CARPAL TUNNEL RELEASE     EYE SURGERY     VASECTOMY         Home Medications    Prior to Admission medications   Medication Sig Start Date End Date Taking? Authorizing Provider  ALPRAZolam (XANAX) 0.5 MG tablet TAKE ONE TABLET BY MOUTH THREE TIMES A DAY FOR ANXIETY.  TAKE ONE-HALF TABLET AT BEDTIME IF ABLE 05/03/21   [provider]  amLODipine (NORVASC) 5 MG tablet Take 1 tablet (5 mg total) by mouth daily. 09/02/21   SoLeone HavenMD  aspirin 81 MG chewable tablet Chew 1 tablet by mouth 1 day or 1 dose. 08/16/10   [provider]  atorvastatin (LIPITOR) 20 MG tablet Take 1 tablet (20 mg total) by mouth daily. 01/05/18   GoMinna MerrittsMD  bismuth subsalicylate (PEPTO BISMOL) 262 MG/15ML suspension Take 30 mLs by mouth every 6 (six) hours as needed.    [provider]  cyanocobalamin (VITAMIN  B12) 1000 MCG/ML injection INJECT 1ML INTO THE MUSCLE EVERY 30 DAYS 03/06/22   Leone Haven, MD  FLUoxetine (PROZAC) 20 MG capsule TAKE TWO CAPSULES BY MOUTH IN THE MORNING FOR ANXIETY 05/03/21   [provider]  hydroxychloroquine (PLAQUENIL) 200 MG tablet Take 1 tablet (200 mg total) by mouth 2 (two) times daily. 09/02/21   Leone Haven, MD  magnesium hydroxide (MILK OF MAGNESIA) 400 MG/5ML suspension Take by mouth daily as needed for mild constipation. Patient not taking: Reported on 12/05/2020    [provider]  pantoprazole (PROTONIX) 40 MG tablet Take 1 tablet by mouth once daily 09/02/21   Leone Haven, MD   pantoprazole (PROTONIX) 40 MG tablet Take 1 tablet by mouth once daily 12/16/21   Dutch Quint B, FNP  SYRINGE-NEEDLE, DISP, 3 ML (LUER LOCK SAFETY SYRINGES) 25G X 1" 3 ML MISC Inject 1 mL into the muscle every 30 (thirty) days. 09/02/21   Leone Haven, MD    Family History Family History  Problem Relation Age of Onset   Stroke Mother    Hypertension Mother    Ulcers Mother    Cancer Father        multiple myeloma   Diabetes Sister    Diabetes Brother    Diabetes Brother     Social History Social History   Tobacco Use   Smoking status: Former    Types: Cigarettes    Quit date: 05/19/1966    Years since quitting: 55.9   Smokeless tobacco: Former   Tobacco comments:    Quit in [place] Use: Never used  Substance Use Topics   Alcohol use: Yes    Alcohol/week: 0.0 - 1.0 standard drinks of alcohol    Comment: daily   Drug use: No     Allergies   Lexapro [escitalopram oxalate] and Tetanus toxoids   Review of Systems Review of Systems   Physical Exam Triage Vital Signs ED Triage Vitals  Enc Vitals Group     BP 04/02/22 1728 (!) 157/89     Pulse Rate 04/02/22 1728 90     Resp 04/02/22 1728 17     Temp 04/02/22 1722 97.7 F (36.5 C)     Temp Source 04/02/22 1728 Oral     SpO2 04/02/22 1728 98 %     Weight 04/02/22 1723 160 lb (72.6 kg)     Height 04/02/22 1723 6' (1.829 m)     Head Circumference --      Peak Flow --      Pain Score 04/02/22 1724 8     Pain Loc --      Pain Edu? --      Excl. in [place]? --    No data found.  Updated Vital Signs BP (!) 157/89 (BP Location: Left Arm)   Pulse 90   Temp 97.8 F (36.6 C) (Oral)   Resp 17   Ht 6' (1.829 m)   Wt 160 lb (72.6 kg)   SpO2 98%   BMI 21.70 kg/m   Visual Acuity Right Eye Distance:   Left Eye Distance:   Bilateral Distance:    Right Eye Near:   Left Eye Near:    Bilateral Near:     Physical Exam Vitals reviewed.  Constitutional:      Appearance: Normal appearance.   Eyes:     General:        Right eye: Discharge present.  Left eye: Discharge present.    Conjunctiva/sclera:     Right eye: Right conjunctiva is injected.     Left eye: Left conjunctiva is injected.     Comments: Conjunctival injection and watery discharge bilaterally.  Skin:    General: Skin is warm and dry.  Neurological:     General: No focal deficit present.     Mental Status: He is alert and oriented to person, place, and time.  Psychiatric:        Mood and Affect: Mood normal.        Behavior: Behavior normal.      UC Treatments / Results  Labs (all labs ordered are listed, but only abnormal results are displayed) Labs Reviewed - No data to display  EKG   Radiology No results found.  Procedures Procedures (including critical care time)  Medications Ordered in UC Medications - No data to display  Initial Impression / Assessment and Plan / UC Course  I have reviewed the triage vital signs and the nursing notes.  Pertinent labs & imaging results that were available during my care of the patient were reviewed by me and considered in my medical decision making (see chart for details).   Treating allergic conjunctivitis with Pataday drops.   Final Clinical Impressions(s) / UC Diagnoses   Final diagnoses:  None   Discharge Instructions   None    ED Prescriptions   None    PDMP not reviewed this encounter.   Rose Phi, [place] 04/02/22 1753

## 2022-04-28 ENCOUNTER — Ambulatory Visit: Payer: Medicare Other | Admitting: Family Medicine

## 2022-07-25 ENCOUNTER — Telehealth: Payer: Self-pay

## 2022-07-25 NOTE — Telephone Encounter (Signed)
Patient's daughter, [name], states patient's doctor at the [facility] would like to have patient's iron checked at his next visit with Dr. [name] on 08/01/2022 because it is so low. Also, daughter states she would like for him to have a urine sample to check for blood in the urine. Patient is having an injection in his back and the urine sample would be related to this injection.

## 2022-07-28 NOTE — Telephone Encounter (Signed)
We can discuss checking these things at his visit.

## 2022-08-01 ENCOUNTER — Encounter: Payer: Self-pay | Admitting: Family Medicine

## 2022-08-01 ENCOUNTER — Ambulatory Visit (INDEPENDENT_AMBULATORY_CARE_PROVIDER_SITE_OTHER): Payer: Medicare Other | Admitting: Family Medicine

## 2022-08-01 VITALS — BP 132/86 | HR 113 | Temp 98.1°F | Ht 72.0 in | Wt 158.6 lb

## 2022-08-01 DIAGNOSIS — K219 Gastro-esophageal reflux disease without esophagitis: Secondary | ICD-10-CM

## 2022-08-01 DIAGNOSIS — F419 Anxiety disorder, unspecified: Secondary | ICD-10-CM

## 2022-08-01 DIAGNOSIS — I10 Essential (primary) hypertension: Secondary | ICD-10-CM | POA: Diagnosis not present

## 2022-08-01 DIAGNOSIS — F32A Depression, unspecified: Secondary | ICD-10-CM

## 2022-08-01 DIAGNOSIS — G8929 Other chronic pain: Secondary | ICD-10-CM

## 2022-08-01 DIAGNOSIS — D649 Anemia, unspecified: Secondary | ICD-10-CM | POA: Diagnosis not present

## 2022-08-01 DIAGNOSIS — E611 Iron deficiency: Secondary | ICD-10-CM | POA: Diagnosis not present

## 2022-08-01 DIAGNOSIS — E538 Deficiency of other specified B group vitamins: Secondary | ICD-10-CM

## 2022-08-01 DIAGNOSIS — E782 Mixed hyperlipidemia: Secondary | ICD-10-CM | POA: Diagnosis not present

## 2022-08-01 DIAGNOSIS — M5442 Lumbago with sciatica, left side: Secondary | ICD-10-CM

## 2022-08-01 LAB — POCT URINALYSIS DIPSTICK
Blood, UA: NEGATIVE
Glucose, UA: NEGATIVE
Ketones, UA: NEGATIVE
Leukocytes, UA: NEGATIVE
Nitrite, UA: NEGATIVE
Protein, UA: POSITIVE — AB
Spec Grav, UA: 1.02 (ref 1.010–1.025)
Urobilinogen, UA: 1 E.U./dL
pH, UA: 5.5 (ref 5.0–8.0)

## 2022-08-01 MED ORDER — BUSPIRONE HCL 7.5 MG PO TABS: 7.5000 mg | ORAL_TABLET | Freq: Two times a day (BID) | ORAL | 2 refills | Status: DC

## 2022-08-01 NOTE — Assessment & Plan Note (Signed)
Chronic issue.  I encouraged him to continue to see his specialist.

## 2022-08-01 NOTE — Patient Instructions (Signed)
Nice to see you. We will get lab work today and contact you with the results. I am going to start on buspirone 7.5 mg twice daily for your anxiety.  You will continue your Cymbalta.  If you notice any side effects with the buspirone please let me know.

## 2022-08-01 NOTE — Assessment & Plan Note (Signed)
Check iron levels.  Check CBC.  Check UA to confirm he is not having any blood in his urine.

## 2022-08-01 NOTE — Assessment & Plan Note (Signed)
Chronic issue.  He will continue Cymbalta 60 mg daily.  Will start buspirone 7.5 mg twice daily.  Advised to monitor for side effects with this.  Discussed it may take quite some time for this to become beneficial.  If he has side effects he will let us know.

## 2022-08-01 NOTE — Assessment & Plan Note (Signed)
Chronic issue.  Patient will continue Protonix 40 mg daily.

## 2022-08-01 NOTE — Progress Notes (Unsigned)
[name], MD Phone: [phone]  Allen David is a 89 y.o. male who presents today for follow-up.  Iron deficiency: Patient reports iron deficiency through his [place] lab work.  His daughter notes that they wanted his labs rechecked during this visit.  Patient notes no bleeding issues.  Hypertension: Has been down as low as 104/68 though it does jump up at times.  No chest pain or shortness of breath, or edema.  GERD: Taking Protonix.  No reflux symptoms.  Low back pain: This is a chronic issue.  He has gotten 3 injections.  He reports he has spinal stenosis.  He follows up with his specialist at the [facility] for further steps.  Denies dysuria and urinary frequency.  Anxiety/depression: This is a chronic issue.  He is on Cymbalta 60 mg daily.  [facility] tapered him off of Xanax and tried clonazepam though was not helpful so he discontinued it.  He reports 1-2 panic attacks a day.  He notes occasional depression.  No SI.  Social History   Tobacco Use  Smoking Status Former   Types: Cigarettes   Quit date: 05/19/1966   Years since quitting: 56.2  Smokeless Tobacco Former  Tobacco Comments   Quit in 1968    Current Outpatient Medications on File Prior to Visit  Medication Sig Dispense Refill   amLODipine (NORVASC) 5 MG tablet Take 1 tablet (5 mg total) by mouth daily. 90 tablet 0   aspirin 81 MG chewable tablet Chew 1 tablet by mouth 1 day or 1 dose.     atorvastatin (LIPITOR) 20 MG tablet Take 1 tablet (20 mg total) by mouth daily. 90 tablet 3   bismuth subsalicylate (PEPTO BISMOL) 262 MG/15ML suspension Take 30 mLs by mouth every 6 (six) hours as needed.     cyanocobalamin (VITAMIN B12) 1000 MCG/ML injection INJECT 1ML INTO THE MUSCLE EVERY 30 DAYS 12 mL 0   hydroxychloroquine (PLAQUENIL) 200 MG tablet Take 1 tablet (200 mg total) by mouth 2 (two) times daily. 60 tablet 1   Olopatadine HCl 0.2 % SOLN Apply 1 drop to eye daily. 2.5 mL 0   pantoprazole (PROTONIX) 40 MG  tablet Take 1 tablet by mouth once daily 30 tablet 0   pantoprazole (PROTONIX) 40 MG tablet Take 1 tablet by mouth once daily 30 tablet 0   SYRINGE-NEEDLE, DISP, 3 ML (LUER LOCK SAFETY SYRINGES) 25G X 1" 3 ML MISC Inject 1 mL into the muscle every 30 (thirty) days. 12 each 0   Current Facility-Administered Medications on File Prior to Visit  Medication Dose Route Frequency Provider Last Rate Last Admin   cyanocobalamin ((VITAMIN B-12)) injection 1,000 mcg  1,000 mcg Intramuscular Once Leone Haven, MD         ROS see history of present illness  Objective  Physical Exam Vitals:   08/01/22 1607  BP: 132/86  Pulse: (!) 113  Temp: 98.1 F (36.7 C)  SpO2: 99%    BP Readings from Last 3 Encounters:  08/01/22 132/86  04/02/22 (!) 157/89  07/04/21 136/61   Wt Readings from Last 3 Encounters:  08/01/22 158 lb 9.6 oz (71.9 kg)  04/02/22 160 lb (72.6 kg)  07/04/21 169 lb 1.5 oz (76.7 kg)    Physical Exam Constitutional:      General: He is not in acute distress.    Appearance: He is not diaphoretic.  Cardiovascular:     Rate and Rhythm: Normal rate and regular rhythm.  Heart sounds: Normal heart sounds.  Pulmonary:     Effort: Pulmonary effort is normal.     Breath sounds: Normal breath sounds.  Skin:    General: Skin is warm and dry.  Neurological:     Mental Status: He is alert.      Assessment/Plan: Please see individual problem list.  Primary hypertension Assessment & Plan: Chronic issue.  Well-controlled today.  Patient will continue amlodipine 5 mg daily.   Mixed hyperlipidemia  Anxiety and depression Assessment & Plan: Chronic issue.  He will continue Cymbalta 60 mg daily.  Will start buspirone 7.5 mg twice daily.  Advised to monitor for side effects with this.  Discussed it may take quite some time for this to become beneficial.  If he has side effects he will let us know.  Orders: -     busPIRone HCl; Take 1 tablet (7.5 mg total) by mouth 2  (two) times daily.  Dispense: 60 tablet; Refill: 2  Iron deficiency Assessment & Plan: Check iron levels.  Check CBC.  Check UA to confirm he is not having any blood in his urine.  Orders: -     CBC -     Iron, TIBC and Ferritin Panel -     POCT urinalysis dipstick  B12 deficiency  Anemia, unspecified type -     CBC -     Iron, TIBC and Ferritin Panel -     POCT urinalysis dipstick  Chronic bilateral low back pain with left-sided sciatica Assessment & Plan: Chronic issue.  I encouraged him to continue to see his specialist.   Gastroesophageal reflux disease, unspecified whether esophagitis present Assessment & Plan: Chronic issue.  Patient will continue Protonix 40 mg daily.      Return in about 3 months (around 11/01/2022) for Anxiety.   [name], MD [place]

## 2022-08-01 NOTE — Assessment & Plan Note (Signed)
Chronic issue.  Well-controlled today.  Patient will continue amlodipine 5 mg daily.

## 2022-08-02 LAB — CBC
HCT: 33.3 % — ABNORMAL LOW (ref 38.5–50.0)
Hemoglobin: 10.9 g/dL — ABNORMAL LOW (ref 13.2–17.1)
MCH: 30.4 pg (ref 27.0–33.0)
MCHC: 32.7 g/dL (ref 32.0–36.0)
MCV: 92.8 fL (ref 80.0–100.0)
MPV: 11.2 fL (ref 7.5–12.5)
Platelets: 134 10*3/uL — ABNORMAL LOW (ref 140–400)
RBC: 3.59 10*6/uL — ABNORMAL LOW (ref 4.20–5.80)
RDW: 14.1 % (ref 11.0–15.0)
WBC: 6 10*3/uL (ref 3.8–10.8)

## 2022-08-02 LAB — IRON,TIBC AND FERRITIN PANEL
%SAT: 14 % (calc) — ABNORMAL LOW (ref 20–48)
Ferritin: 427 ng/mL — ABNORMAL HIGH (ref 24–380)
Iron: 40 ug/dL — ABNORMAL LOW (ref 50–180)
TIBC: 283 mcg/dL (calc) (ref 250–425)

## 2022-08-04 ENCOUNTER — Telehealth: Payer: Self-pay

## 2022-08-04 NOTE — Telephone Encounter (Signed)
Left message to call the office back regarding his labs

## 2022-08-04 NOTE — Telephone Encounter (Signed)
-----   Message from Leone Haven, MD sent at 08/04/2022 10:51 AM EDT ----- Please let the patient know that his anemia is relatively stable compared to our prior labs.  He does not have any blood in his urine.  His iron does remain a little low though his ferritin which is an indicator of iron stores is a little elevated.  This may be elevated as a reactant to some inflammation in his body.  I would like to have him see GI to see if he needs workup for iron deficiency through GI.  He did have some protein in his urine and we will plan on rechecking that at his next visit.

## 2022-08-04 NOTE — Telephone Encounter (Signed)
Spoke to Patient and his daughter regarding his lab results and going to GI for a more extensive work up

## 2022-08-04 NOTE — Telephone Encounter (Signed)
Pt returned [place] call. Note below was read to him and was transferred to [facility] as well.

## 2022-08-05 ENCOUNTER — Telehealth: Payer: Self-pay

## 2022-08-05 NOTE — Telephone Encounter (Signed)
Left message to call the office back.

## 2022-08-05 NOTE — Telephone Encounter (Signed)
-----   Message from Leone Haven, MD sent at 08/04/2022  3:40 PM EDT ----- Noted. We can monitor then. It would be a good idea for him to start an iron supplement if he is not already on one. I can send this to his pharmacy once you speak with him.

## 2022-08-06 ENCOUNTER — Telehealth: Payer: Self-pay

## 2022-08-06 ENCOUNTER — Other Ambulatory Visit: Payer: Self-pay

## 2022-08-06 ENCOUNTER — Other Ambulatory Visit: Payer: Self-pay | Admitting: Family Medicine

## 2022-08-06 MED ORDER — FERROUS SULFATE 325 (65 FE) MG PO TABS: 325.0000 mg | ORAL_TABLET | Freq: Every day | ORAL | 1 refills | Status: DC

## 2022-08-06 NOTE — Telephone Encounter (Signed)
Left message to call the office back regarding Dr. Sonnenberg's recommendations 

## 2022-08-06 NOTE — Telephone Encounter (Signed)
-----   Message from Leone Haven, MD sent at 08/04/2022  3:40 PM EDT ----- Noted. We can monitor then. It would be a good idea for him to start an iron supplement if he is not already on one. I can send this to his pharmacy once you speak with him.

## 2022-10-22 IMAGING — CR DG CHEST 2V
1 series · 2 of 2 positions shown · non-contrast
Comparison: 04/25/2019

CLINICAL DATA: Shortness of breath

EXAM:
CHEST - 2 VIEW

[Series 1: dg chest 2 view · 0.14mm/px · 2 of 2 slices shown]
[im 1/2]
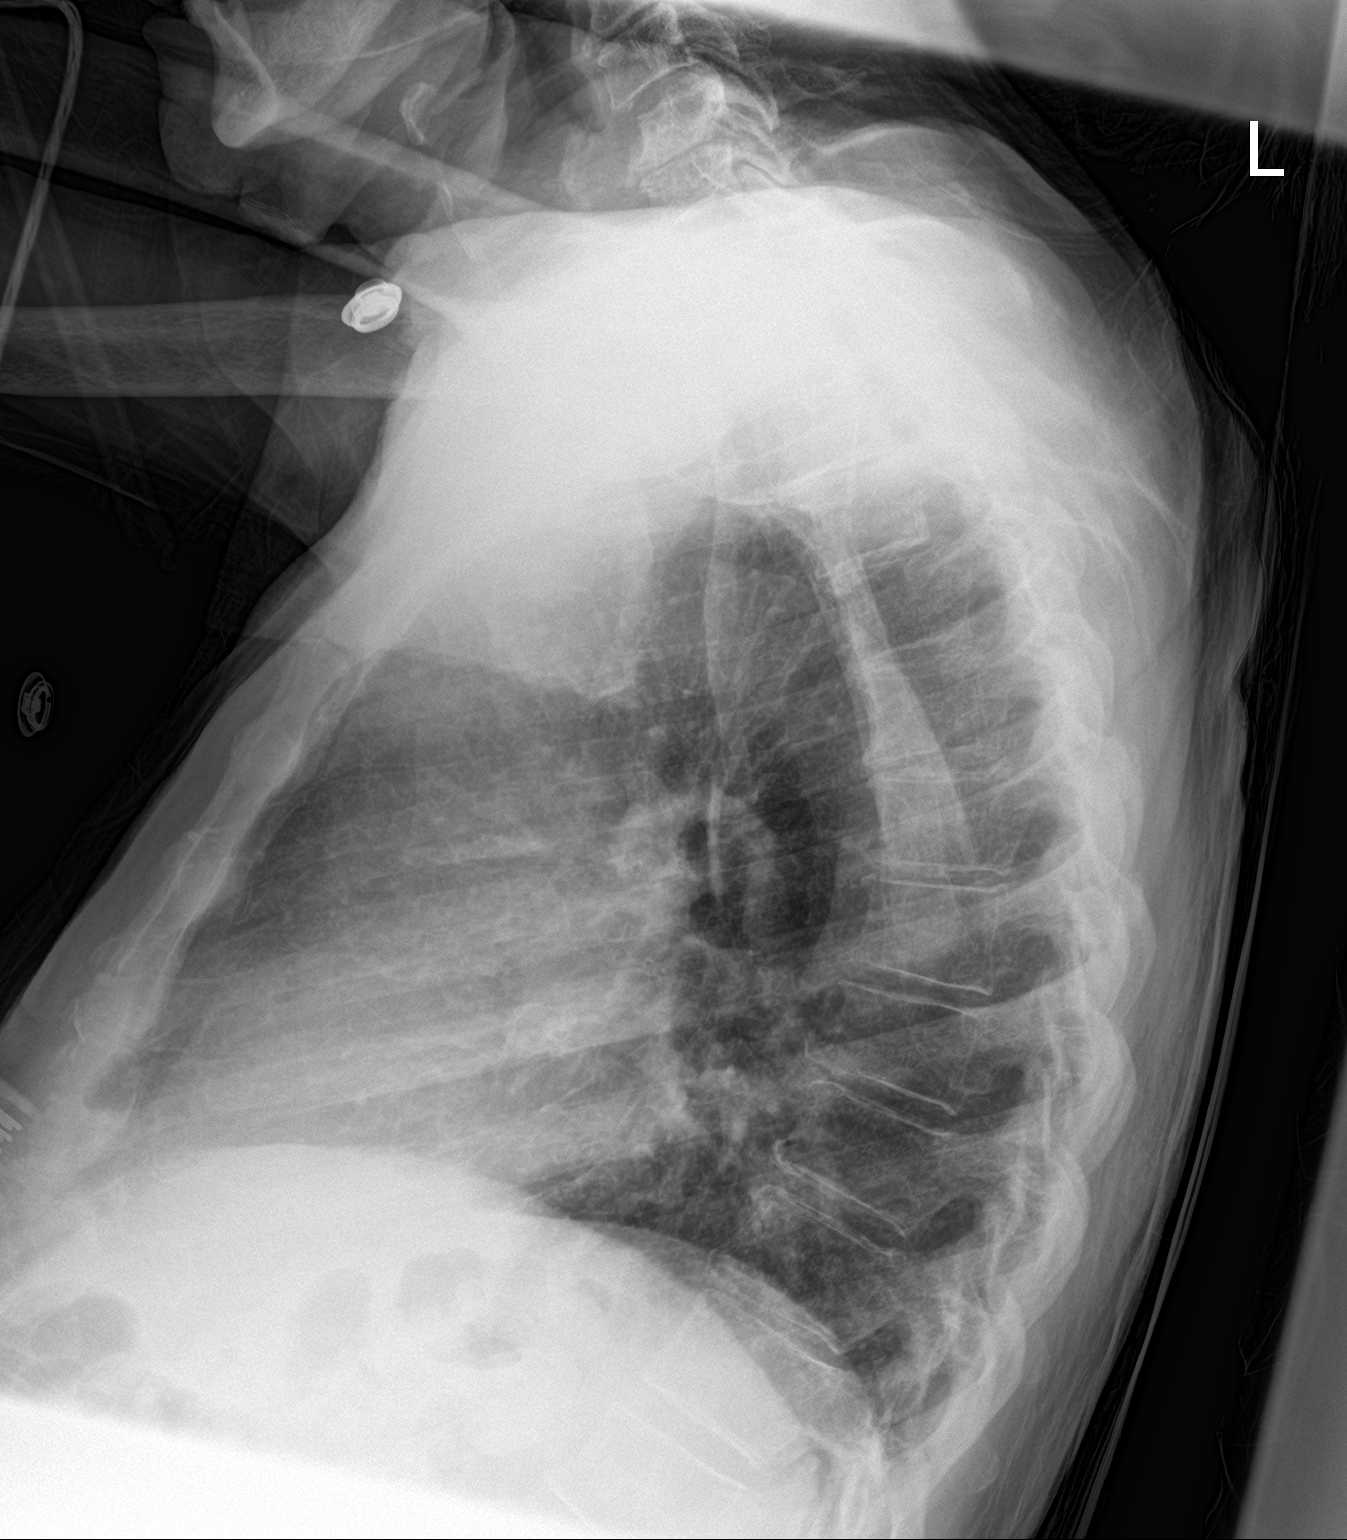
[im 2/2]
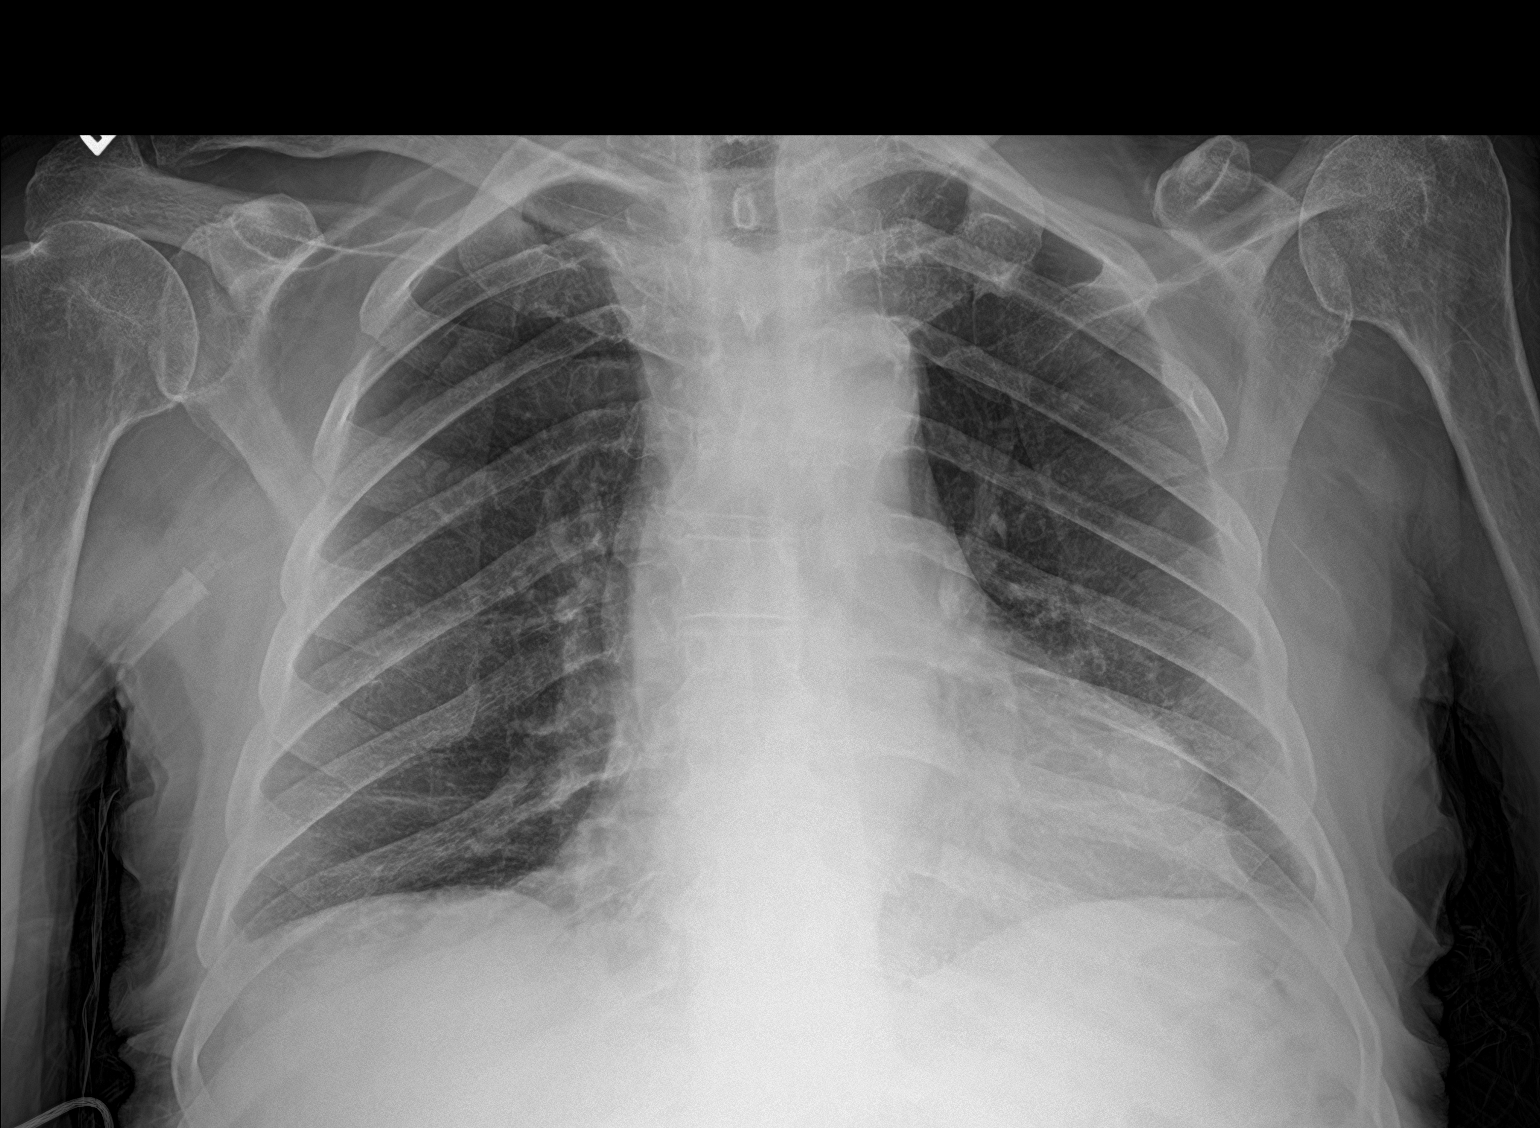

[2 of 2 positions shown; findings below may reference images not displayed]

FINDINGS: The heart size and mediastinal contours are within normal limits.
Both lungs are clear. The visualized skeletal structures are
unremarkable.
IMPRESSION: No active cardiopulmonary disease.

## 2022-10-22 IMAGING — CT CT HEAD W/O CM
4 series · 16 of 47 positions shown, 18 images · non-contrast
Comparison: 07/10/2015

CLINICAL DATA: Anxiety, confusion



[Series 2: head wo · axial · 0.46mm/px · z∈[+390,+510]mm · 6 of 34 slices shown, 8 images]
[im 5/34  brain]
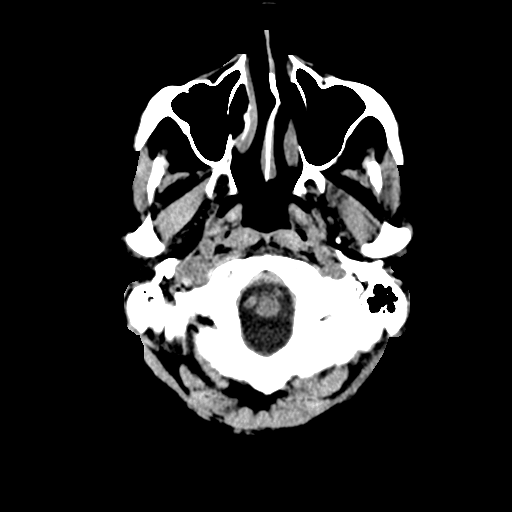
[im 5/34  bone]
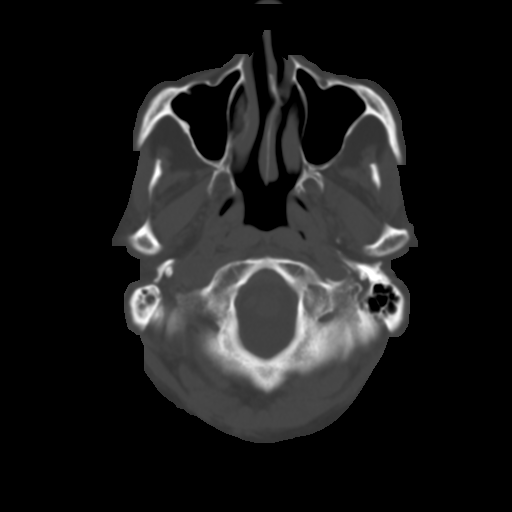
[im 10/34  brain]
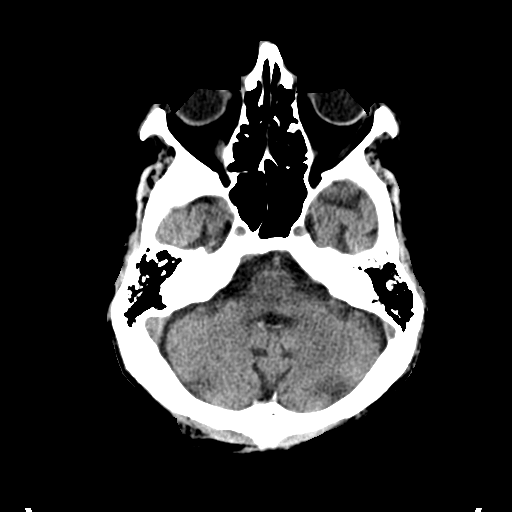
[im 15/34  brain]
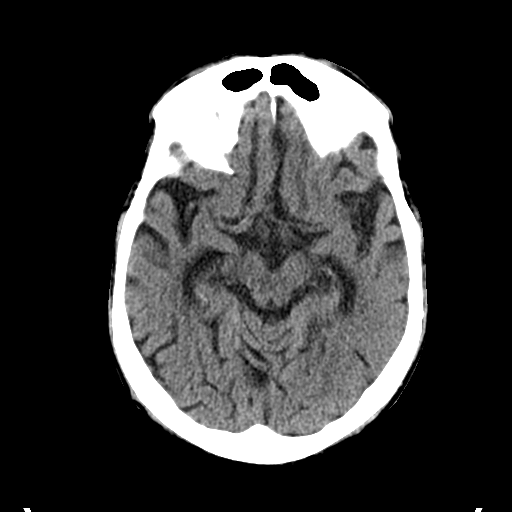
[im 19/34  brain]
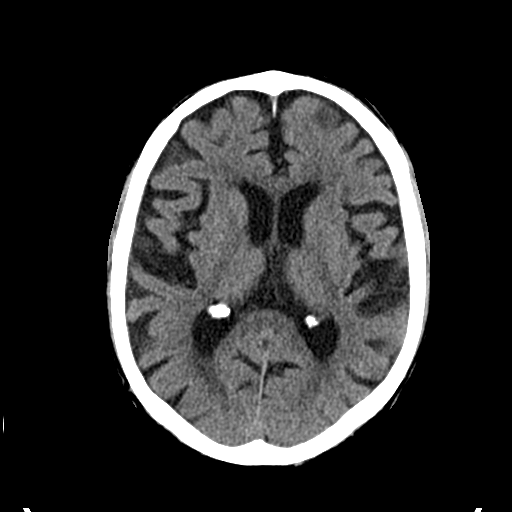
[im 24/34  brain]
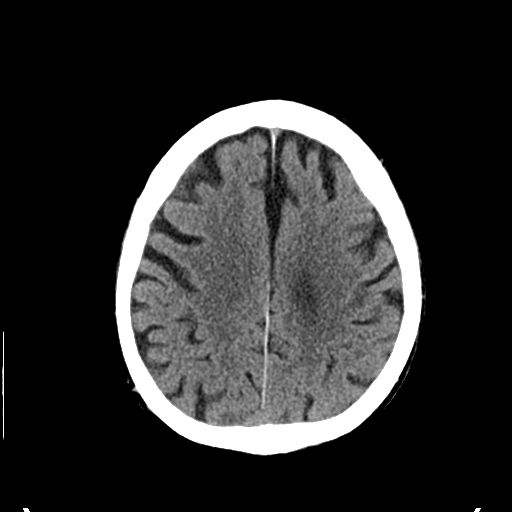
[im 24/34  bone]
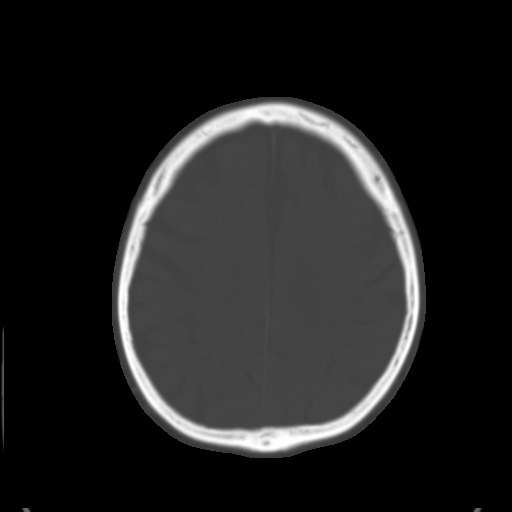
[im 29/34  brain]
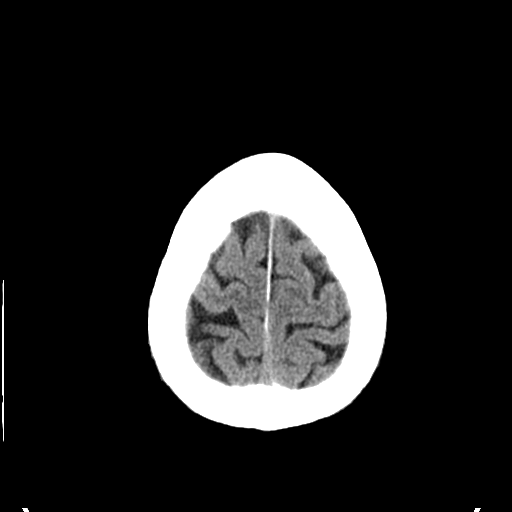

[Series 3: head bone · axial · 0.46mm/px · z∈[+386,+442]mm · 4 of 87 slices shown]
[im 9/87  bone]
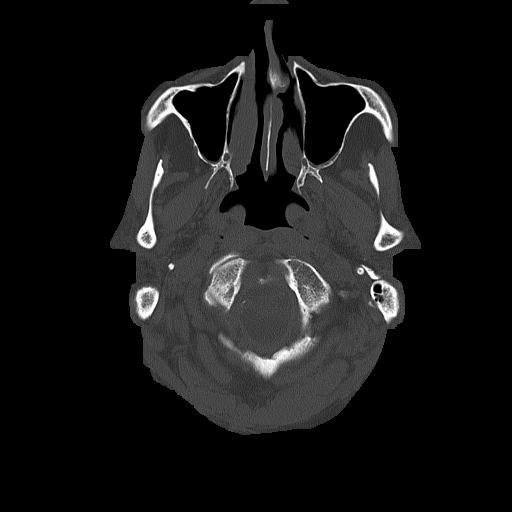
[im 17/87  bone]
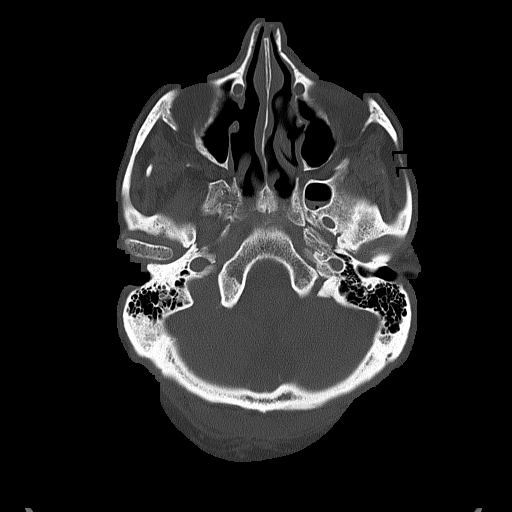
[im 29/87  bone]
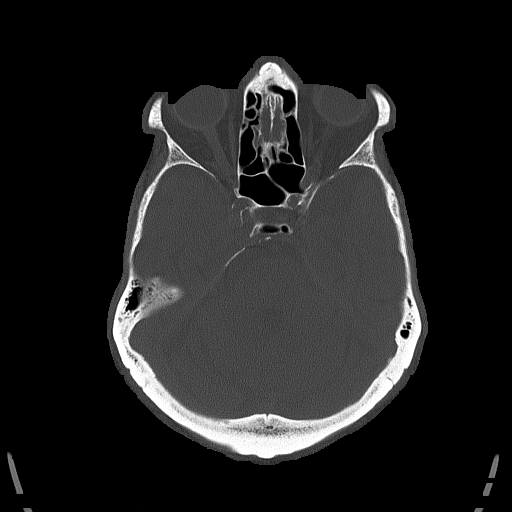
[im 37/87  bone]
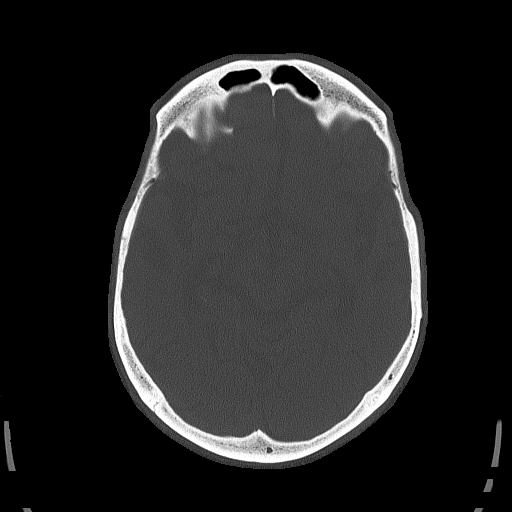

[Series 4: coronal soft tissue · coronal · 0.34mm/px · 3 of 73 slices shown]
[im 25/73  brain]
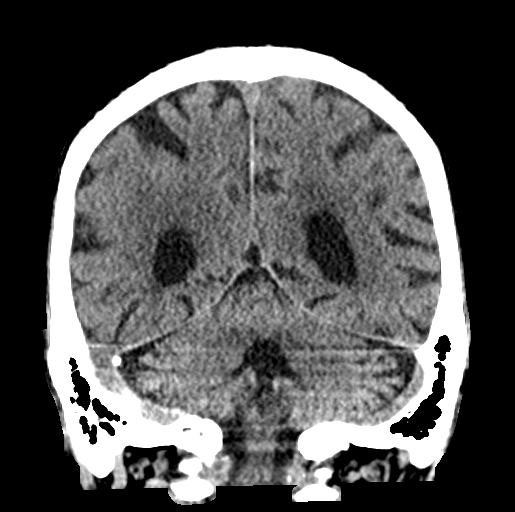
[im 33/73  brain]
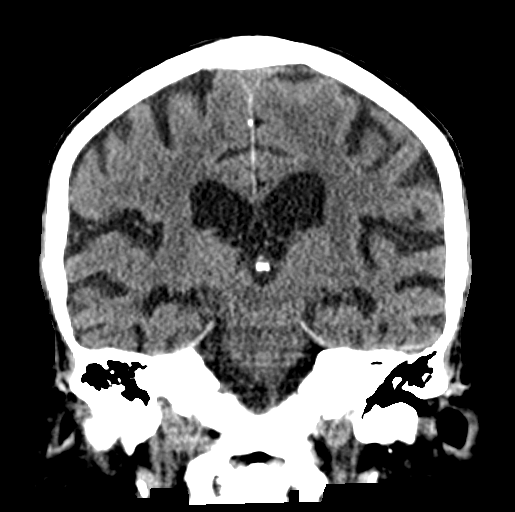
[im 41/73  brain]
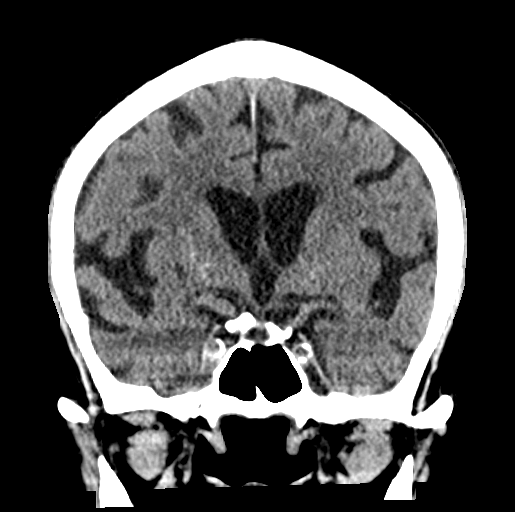

[Series 5: sagittal soft tissue · sagittal · 0.35mm/px · 3 of 59 slices shown]
[im 20/59  brain]
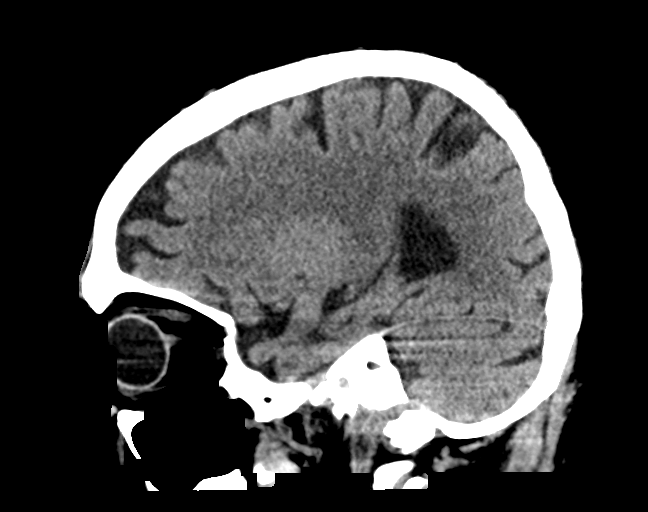
[im 30/59  brain]
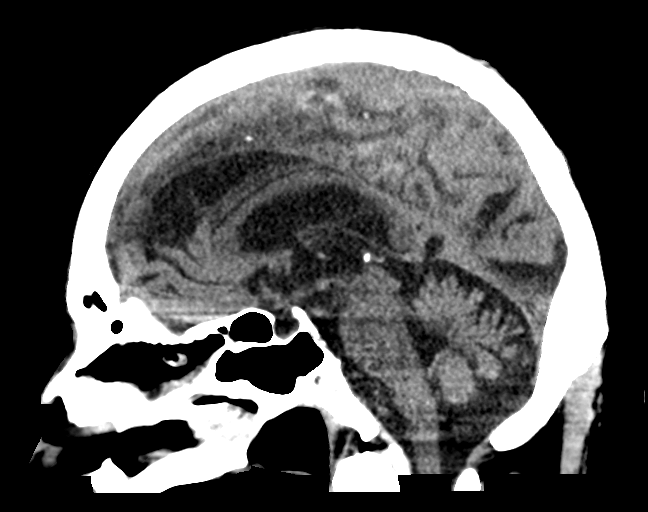
[im 39/59  brain]
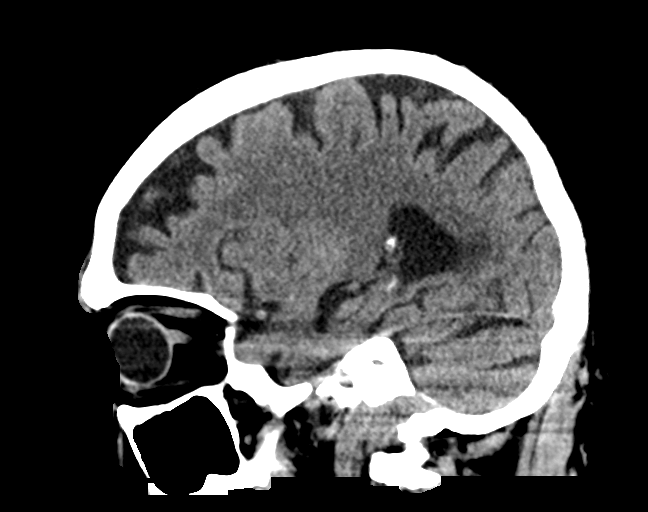

[16 of 47 positions shown; findings below may reference images not displayed]

FINDINGS: Brain: No evidence of acute infarction, hemorrhage, hydrocephalus,
extra-axial collection or mass lesion/mass effect.

Global cortical atrophy.

Subcortical white matter and periventricular small vessel ischemic
changes.

Vascular: Intracranial atherosclerosis.

Skull: Normal. Negative for fracture or focal lesion.

Sinuses/Orbits: The visualized paranasal sinuses are essentially
clear. The mastoid air cells are unopacified.

Other: None.
IMPRESSION: No evidence of acute intracranial abnormality.

Atrophy with small vessel ischemic changes.

## 2022-10-31 ENCOUNTER — Ambulatory Visit: Payer: Medicare Other | Admitting: Family Medicine

## 2022-11-21 ENCOUNTER — Encounter: Payer: Self-pay | Admitting: Family Medicine

## 2022-11-21 ENCOUNTER — Ambulatory Visit (INDEPENDENT_AMBULATORY_CARE_PROVIDER_SITE_OTHER): Payer: Medicare Other | Admitting: Family Medicine

## 2022-11-21 VITALS — BP 112/66 | HR 92 | Temp 97.9°F | Ht 72.0 in | Wt 171.0 lb

## 2022-11-21 DIAGNOSIS — F419 Anxiety disorder, unspecified: Secondary | ICD-10-CM | POA: Diagnosis not present

## 2022-11-21 DIAGNOSIS — E782 Mixed hyperlipidemia: Secondary | ICD-10-CM

## 2022-11-21 DIAGNOSIS — I10 Essential (primary) hypertension: Secondary | ICD-10-CM

## 2022-11-21 DIAGNOSIS — D510 Vitamin B12 deficiency anemia due to intrinsic factor deficiency: Secondary | ICD-10-CM | POA: Diagnosis not present

## 2022-11-21 DIAGNOSIS — F32A Depression, unspecified: Secondary | ICD-10-CM

## 2022-11-21 DIAGNOSIS — E611 Iron deficiency: Secondary | ICD-10-CM

## 2022-11-21 MED ORDER — FERROUS SULFATE 325 (65 FE) MG PO TABS: 325.0000 mg | ORAL_TABLET | Freq: Every day | ORAL | 1 refills | Status: DC

## 2022-11-21 MED ORDER — AMLODIPINE BESYLATE 5 MG PO TABS: 5.0000 mg | ORAL_TABLET | Freq: Every day | ORAL | 0 refills | Status: DC

## 2022-11-21 MED ORDER — BUSPIRONE HCL 7.5 MG PO TABS: 7.5000 mg | ORAL_TABLET | Freq: Two times a day (BID) | ORAL | 2 refills | Status: DC

## 2022-11-21 NOTE — Assessment & Plan Note (Signed)
Chronic issue.  Patient has been getting B12 injections at home.  Will recheck his B12 levels today.

## 2022-11-21 NOTE — Assessment & Plan Note (Signed)
Chronic issue.  Well-controlled.  He will continue amlodipine 5 mg daily.

## 2022-11-21 NOTE — Assessment & Plan Note (Signed)
Chronic issue.  Check iron levels and CBC.  Continue to monitor.  Daughter previously reported in the past he had seen GI and was determined to be too high risk to proceed with workup for this.

## 2022-11-21 NOTE — Progress Notes (Signed)
Marikay Alar, MD Phone: [phone]  Allen David is a 89 y.o. male who presents today for f/u  HYPERTENSION Disease Monitoring Home BP Monitoring 104-130/60-78 Chest pain- no    Dyspnea- no Medications Compliance-  taking amlodipine.  Edema- nothing consistent, notes he had foot swelling on one side on one occasion though that resolved on its own BMET    Component Value Date/Time   NA 140 06/16/2021 2140   K 3.7 06/16/2021 2140   CL 108 06/16/2021 2140   CO2 22 06/16/2021 2140   GLUCOSE 91 06/16/2021 2140   BUN 21 06/16/2021 2140   CREATININE 1.00 06/16/2021 2140   CALCIUM 8.8 (L) 06/16/2021 2140   GFRNONAA >60 06/16/2021 2140   Iron deficiency anemia: Patient has no bleeding.  He has been taking over-the-counter iron.  B12 deficiency: He has been getting B12 injections at home once every 1 to 2 months.  Social History   Tobacco Use  Smoking Status Former   Types: Cigarettes   Quit date: 05/19/1966   Years since quitting: 56.5  Smokeless Tobacco Former  Tobacco Comments   Quit in 1968    Current Outpatient Medications on File Prior to Visit  Medication Sig Dispense Refill   aspirin 81 MG chewable tablet Chew 1 tablet by mouth 1 day or 1 dose.     atorvastatin (LIPITOR) 20 MG tablet Take 1 tablet (20 mg total) by mouth daily. 90 tablet 3   bismuth subsalicylate (PEPTO BISMOL) 262 MG/15ML suspension Take 30 mLs by mouth every 6 (six) hours as needed.     cyanocobalamin (VITAMIN B12) 1000 MCG/ML injection INJECT INTO THE MUSCLE EVERY 30 DAYS 12 mL 0   hydroxychloroquine (PLAQUENIL) 200 MG tablet Take 1 tablet (200 mg total) by mouth 2 (two) times daily. 60 tablet 1   Olopatadine HCl 0.2 % SOLN Apply 1 drop to eye daily. 2.5 mL 0   pantoprazole (PROTONIX) 40 MG tablet Take 1 tablet by mouth once daily 30 tablet 0   pantoprazole (PROTONIX) 40 MG tablet Take 1 tablet by mouth once daily 30 tablet 0   SYRINGE-NEEDLE, DISP, 3 ML (LUER LOCK SAFETY SYRINGES) 25G X  1" 3 ML MISC Inject 1 mL into the muscle every 30 (thirty) days. 12 each 0   Current Facility-Administered Medications on File Prior to Visit  Medication Dose Route Frequency Provider Last Rate Last Admin   cyanocobalamin ((VITAMIN B-12)) injection 1,000 mcg  1,000 mcg Intramuscular Once Glori Luis, MD         ROS see history of present illness  Objective  Physical Exam Vitals:   11/21/22 1450  BP: 112/66  Pulse: 92  Temp: 97.9 F (36.6 C)  SpO2: 99%    BP Readings from Last 3 Encounters:  11/21/22 112/66  08/01/22 132/86  04/02/22 (!) 157/89   Wt Readings from Last 3 Encounters:  11/21/22 171 lb (77.6 kg)  08/01/22 158 lb 9.6 oz (71.9 kg)  04/02/22 160 lb (72.6 kg)    Physical Exam Constitutional:      General: He is not in acute distress.    Appearance: He is not diaphoretic.  Cardiovascular:     Rate and Rhythm: Normal rate and regular rhythm.     Heart sounds: Normal heart sounds.  Pulmonary:     Effort: Pulmonary effort is normal.     Breath sounds: Normal breath sounds.  Musculoskeletal:     Right lower leg: No edema.     Left lower  leg: No edema.  Skin:    General: Skin is warm and dry.  Neurological:     Mental Status: He is alert.      Assessment/Plan: Please see individual problem list.  Primary hypertension Assessment & Plan: Chronic issue.  Well-controlled.  He will continue amlodipine 5 mg daily.  Orders: -     amLODIPine Besylate; Take 1 tablet (5 mg total) by mouth daily.  Dispense: 90 tablet; Refill: 0  Mixed hyperlipidemia -     Lipid panel -     Comprehensive metabolic panel  Anxiety and depression -     busPIRone HCl; Take 1 tablet (7.5 mg total) by mouth 2 (two) times daily.  Dispense: 60 tablet; Refill: 2  Pernicious anemia Assessment & Plan: Chronic issue.  Patient has been getting B12 injections at home.  Will recheck his B12 levels today.  Orders: -     Vitamin B12  Iron deficiency Assessment &  Plan: Chronic issue.  Check iron levels and CBC.  Continue to monitor.  Daughter previously reported in the past he had seen GI and was determined to be too high risk to proceed with workup for this.  Orders: -     Ferrous Sulfate; Take 1 tablet (325 mg total) by mouth daily with breakfast.  Dispense: 30 tablet; Refill: 1 -     CBC -     Iron, TIBC and Ferritin Panel     Return in about 6 months (around 05/24/2023).   Marikay Alar, MD [facility]

## 2022-11-22 LAB — COMPREHENSIVE METABOLIC PANEL
AG Ratio: 1.7 (calc) (ref 1.0–2.5)
ALT: 13 U/L (ref 9–46)
AST: 20 U/L (ref 10–35)
Albumin: 4 g/dL (ref 3.6–5.1)
Alkaline phosphatase (APISO): 56 U/L (ref 35–144)
BUN/Creatinine Ratio: 26 (calc) — ABNORMAL HIGH (ref 6–22)
BUN: 31 mg/dL — ABNORMAL HIGH (ref 7–25)
CO2: 22 mmol/L (ref 20–32)
Calcium: 9 mg/dL (ref 8.6–10.3)
Chloride: 110 mmol/L (ref 98–110)
Creat: 1.2 mg/dL (ref 0.70–1.22)
Globulin: 2.4 g/dL (calc) (ref 1.9–3.7)
Glucose, Bld: 86 mg/dL (ref 65–99)
Potassium: 4.6 mmol/L (ref 3.5–5.3)
Sodium: 142 mmol/L (ref 135–146)
Total Bilirubin: 0.6 mg/dL (ref 0.2–1.2)
Total Protein: 6.4 g/dL (ref 6.1–8.1)

## 2022-11-22 LAB — CBC
HCT: 32.9 % — ABNORMAL LOW (ref 38.5–50.0)
Hemoglobin: 10.6 g/dL — ABNORMAL LOW (ref 13.2–17.1)
MCH: 30.5 pg (ref 27.0–33.0)
MCHC: 32.2 g/dL (ref 32.0–36.0)
MCV: 94.5 fL (ref 80.0–100.0)
MPV: 10.6 fL (ref 7.5–12.5)
Platelets: 85 10*3/uL — ABNORMAL LOW (ref 140–400)
RBC: 3.48 10*6/uL — ABNORMAL LOW (ref 4.20–5.80)
RDW: 14.3 % (ref 11.0–15.0)
WBC: 4 10*3/uL (ref 3.8–10.8)

## 2022-11-22 LAB — LIPID PANEL
Cholesterol: 115 mg/dL (ref <200)
HDL: 48 mg/dL (ref >=40)
LDL Cholesterol (Calc): 52 mg/dL (calc)
Non-HDL Cholesterol (Calc): 67 mg/dL (calc) (ref <130)
Total CHOL/HDL Ratio: 2.4 (calc) (ref <5.0)
Triglycerides: 69 mg/dL (ref <150)

## 2022-11-22 LAB — VITAMIN B12: Vitamin B-12: 292 pg/mL (ref 200–1100)

## 2022-11-22 LAB — IRON,TIBC AND FERRITIN PANEL
%SAT: 29 % (calc) (ref 20–48)
Ferritin: 342 ng/mL (ref 24–380)
Iron: 72 ug/dL (ref 50–180)
TIBC: 251 mcg/dL (calc) (ref 250–425)

## 2022-12-01 ENCOUNTER — Telehealth: Payer: Self-pay

## 2022-12-01 NOTE — Telephone Encounter (Signed)
-----   Message from Marikay Alar sent at 11/24/2022 12:17 PM EDT ----- Please let the patient know that his iron levels have improved.  His B12 does remain borderline low.  They need to make sure he consistently gets the B12 injections once monthly.  His platelets have dropped some from his prior baseline.  Does he still follow with hematology?  If he does not I would recommend he follow-up with them again.

## 2022-12-01 NOTE — Telephone Encounter (Signed)
Left message to call the office back regarding lab results below. 

## 2022-12-02 ENCOUNTER — Telehealth: Payer: Self-pay

## 2022-12-02 NOTE — Telephone Encounter (Signed)
Left message to call the office back regarding lab results below. 

## 2022-12-02 NOTE — Telephone Encounter (Signed)
-----   Message from Marikay Alar sent at 11/24/2022 12:17 PM EDT ----- Please let the patient know that his iron levels have improved.  His B12 does remain borderline low.  They need to make sure he consistently gets the B12 injections once monthly.  His platelets have dropped some from his prior baseline.  Does he still follow with hematology?  If he does not I would recommend he follow-up with them again.

## 2022-12-04 ENCOUNTER — Telehealth: Payer: Self-pay

## 2022-12-04 NOTE — Telephone Encounter (Signed)
-----   Message from Marikay Alar sent at 11/24/2022 12:17 PM EDT ----- Please let the patient know that his iron levels have improved.  His B12 does remain borderline low.  They need to make sure he consistently gets the B12 injections once monthly.  His platelets have dropped some from his prior baseline.  Does he still follow with hematology?  If he does not I would recommend he follow-up with them again.

## 2022-12-04 NOTE — Telephone Encounter (Signed)
Left message to call the office back regarding the message below.

## 2023-01-17 ENCOUNTER — Other Ambulatory Visit: Payer: Self-pay | Admitting: Family Medicine

## 2023-01-17 DIAGNOSIS — E611 Iron deficiency: Secondary | ICD-10-CM

## 2023-03-02 ENCOUNTER — Other Ambulatory Visit: Payer: Self-pay | Admitting: Family Medicine

## 2023-03-02 DIAGNOSIS — F419 Anxiety disorder, unspecified: Secondary | ICD-10-CM

## 2023-03-12 ENCOUNTER — Telehealth: Payer: Self-pay | Admitting: Family Medicine

## 2023-03-12 NOTE — Telephone Encounter (Signed)
Copied from CRM [id number]. Topic: Medicare AWV >> Mar 12, 2023 10:04 AM Payton Doughty wrote: Reason for CRM: Called 03/12/2023 to sched Annual Wellness Visit - NO VOICEMAIL  Verlee Rossetti; Care Guide Ambulatory Clinical Support [place] [facility] Direct Dial: [phone]

## 2023-04-05 ENCOUNTER — Other Ambulatory Visit: Payer: Self-pay | Admitting: Family Medicine

## 2023-04-05 DIAGNOSIS — F32A Depression, unspecified: Secondary | ICD-10-CM

## 2023-04-05 DIAGNOSIS — E611 Iron deficiency: Secondary | ICD-10-CM

## 2023-04-30 ENCOUNTER — Other Ambulatory Visit: Payer: Self-pay | Admitting: Family Medicine

## 2023-04-30 DIAGNOSIS — E538 Deficiency of other specified B group vitamins: Secondary | ICD-10-CM

## 2023-05-07 ENCOUNTER — Encounter: Payer: Self-pay | Admitting: Family Medicine

## 2023-05-29 ENCOUNTER — Ambulatory Visit: Payer: Medicare Other | Admitting: Family Medicine

## 2023-07-07 ENCOUNTER — Other Ambulatory Visit: Payer: Self-pay | Admitting: Family Medicine

## 2023-07-07 DIAGNOSIS — E538 Deficiency of other specified B group vitamins: Secondary | ICD-10-CM

## 2023-07-08 MED ORDER — "LUER LOCK SAFETY SYRINGES 25G X 1"" 3 ML MISC": 1.0000 mL | 0 refills | Status: AC

## 2023-07-08 MED ORDER — CYANOCOBALAMIN 1000 MCG/ML IJ SOLN: 1000.0000 ug | INTRAMUSCULAR | 0 refills | Status: DC

## 2023-07-09 ENCOUNTER — Other Ambulatory Visit: Payer: Self-pay | Admitting: Family Medicine

## 2023-07-09 DIAGNOSIS — M5416 Radiculopathy, lumbar region: Secondary | ICD-10-CM

## 2023-07-16 ENCOUNTER — Ambulatory Visit
Admission: RE | Admit: 2023-07-16 | Discharge: 2023-07-16 | Disposition: A | Discharge status: Home / Self Care | Payer: Medicare Other | Source: Ambulatory Visit | Attending: Family Medicine | Admitting: Family Medicine

## 2023-07-16 DIAGNOSIS — M5416 Radiculopathy, lumbar region: Secondary | ICD-10-CM | POA: Insufficient documentation

## 2023-07-25 ENCOUNTER — Other Ambulatory Visit: Payer: Self-pay

## 2023-07-25 ENCOUNTER — Emergency Department
Admission: EM | Admit: 2023-07-25 | Discharge: 2023-07-25 | Disposition: A | Discharge status: Home / Self Care | Attending: Emergency Medicine | Admitting: Emergency Medicine

## 2023-07-25 DIAGNOSIS — E860 Dehydration: Secondary | ICD-10-CM | POA: Diagnosis present

## 2023-07-25 DIAGNOSIS — W19XXXA Unspecified fall, initial encounter: Secondary | ICD-10-CM | POA: Diagnosis not present

## 2023-07-25 LAB — URINALYSIS, ROUTINE W REFLEX MICROSCOPIC
Bilirubin Urine: NEGATIVE
Glucose, UA: NEGATIVE mg/dL
Hgb urine dipstick: NEGATIVE
Ketones, ur: NEGATIVE mg/dL
Leukocytes,Ua: NEGATIVE
Nitrite: NEGATIVE
Protein, ur: NEGATIVE mg/dL
Specific Gravity, Urine: 1.017 (ref 1.005–1.030)
pH: 6 (ref 5.0–8.0)

## 2023-07-25 LAB — CBC WITH DIFFERENTIAL/PLATELET
Abs Immature Granulocytes: 0.03 10*3/uL (ref 0.00–0.07)
Basophils Absolute: 0 10*3/uL (ref 0.0–0.1)
Basophils Relative: 0 %
Eosinophils Absolute: 0 10*3/uL (ref 0.0–0.5)
Eosinophils Relative: 0 %
HCT: 33.4 % — ABNORMAL LOW (ref 39.0–52.0)
Hemoglobin: 11.1 g/dL — ABNORMAL LOW (ref 13.0–17.0)
Immature Granulocytes: 0 %
Lymphocytes Relative: 17 %
Lymphs Abs: 1.2 10*3/uL (ref 0.7–4.0)
MCH: 30.1 pg (ref 26.0–34.0)
MCHC: 33.2 g/dL (ref 30.0–36.0)
MCV: 90.5 fL (ref 80.0–100.0)
Monocytes Absolute: 0.6 10*3/uL (ref 0.1–1.0)
Monocytes Relative: 8 %
Neutro Abs: 5.4 10*3/uL (ref 1.7–7.7)
Neutrophils Relative %: 75 %
Platelets: 122 10*3/uL — ABNORMAL LOW (ref 150–400)
RBC: 3.69 MIL/uL — ABNORMAL LOW (ref 4.22–5.81)
RDW: 15.1 % (ref 11.5–15.5)
WBC: 7.3 10*3/uL (ref 4.0–10.5)
nRBC: 0 % (ref 0.0–0.2)

## 2023-07-25 LAB — BASIC METABOLIC PANEL
Anion gap: 14 (ref 5–15)
BUN: 31 mg/dL — ABNORMAL HIGH (ref 8–23)
CO2: 16 mmol/L — ABNORMAL LOW (ref 22–32)
Calcium: 8.9 mg/dL (ref 8.9–10.3)
Chloride: 101 mmol/L (ref 98–111)
Creatinine, Ser: 1.16 mg/dL (ref 0.61–1.24)
GFR, Estimated: 58 mL/min — ABNORMAL LOW (ref >60)
Glucose, Bld: 94 mg/dL (ref 70–99)
Potassium: 4.1 mmol/L (ref 3.5–5.1)
Sodium: 131 mmol/L — ABNORMAL LOW (ref 135–145)

## 2023-07-25 LAB — TSH: TSH: 5.291 u[IU]/mL — ABNORMAL HIGH (ref 0.350–4.500)

## 2023-07-25 LAB — T4, FREE: Free T4: 0.93 ng/dL (ref 0.61–1.12)

## 2023-07-25 MED ORDER — SODIUM CHLORIDE 0.9 % IV BOLUS
500.0000 mL | Freq: Once | INTRAVENOUS | Status: AC
  Administered 2023-07-25: 500 mL via INTRAVENOUS

## 2023-07-25 MED ORDER — SODIUM CHLORIDE 0.9 % IV BOLUS: 1000.0000 mL | Freq: Once | INTRAVENOUS | Status: DC

## 2023-07-25 NOTE — Discharge Instructions (Addendum)
 Follow-up with your primary care provider for further evaluation.  Your lab work and physical exam findings are very reassuring in the ED.

## 2023-07-25 NOTE — ED Provider Notes (Signed)
 [facility] Emergency Department Provider Note     Event Date/Time   First MD Initiated Contact with Patient 07/25/23 1731     (approximate)   History   Fall   HPI  Allen David is a 89 y.o. male presents to the ED for evaluation of a mechanical fall yesterday which he believes is contributed to being dehydrated. He denies hitting his head or LOC.  Patient is not on anticoagulation.  He denies dizziness, pain at this time, chest pain, or shortness of breath.    Physical Exam   Triage Vital Signs: ED Triage Vitals  Encounter Vitals Group     BP 07/25/23 1504 118/76     Systolic BP Percentile --      Diastolic BP Percentile --      Pulse Rate 07/25/23 1504 96     Resp 07/25/23 1504 18     Temp 07/25/23 1504 97.9 F (36.6 C)     Temp Source 07/25/23 1504 Oral     SpO2 07/25/23 1504 92 %     Weight 07/25/23 1504 165 lb (74.8 kg)     Height 07/25/23 1504 6' (1.829 m)     Head Circumference --      Peak Flow --      Pain Score 07/25/23 1511 0     Pain Loc --      Pain Education --      Exclude from Growth Chart --     Most recent vital signs: Vitals:   07/25/23 1935 07/25/23 2030  BP: 109/81 (!) 156/128  Pulse: 80 80  Resp: 18 18  Temp: 98.4 F (36.9 C)   SpO2: 99% 99%    General: Well appearing. Alert and oriented. INAD.  Skin:  Warm, dry and intact. No rashes or lesions noted. Poor turgor to bilateral hands.      Head:  NCAT.  Throat: Cracked lips CV:  Good peripheral perfusion. RRR.  RESP:  Normal effort. LCTAB.  ABD:  No distention. Soft, Non tender.  MSK:   Full ROM in all joints. No swelling, deformity or tenderness.  NEURO: Cranial nerves II-XII intact. No focal deficits. Sensation and motor function intact. 5/5 muscle strength of UE & LE. Gait is steady.  OTHER:  Good capillary refill.      ED Results / Procedures / Treatments   Labs (all labs ordered are listed, but only abnormal results are displayed) Labs  Reviewed  CBC WITH DIFFERENTIAL/PLATELET - Abnormal; Notable for the following components:      Result Value   RBC 3.69 (*)    Hemoglobin 11.1 (*)    HCT 33.4 (*)    Platelets 122 (*)    All other components within normal limits  BASIC METABOLIC PANEL - Abnormal; Notable for the following components:   Sodium 131 (*)    CO2 16 (*)    BUN 31 (*)    GFR, Estimated 58 (*)    All other components within normal limits  URINALYSIS, ROUTINE W REFLEX MICROSCOPIC - Abnormal; Notable for the following components:   Color, Urine YELLOW (*)    APPearance HAZY (*)    All other components within normal limits  TSH - Abnormal; Notable for the following components:   TSH 5.291 (*)    All other components within normal limits  T4, FREE  T3, FREE    No results found.  PROCEDURES:  Critical Care performed: No  Procedures   MEDICATIONS  ORDERED IN ED: Medications  sodium chloride 0.9 % bolus 500 mL (0 mLs Intravenous Stopped 07/25/23 1931)     IMPRESSION / MDM / ASSESSMENT AND PLAN / ED COURSE  I reviewed the triage vital signs and the nursing notes.                                 89 y.o. male presents to the emergency department for evaluation and treatment of fall and evaluation for dehydration. See HPI for further details.   Differential diagnosis includes, but is not limited to electrolyte abnormality, UTI, dehydration, anemia  Patient's presentation is most consistent with acute complicated illness / injury requiring diagnostic workup.  Patient requesting thyroid panel.  Patient states that his primary care is at the [place] and they would like to test his thyroid function.  I discussed with the patient this is not appropriate for a ED visit however given physical exam and poor turgor and about hyponatremia, I decided to obtain the labs while waiting for IV fluids.  Patient does not complain of any pain.  Lab work is reassuring.  Hemoglobin 11.1 but this is consistent with his trend.   Obtained urinalysis which is reassuring.  Thyroid panel pending.  I discussed with [facility]  Patient verbalized understanding.  Patient is in stable condition for discharge home.  ED return precaution discussed.  FINAL CLINICAL IMPRESSION(S) / ED DIAGNOSES   Final diagnoses:  Fall, initial encounter  Dehydration   Rx / DC Orders   ED Discharge Orders     None      Note:  This document was prepared using Dragon voice recognition software and may include unintentional dictation errors.    Romeo Apple, Luise Yamamoto A, PA-C 07/25/23 1191    Phineas Semen, MD 07/26/23 1537

## 2023-07-25 NOTE — ED Triage Notes (Signed)
 Pt to ed from home via POV for a mechanical fall. Pt lost his balance and fell.  Pt landed on his tail bone. Pt denies hitting his head on anything and denies any LOC. Pt unknown if takes blood thinners. Pt is caox4, in no acute distress in triage. Pt has not other complaints at this time. Pt states "I think my anxiety is bothering me".

## 2023-07-27 ENCOUNTER — Other Ambulatory Visit: Payer: Self-pay | Admitting: Family Medicine

## 2023-07-27 DIAGNOSIS — F32A Depression, unspecified: Secondary | ICD-10-CM

## 2023-07-27 LAB — T3, FREE: T3, Free: 2.3 pg/mL (ref 2.0–4.4)

## 2023-07-27 NOTE — Telephone Encounter (Signed)
 Copied from CRM [id number]. Topic: Clinical - Medication Refill >> Jul 27, 2023  9:48 AM Desma Mcgregor wrote: Most Recent Primary Care Visit:  Provider: Birdie Sons ERIC G  Department: [facility]-  Visit Type: OFFICE VISIT  Date: 11/21/2022  Medication: busPIRone (BUSPAR) 7.5 MG tablet  Has the patient contacted their pharmacy? Yes, Says they faxed a rx request last week, but do not see one was sent.  Is this the correct pharmacy for this prescription? Yes If no, delete pharmacy and type the correct one.  This is the patient's preferred pharmacy:  [facility] [address] [place] - [address] [place] [address] Phone: [phone] Fax: [phone]  Has the prescription been filled recently? Yes  Is the patient out of the medication? Yes  Has the patient been seen for an appointment in the last year OR does the patient have an upcoming appointment? Yes  Can we respond through MyChart? No  Agent: Please be advised that Rx refills may take up to 3 business days. We ask that you follow-up with your pharmacy.

## 2023-07-29 ENCOUNTER — Other Ambulatory Visit: Payer: Self-pay

## 2023-07-29 DIAGNOSIS — I10 Essential (primary) hypertension: Secondary | ICD-10-CM

## 2023-07-29 DIAGNOSIS — F32A Depression, unspecified: Secondary | ICD-10-CM

## 2023-07-29 MED ORDER — BUSPIRONE HCL 7.5 MG PO TABS: 7.5000 mg | ORAL_TABLET | Freq: Two times a day (BID) | ORAL | 0 refills | Status: DC

## 2023-07-29 MED ORDER — BUSPIRONE HCL 7.5 MG PO TABS
7.5000 mg | ORAL_TABLET | Freq: Two times a day (BID) | ORAL | Status: DC
Start: 2023-07-29 — End: 2023-07-29

## 2023-07-29 NOTE — Telephone Encounter (Signed)
 Noted and prescription was sent in today for the patient.

## 2023-08-03 ENCOUNTER — Telehealth: Payer: Self-pay

## 2023-08-03 NOTE — Telephone Encounter (Signed)
 I left a voicemail for patient on his home phone letting him know that Dr. Marikay Alar has left our practice and asked if he would like to schedule a transfer of care appointment with one of our providers who are accepting new patients.  When patient calls back, please schedule him for a TOC appointment.

## 2023-08-04 ENCOUNTER — Ambulatory Visit (INDEPENDENT_AMBULATORY_CARE_PROVIDER_SITE_OTHER): Admitting: Nurse Practitioner

## 2023-08-04 ENCOUNTER — Encounter: Payer: Self-pay | Admitting: Nurse Practitioner

## 2023-08-04 VITALS — BP 118/74 | HR 97 | Temp 97.3°F | Ht 72.0 in | Wt 158.9 lb

## 2023-08-04 DIAGNOSIS — F419 Anxiety disorder, unspecified: Secondary | ICD-10-CM | POA: Diagnosis not present

## 2023-08-04 MED ORDER — BUSPIRONE HCL 10 MG PO TABS: 10.0000 mg | ORAL_TABLET | Freq: Two times a day (BID) | ORAL | 2 refills | Status: DC

## 2023-08-04 NOTE — Patient Instructions (Signed)
 Will increase Buspar from 7.5 mg to 10 mg  Let us know if it is not helping

## 2023-08-04 NOTE — Progress Notes (Signed)
 Established Patient Office Visit  Subjective:  Patient ID: Allen David, male    DOB: 07-Oct-1927  Age: 89 y.o. MRN: [id number]  CC:  Chief Complaint  Patient presents with   Medical Management of Chronic Issues   Discussed the use of a AI scribe software for clinical note transcription with the patient, who gave verbal consent to proceed.  HPI  Allen LOMAS "ROBBIE" is a 89 year old male who presents with anxiety and back pain. He is accompanied by daughter Lanora Manis, his power of attorney.  He experiences significant anxiety, which he attributes to his chronic back pain. Anxiety attacks occur up to two or three times a day, characterized by sweating, feeling unbalanced, and a sensation of 'nerves and sweating through the heart.' These episodes are influenced by his surroundings and activities. Current medications include duloxetine 60 mg in the morning and Buspar twice a day. Buspar provides some relief, but duloxetine is ineffective. He has previously tried Xanax and clonazepam, which were discontinued by the [place], as well as Prozac and other medications.  He has difficulty sleeping, often not falling asleep until 4 AM and waking around 9:30 or 10 AM. His back pain and anxiety contribute to sleep disturbances. During the day, he stays active by managing finances on the computer, feeding his dog, and occasionally using a wheelchair to move around. He is frustrated with his inability to perform activities as he used to.  Past Medical History:  Diagnosis Date   Anxiety    Back injury    with chronic pain   CAD in native artery    Chronic low back pain    Depression    ED (erectile dysfunction)    Elevated PSA    HLD (hyperlipidemia)    HTN (hypertension)    IBS (irritable bowel syndrome)    Malaise and fatigue    Pernicious anemia    Vitamin B deficiency     Past Surgical History:  Procedure Laterality Date   CARPAL TUNNEL RELEASE     EYE SURGERY     VASECTOMY       Family History  Problem Relation Age of Onset   Stroke Mother    Hypertension Mother    Ulcers Mother    Cancer Father        multiple myeloma   Diabetes Sister    Diabetes Brother    Diabetes Brother     Social History   Socioeconomic History   Marital status: Divorced    Spouse name: Not on file   Number of children: Not on file   Years of education: Not on file   Highest education level: Not on file  Occupational History   Occupation: Retired  Tobacco Use   Smoking status: Former    Current packs/day: 0.00    Types: Cigarettes    Quit date: 05/19/1966    Years since quitting: 57.2   Smokeless tobacco: Former   Tobacco comments:    Quit in 1968  Vaping Use   Vaping status: Never Used  Substance and Sexual Activity   Alcohol use: Yes    Alcohol/week: 0.0 - 1.0 standard drinks of alcohol    Comment: daily   Drug use: No   Sexual activity: Not Currently  Other Topics Concern   Not on file  Social History Narrative   Lives in [place] alone. Children, 4 girls.      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      Regularly exercise- rides bike and mows lawn. Divorced.   Social Drivers of Corporate investment banker Strain: Low Risk  (02/21/2020)   Overall Financial Resource Strain (CARDIA)    Difficulty of Paying Living Expenses: Not hard at all  Food Insecurity: No Food Insecurity (02/21/2020)   Hunger Vital Sign    Worried About Running Out of Food in the Last Year: Never true    Ran Out of Food in the Last Year: Never true  Transportation Needs: No Transportation Needs (02/21/2020)   PRAPARE - Administrator, Civil Service (Medical): No    Lack of Transportation (Non-Medical): No  Physical Activity: Not on file  Stress: No Stress Concern Present (02/21/2020)   [employer] of Occupational Health - Occupational Stress Questionnaire    Feeling of Stress : Only a little  Social Connections: Not  on file  Intimate Partner Violence: Not on file     Outpatient Medications Prior to Visit  Medication Sig Dispense Refill   amLODipine (NORVASC) 5 MG tablet Take 1 tablet (5 mg total) by mouth daily. 90 tablet 0   aspirin 81 MG chewable tablet Chew 1 tablet by mouth 1 day or 1 dose.     atorvastatin (LIPITOR) 20 MG tablet Take 1 tablet (20 mg total) by mouth daily. 90 tablet 3   bismuth subsalicylate (PEPTO BISMOL) 262 MG/15ML suspension Take 30 mLs by mouth every 6 (six) hours as needed.     cyanocobalamin (VITAMIN B12) 1000 MCG/ML injection Inject 1 mL (1,000 mcg total) into the muscle every 30 (thirty) days. 1 mL 0   hydroxychloroquine (PLAQUENIL) 200 MG tablet Take 1 tablet (200 mg total) by mouth 2 (two) times daily. 60 tablet 1   Olopatadine HCl 0.2 % SOLN Apply 1 drop to eye daily. 2.5 mL 0   pantoprazole (PROTONIX) 40 MG tablet Take 1 tablet by mouth once daily 30 tablet 0   pantoprazole (PROTONIX) 40 MG tablet Take 1 tablet by mouth once daily 30 tablet 0   SYRINGE-NEEDLE, DISP, 3 ML (LUER LOCK SAFETY SYRINGES) 25G X 1" 3 ML MISC Inject 1 mL into the muscle every 30 (thirty) days. 12 each 0   busPIRone (BUSPAR) 7.5 MG tablet Take 1 tablet (7.5 mg total) by mouth 2 (two) times daily. 60 tablet 0   SV IRON 325 (65 Fe) MG tablet Take 1 tablet by mouth once daily with breakfast 60 tablet 0   Facility-Administered Medications Prior to Visit  Medication Dose Route Frequency Provider Last Rate Last Admin   cyanocobalamin ((VITAMIN B-12)) injection 1,000 mcg  1,000 mcg Intramuscular Once Glori Luis, MD        Allergies  Allergen Reactions   Lexapro [Escitalopram Oxalate]     hypertension   Tetanus Toxoids Swelling    ROS Review of Systems  Neurological:  Negative for dizziness.   Negative unless indicated in HPI.    Objective:    Physical Exam Constitutional:      Appearance: Normal appearance.  Cardiovascular:     Rate and Rhythm: Normal rate and regular rhythm.      Pulses: Normal pulses.     Heart sounds: Normal heart sounds.  Pulmonary:     Effort: Pulmonary effort is normal.     Breath sounds: Normal breath sounds.  Musculoskeletal:     Cervical back: Normal range of motion.  Neurological:     General: No focal deficit  present.     Mental Status: He is alert. Mental status is at baseline.  Psychiatric:        Mood and Affect: Mood normal.        Behavior: Behavior normal.        Thought Content: Thought content normal.        Judgment: Judgment normal.     BP 118/74   Pulse 97   Temp (!) 97.3 F (36.3 C)   Ht 6' (1.829 m)   Wt 158 lb 14.4 oz (72.1 kg)   SpO2 98%   BMI 21.55 kg/m  Wt Readings from Last 3 Encounters:  08/04/23 158 lb 14.4 oz (72.1 kg)  07/25/23 165 lb (74.8 kg)  11/21/22 171 lb (77.6 kg)     Health Maintenance  Topic Date Due   DTaP/Tdap/Td (1 - Tdap) Never done   Medicare Annual Wellness (AWV)  02/20/2021   COVID-19 Vaccine (3 - Moderna risk series) 08/19/2023 (Originally 08/25/2019)   Pneumonia Vaccine 37+ Years old  Completed   INFLUENZA VACCINE  Completed   Zoster Vaccines- Shingrix  Completed   HPV VACCINES  Aged Out    There are no preventive care reminders to display for this patient.  Lab Results  Component Value Date   TSH 5.291 (H) 07/25/2023   Lab Results  Component Value Date   WBC 7.3 07/25/2023   HGB 11.1 (L) 07/25/2023   HCT 33.4 (L) 07/25/2023   MCV 90.5 07/25/2023   PLT 122 (L) 07/25/2023   Lab Results  Component Value Date   NA 131 (L) 07/25/2023   K 4.1 07/25/2023   CO2 16 (L) 07/25/2023   GLUCOSE 94 07/25/2023   BUN 31 (H) 07/25/2023   CREATININE 1.16 07/25/2023   BILITOT 0.6 11/21/2022   ALKPHOS 61 06/16/2021   AST 20 11/21/2022   ALT 13 11/21/2022   PROT 6.4 11/21/2022   ALBUMIN 3.5 06/16/2021   CALCIUM 8.9 07/25/2023   ANIONGAP 14 07/25/2023   GFR 73.52 11/16/2019   Lab Results  Component Value Date   CHOL 115 11/21/2022   Lab Results  Component Value  Date   HDL 48 11/21/2022   Lab Results  Component Value Date   LDLCALC 52 11/21/2022   Lab Results  Component Value Date   TRIG 69 11/21/2022   Lab Results  Component Value Date   CHOLHDL 2.4 11/21/2022   No results found for: "HGBA1C"    Assessment & Plan:  Anxiety Assessment & Plan: Experiences anxiety attacks up to three times daily. Buspirone alleviates symptoms; duloxetine appears ineffective. Discussed increasing buspirone to enhance effect. Advised maintaining current regimen to assess increased buspirone dose effect. - Increase buspirone dose to 10 mg twice daily. - Continue duloxetine 60 mg daily.   Other orders -     busPIRone HCl; Take 1 tablet (10 mg total) by mouth 2 (two) times daily.  Dispense: 60 tablet; Refill: 2    Follow-up: No follow-ups on file.   Kara Dies, NP

## 2023-08-11 ENCOUNTER — Telehealth: Payer: Self-pay | Admitting: Family Medicine

## 2023-08-11 NOTE — Telephone Encounter (Signed)
 Dr Birdie Sons has lef the practice. If you wish to continue care at this office, please call and schedule a transfer of care to one of the following providers: Dr Charlann Lange, MD,  Darleen Crocker or Kara Dies, NP.   E2C2 please schedule TOC.

## 2023-08-16 NOTE — Assessment & Plan Note (Signed)
 Experiences anxiety attacks up to three times daily. Buspirone alleviates symptoms; duloxetine appears ineffective. Discussed increasing buspirone to enhance effect. Advised maintaining current regimen to assess increased buspirone dose effect. - Increase buspirone dose to 10 mg twice daily. - Continue duloxetine 60 mg daily.

## 2023-09-02 ENCOUNTER — Other Ambulatory Visit: Payer: Self-pay

## 2023-09-02 DIAGNOSIS — E538 Deficiency of other specified B group vitamins: Secondary | ICD-10-CM

## 2023-09-02 MED ORDER — CYANOCOBALAMIN 1000 MCG/ML IJ SOLN: 1000.0000 ug | INTRAMUSCULAR | 0 refills | Status: AC

## 2023-10-09 ENCOUNTER — Encounter: Payer: Self-pay | Admitting: Nurse Practitioner

## 2023-10-13 NOTE — Telephone Encounter (Signed)
 Left message for Patient to call back so I can find out if he has already increased the dose of Buspar  and if so is he having any symptoms.

## 2023-10-14 NOTE — Telephone Encounter (Signed)
 Patient states he has already taking Buspar  10 mg tablets 3 times a day. Patient would like for you to send in a prescription for 15 mg take 2 tablets daily. Patient states he is doing ok on the 30 mg dose of the Buspar . Patient wants me to call him back and let him know if you call this in or not.

## 2023-10-16 ENCOUNTER — Other Ambulatory Visit: Payer: Self-pay | Admitting: Nurse Practitioner

## 2023-10-16 MED ORDER — BUSPIRONE HCL 15 MG PO TABS: 15.0000 mg | ORAL_TABLET | Freq: Two times a day (BID) | ORAL | 2 refills | Status: DC

## 2023-10-16 NOTE — Progress Notes (Signed)
 Patient has self increased Buspar  from 20 mg daily to 30 mg daily and tolerating well. New prescription of Buspar  15 mg twice a day sent to pharmacy.

## 2023-11-11 ENCOUNTER — Emergency Department

## 2023-11-11 ENCOUNTER — Encounter: Payer: Self-pay | Admitting: *Deleted

## 2023-11-11 ENCOUNTER — Other Ambulatory Visit: Payer: Self-pay

## 2023-11-11 ENCOUNTER — Inpatient Hospital Stay
Admission: EM | Admit: 2023-11-11 | Discharge: 2023-11-19 | DRG: 092 | Disposition: A | Discharge status: Skilled Nursing Facility | Attending: Student in an Organized Health Care Education/Training Program | Admitting: Student in an Organized Health Care Education/Training Program

## 2023-11-11 DIAGNOSIS — Z66 Do not resuscitate: Secondary | ICD-10-CM | POA: Diagnosis present

## 2023-11-11 DIAGNOSIS — R109 Unspecified abdominal pain: Secondary | ICD-10-CM

## 2023-11-11 DIAGNOSIS — D509 Iron deficiency anemia, unspecified: Secondary | ICD-10-CM | POA: Diagnosis present

## 2023-11-11 DIAGNOSIS — Z79899 Other long term (current) drug therapy: Secondary | ICD-10-CM

## 2023-11-11 DIAGNOSIS — E785 Hyperlipidemia, unspecified: Secondary | ICD-10-CM | POA: Diagnosis present

## 2023-11-11 DIAGNOSIS — G9341 Metabolic encephalopathy: Secondary | ICD-10-CM

## 2023-11-11 DIAGNOSIS — F1290 Cannabis use, unspecified, uncomplicated: Secondary | ICD-10-CM | POA: Diagnosis present

## 2023-11-11 DIAGNOSIS — G9340 Encephalopathy, unspecified: Secondary | ICD-10-CM | POA: Diagnosis present

## 2023-11-11 DIAGNOSIS — R112 Nausea with vomiting, unspecified: Secondary | ICD-10-CM | POA: Diagnosis present

## 2023-11-11 DIAGNOSIS — R4182 Altered mental status, unspecified: Principal | ICD-10-CM

## 2023-11-11 DIAGNOSIS — I480 Paroxysmal atrial fibrillation: Secondary | ICD-10-CM | POA: Diagnosis not present

## 2023-11-11 DIAGNOSIS — K219 Gastro-esophageal reflux disease without esophagitis: Secondary | ICD-10-CM | POA: Diagnosis present

## 2023-11-11 DIAGNOSIS — I2510 Atherosclerotic heart disease of native coronary artery without angina pectoris: Secondary | ICD-10-CM | POA: Diagnosis present

## 2023-11-11 DIAGNOSIS — F411 Generalized anxiety disorder: Secondary | ICD-10-CM | POA: Diagnosis present

## 2023-11-11 DIAGNOSIS — T43595A Adverse effect of other antipsychotics and neuroleptics, initial encounter: Secondary | ICD-10-CM | POA: Diagnosis present

## 2023-11-11 DIAGNOSIS — Z9181 History of falling: Secondary | ICD-10-CM

## 2023-11-11 DIAGNOSIS — Z8249 Family history of ischemic heart disease and other diseases of the circulatory system: Secondary | ICD-10-CM

## 2023-11-11 DIAGNOSIS — F410 Panic disorder [episodic paroxysmal anxiety] without agoraphobia: Secondary | ICD-10-CM

## 2023-11-11 DIAGNOSIS — N179 Acute kidney failure, unspecified: Secondary | ICD-10-CM | POA: Diagnosis not present

## 2023-11-11 DIAGNOSIS — E8721 Acute metabolic acidosis: Secondary | ICD-10-CM | POA: Diagnosis present

## 2023-11-11 DIAGNOSIS — M545 Low back pain, unspecified: Secondary | ICD-10-CM | POA: Diagnosis present

## 2023-11-11 DIAGNOSIS — R17 Unspecified jaundice: Secondary | ICD-10-CM | POA: Diagnosis present

## 2023-11-11 DIAGNOSIS — G928 Other toxic encephalopathy: Secondary | ICD-10-CM | POA: Diagnosis not present

## 2023-11-11 DIAGNOSIS — E236 Other disorders of pituitary gland: Secondary | ICD-10-CM | POA: Diagnosis present

## 2023-11-11 DIAGNOSIS — F4320 Adjustment disorder, unspecified: Secondary | ICD-10-CM | POA: Diagnosis present

## 2023-11-11 DIAGNOSIS — R21 Rash and other nonspecific skin eruption: Secondary | ICD-10-CM | POA: Diagnosis present

## 2023-11-11 DIAGNOSIS — Z888 Allergy status to other drugs, medicaments and biological substances status: Secondary | ICD-10-CM

## 2023-11-11 DIAGNOSIS — I10 Essential (primary) hypertension: Secondary | ICD-10-CM | POA: Diagnosis present

## 2023-11-11 DIAGNOSIS — F32A Depression, unspecified: Secondary | ICD-10-CM | POA: Diagnosis present

## 2023-11-11 DIAGNOSIS — Z681 Body mass index (BMI) 19 or less, adult: Secondary | ICD-10-CM

## 2023-11-11 DIAGNOSIS — D72829 Elevated white blood cell count, unspecified: Secondary | ICD-10-CM | POA: Diagnosis present

## 2023-11-11 DIAGNOSIS — F0150 Vascular dementia without behavioral disturbance: Secondary | ICD-10-CM | POA: Diagnosis present

## 2023-11-11 DIAGNOSIS — Z807 Family history of other malignant neoplasms of lymphoid, hematopoietic and related tissues: Secondary | ICD-10-CM

## 2023-11-11 DIAGNOSIS — Z833 Family history of diabetes mellitus: Secondary | ICD-10-CM

## 2023-11-11 DIAGNOSIS — R54 Age-related physical debility: Secondary | ICD-10-CM | POA: Diagnosis present

## 2023-11-11 DIAGNOSIS — Z87891 Personal history of nicotine dependence: Secondary | ICD-10-CM

## 2023-11-11 DIAGNOSIS — Z823 Family history of stroke: Secondary | ICD-10-CM

## 2023-11-11 DIAGNOSIS — E871 Hypo-osmolality and hyponatremia: Secondary | ICD-10-CM | POA: Diagnosis present

## 2023-11-11 DIAGNOSIS — K589 Irritable bowel syndrome without diarrhea: Secondary | ICD-10-CM | POA: Diagnosis present

## 2023-11-11 DIAGNOSIS — I4891 Unspecified atrial fibrillation: Secondary | ICD-10-CM

## 2023-11-11 DIAGNOSIS — E860 Dehydration: Secondary | ICD-10-CM | POA: Diagnosis present

## 2023-11-11 DIAGNOSIS — Z887 Allergy status to serum and vaccine status: Secondary | ICD-10-CM

## 2023-11-11 DIAGNOSIS — I440 Atrioventricular block, first degree: Secondary | ICD-10-CM | POA: Diagnosis present

## 2023-11-11 DIAGNOSIS — L409 Psoriasis, unspecified: Secondary | ICD-10-CM | POA: Diagnosis present

## 2023-11-11 DIAGNOSIS — Z7982 Long term (current) use of aspirin: Secondary | ICD-10-CM

## 2023-11-11 DIAGNOSIS — G8929 Other chronic pain: Secondary | ICD-10-CM | POA: Diagnosis present

## 2023-11-11 LAB — BASIC METABOLIC PANEL WITH GFR
Anion gap: 13 (ref 5–15)
BUN: 23 mg/dL (ref 8–23)
CO2: 18 mmol/L — ABNORMAL LOW (ref 22–32)
Calcium: 8.4 mg/dL — ABNORMAL LOW (ref 8.9–10.3)
Chloride: 102 mmol/L (ref 98–111)
Creatinine, Ser: 0.99 mg/dL (ref 0.61–1.24)
GFR, Estimated: >60 mL/min (ref >60)
Glucose, Bld: 123 mg/dL — ABNORMAL HIGH (ref 70–99)
Potassium: 3.9 mmol/L (ref 3.5–5.1)
Sodium: 133 mmol/L — ABNORMAL LOW (ref 135–145)

## 2023-11-11 LAB — CBC
HCT: 30.2 % — ABNORMAL LOW (ref 39.0–52.0)
Hemoglobin: 10 g/dL — ABNORMAL LOW (ref 13.0–17.0)
MCH: 29.8 pg (ref 26.0–34.0)
MCHC: 33.1 g/dL (ref 30.0–36.0)
MCV: 89.9 fL (ref 80.0–100.0)
Platelets: 101 10*3/uL — ABNORMAL LOW (ref 150–400)
RBC: 3.36 MIL/uL — ABNORMAL LOW (ref 4.22–5.81)
RDW: 15.9 % — ABNORMAL HIGH (ref 11.5–15.5)
WBC: 6.5 10*3/uL (ref 4.0–10.5)
nRBC: 0 % (ref 0.0–0.2)

## 2023-11-11 LAB — TROPONIN I (HIGH SENSITIVITY): Troponin I (High Sensitivity): 17 ng/L (ref <18)

## 2023-11-11 NOTE — ED Triage Notes (Signed)
 Pt to triage via wheelchair.   Pt reports abd pain with nausea.  Pt has dry heaves in triage.  Pt denies chest pain.  No sob.  Pt alert.

## 2023-11-11 NOTE — ED Provider Notes (Signed)
 [facility] Provider Note    Event Date/Time   First MD Initiated Contact with Patient 11/11/23 2336     (approximate)   History   Nausea and Abdominal Pain   HPI  Allen David is a 89 y.o. male with a history of CAD, hypertension, hyperlipidemia, IBS, and anxiety who presents with altered mental status, abdominal pain, vomiting and weakness.  The family members report that the patient has been gradually becoming out of it over the last 3 days.  He is normally fully verbal and is oriented.  However today he has just been mumbling and weak.  He has complained of some abdominal pain and cannot get comfortable.  He had an episode of dry heaving while in triage.  The patient himself is unable to give any history right now.  I reviewed the past medical records per the patient's most recent outpatient encounter in our system was on 3/18 with primary care for evaluation of anxiety and back pain.   Physical Exam   Triage Vital Signs: ED Triage Vitals [11/11/23 2239]  Encounter Vitals Group     BP 130/85     Girls Systolic BP Percentile      Girls Diastolic BP Percentile      Boys Systolic BP Percentile      Boys Diastolic BP Percentile      Pulse Rate (!) 124     Resp 20     Temp 98.4 F (36.9 C)     Temp Source Oral     SpO2 100 %     Weight 158 lb 11.7 oz (72 kg)     Height 6' (1.829 m)     Head Circumference      Peak Flow      Pain Score 5     Pain Loc      Pain Education      Exclude from Growth Chart     Most recent vital signs: Vitals:   11/12/23 0130 11/12/23 0200  BP: (!) 130/40 107/85  Pulse: 76 83  Resp: 18   Temp:    SpO2: 99% 95%     General: Awake, disoriented, no distress.  CV:  Good peripheral perfusion.  Resp:  Normal effort.  Lungs CTAB. Abd:  Soft with no focal tenderness.  No distention.  Other:  EOMI.  PERRLA.  No facial droop.  Motor intact in all extremities.  Mumbling speech, unable to answer questions or  follow commands.  Diffuse psoriatic type rash over extremities which family reports is chronic.   ED Results / Procedures / Treatments   Labs (all labs ordered are listed, but only abnormal results are displayed) Labs Reviewed  URINALYSIS, ROUTINE W REFLEX MICROSCOPIC - Abnormal; Notable for the following components:      Result Value   Color, Urine AMBER (*)    APPearance CLEAR (*)    Hgb urine dipstick SMALL (*)    Protein, ur 30 (*)    All other components within normal limits  BASIC METABOLIC PANEL WITH GFR - Abnormal; Notable for the following components:   Sodium 133 (*)    CO2 18 (*)    Glucose, Bld 123 (*)    Calcium  8.4 (*)    All other components within normal limits  CBC - Abnormal; Notable for the following components:   RBC 3.36 (*)    Hemoglobin 10.0 (*)    HCT 30.2 (*)    RDW 15.9 (*)  Platelets 101 (*)    All other components within normal limits  HEPATIC FUNCTION PANEL - Abnormal; Notable for the following components:   Total Protein 6.3 (*)    Albumin 3.3 (*)    Total Bilirubin 1.3 (*)    Bilirubin, Direct 0.3 (*)    Indirect Bilirubin 1.0 (*)    All other components within normal limits  LACTIC ACID, PLASMA - Abnormal; Notable for the following components:   Lactic Acid, Venous 2.5 (*)    All other components within normal limits  LACTIC ACID, PLASMA - Abnormal; Notable for the following components:   Lactic Acid, Venous 2.0 (*)    All other components within normal limits  LIPASE, BLOOD  TROPONIN I (HIGH SENSITIVITY)     EKG  ED ECG REPORT I, Waylon Cassis, the attending physician, personally viewed and interpreted this ECG.  Date: 11/11/2023 EKG Time: 2241 Rate: 100 Rhythm: Atrial fibrillation QRS Axis: Left axis Intervals: normal ST/T Wave abnormalities: normal Narrative Interpretation: no evidence of acute ischemia    RADIOLOGY  Chest x-ray: I independently viewed interpreted the images; there is no focal consolidation or  edema  CT head:  IMPRESSION:  1. No acute intracranial abnormality.  2. Empty sella. Findings is often a normal anatomic variant but can  be associated with idiopathic intracranial hypertension (pseudotumor  cerebri).   CT abdomen/pelvis:  IMPRESSION:  Diverticulosis without diverticulitis.    Stable left adrenal nodule consistent with adenoma.    No acute abnormality to correspond with the given clinical history  is noted.   PROCEDURES:  Critical Care performed: No  Procedures   MEDICATIONS ORDERED IN ED: Medications  LORazepam (ATIVAN) injection 1 mg (1 mg Intravenous Given 11/12/23 0037)  ondansetron  (ZOFRAN ) injection 4 mg (4 mg Intravenous Given 11/12/23 0037)  iohexol (OMNIPAQUE) 300 MG/ML solution 100 mL (100 mLs Intravenous Contrast Given 11/12/23 0111)  lactated ringers  bolus 1,000 mL (1,000 mLs Intravenous New Bag/Given 11/12/23 0133)     IMPRESSION / MDM / ASSESSMENT AND PLAN / ED COURSE  I reviewed the triage vital signs and the nursing notes.  89 year old male with PMH as noted above presents with altered mental status, weakness, abdominal pain and nausea over the last several days.  The patient is altered and unable to give any history.  On exam, his heart rate is around 100.  Other vital signs are normal.  Neurologic exam is nonfocal.  EKG shows possible atrial fibrillation which would be new for the patient.  BMP and CBC show no acute findings.  Initial troponin is negative.  Differential diagnosis is very broad and includes, but is not limited to, gastroenteritis, colitis, diverticulitis, pancreatitis, viral syndrome, UTI, other acute infection/sepsis, acute CVA, ICH, or other CNS cause, less likely cardiac etiology.  We will obtain CT head as well as a CT of the abdomen/pelvis, lab workup, and reassess.  Patient's presentation is most consistent with acute presentation with potential threat to life or bodily function.  The patient is on the cardiac monitor  to evaluate for evidence of arrhythmia and/or significant heart rate changes.  ----------------------------------------- 3:10 AM on 11/12/2023 -----------------------------------------  Initial lactic acid was elevated.  Repeat is improved after fluids.  Other lab workup is reassuring.  Troponin is negative.  BMP and hepatic function panel are unremarkable.  CBC shows no leukocytosis.  CTs of the head and abdomen are both negative for acute findings that would explain the patient's symptoms.  Overall presentation is most consistent with acute encephalopathy,  possibly due to gastroenteritis or other viral infection.  He does look a bit better after fluids and is more alert and talkative although still not at baseline.  The patient will need admission for further management.  I consulted Dr. Cleatus from the hospitalist service; based on our discussion she agrees to evaluate the patient for admission.   Rx / DC Orders   ED Discharge Orders     None        Note:  This document was prepared using Dragon voice recognition software and may include unintentional dictation errors.    Jacolyn Pae, MD 11/12/23 [phone]

## 2023-11-12 ENCOUNTER — Other Ambulatory Visit: Payer: Self-pay

## 2023-11-12 ENCOUNTER — Emergency Department

## 2023-11-12 ENCOUNTER — Observation Stay
Admit: 2023-11-12 | Discharge: 2023-11-12 | Disposition: A | Discharge status: Home / Self Care | Attending: Internal Medicine | Admitting: Internal Medicine

## 2023-11-12 DIAGNOSIS — E8721 Acute metabolic acidosis: Secondary | ICD-10-CM | POA: Diagnosis present

## 2023-11-12 DIAGNOSIS — D508 Other iron deficiency anemias: Secondary | ICD-10-CM | POA: Diagnosis not present

## 2023-11-12 DIAGNOSIS — E236 Other disorders of pituitary gland: Secondary | ICD-10-CM | POA: Diagnosis present

## 2023-11-12 DIAGNOSIS — F1290 Cannabis use, unspecified, uncomplicated: Secondary | ICD-10-CM | POA: Diagnosis present

## 2023-11-12 DIAGNOSIS — L409 Psoriasis, unspecified: Secondary | ICD-10-CM | POA: Diagnosis present

## 2023-11-12 DIAGNOSIS — F0150 Vascular dementia without behavioral disturbance: Secondary | ICD-10-CM | POA: Diagnosis present

## 2023-11-12 DIAGNOSIS — G8929 Other chronic pain: Secondary | ICD-10-CM | POA: Insufficient documentation

## 2023-11-12 DIAGNOSIS — F411 Generalized anxiety disorder: Secondary | ICD-10-CM | POA: Diagnosis present

## 2023-11-12 DIAGNOSIS — Z66 Do not resuscitate: Secondary | ICD-10-CM | POA: Diagnosis present

## 2023-11-12 DIAGNOSIS — E871 Hypo-osmolality and hyponatremia: Secondary | ICD-10-CM | POA: Diagnosis present

## 2023-11-12 DIAGNOSIS — F32A Depression, unspecified: Secondary | ICD-10-CM | POA: Diagnosis present

## 2023-11-12 DIAGNOSIS — N179 Acute kidney failure, unspecified: Secondary | ICD-10-CM | POA: Diagnosis not present

## 2023-11-12 DIAGNOSIS — I480 Paroxysmal atrial fibrillation: Secondary | ICD-10-CM | POA: Diagnosis not present

## 2023-11-12 DIAGNOSIS — F4322 Adjustment disorder with anxiety: Secondary | ICD-10-CM | POA: Diagnosis not present

## 2023-11-12 DIAGNOSIS — E785 Hyperlipidemia, unspecified: Secondary | ICD-10-CM | POA: Diagnosis present

## 2023-11-12 DIAGNOSIS — I2510 Atherosclerotic heart disease of native coronary artery without angina pectoris: Secondary | ICD-10-CM | POA: Diagnosis present

## 2023-11-12 DIAGNOSIS — I4581 Long QT syndrome: Secondary | ICD-10-CM | POA: Diagnosis not present

## 2023-11-12 DIAGNOSIS — R17 Unspecified jaundice: Secondary | ICD-10-CM | POA: Diagnosis present

## 2023-11-12 DIAGNOSIS — G928 Other toxic encephalopathy: Secondary | ICD-10-CM | POA: Diagnosis present

## 2023-11-12 DIAGNOSIS — R21 Rash and other nonspecific skin eruption: Secondary | ICD-10-CM | POA: Diagnosis present

## 2023-11-12 DIAGNOSIS — K219 Gastro-esophageal reflux disease without esophagitis: Secondary | ICD-10-CM | POA: Diagnosis present

## 2023-11-12 DIAGNOSIS — F410 Panic disorder [episodic paroxysmal anxiety] without agoraphobia: Secondary | ICD-10-CM

## 2023-11-12 DIAGNOSIS — D509 Iron deficiency anemia, unspecified: Secondary | ICD-10-CM | POA: Diagnosis present

## 2023-11-12 DIAGNOSIS — I10 Essential (primary) hypertension: Secondary | ICD-10-CM | POA: Diagnosis present

## 2023-11-12 DIAGNOSIS — G9341 Metabolic encephalopathy: Secondary | ICD-10-CM

## 2023-11-12 DIAGNOSIS — I4891 Unspecified atrial fibrillation: Secondary | ICD-10-CM

## 2023-11-12 DIAGNOSIS — I440 Atrioventricular block, first degree: Secondary | ICD-10-CM | POA: Diagnosis not present

## 2023-11-12 DIAGNOSIS — E860 Dehydration: Secondary | ICD-10-CM | POA: Diagnosis present

## 2023-11-12 DIAGNOSIS — M545 Low back pain, unspecified: Secondary | ICD-10-CM | POA: Diagnosis present

## 2023-11-12 DIAGNOSIS — G9340 Encephalopathy, unspecified: Secondary | ICD-10-CM | POA: Diagnosis present

## 2023-11-12 DIAGNOSIS — F419 Anxiety disorder, unspecified: Secondary | ICD-10-CM | POA: Diagnosis not present

## 2023-11-12 DIAGNOSIS — Z681 Body mass index (BMI) 19 or less, adult: Secondary | ICD-10-CM | POA: Diagnosis not present

## 2023-11-12 DIAGNOSIS — R109 Unspecified abdominal pain: Secondary | ICD-10-CM | POA: Diagnosis present

## 2023-11-12 DIAGNOSIS — K589 Irritable bowel syndrome without diarrhea: Secondary | ICD-10-CM | POA: Diagnosis present

## 2023-11-12 LAB — BASIC METABOLIC PANEL WITH GFR
Anion gap: 7 (ref 5–15)
BUN: 22 mg/dL (ref 8–23)
CO2: 22 mmol/L (ref 22–32)
Calcium: 7.9 mg/dL — ABNORMAL LOW (ref 8.9–10.3)
Chloride: 107 mmol/L (ref 98–111)
Creatinine, Ser: 0.92 mg/dL (ref 0.61–1.24)
GFR, Estimated: >60 mL/min (ref >60)
Glucose, Bld: 91 mg/dL (ref 70–99)
Potassium: 3.5 mmol/L (ref 3.5–5.1)
Sodium: 136 mmol/L (ref 135–145)

## 2023-11-12 LAB — HEPATIC FUNCTION PANEL
ALT: 12 U/L (ref 0–44)
AST: 29 U/L (ref 15–41)
Albumin: 3.3 g/dL — ABNORMAL LOW (ref 3.5–5.0)
Alkaline Phosphatase: 64 U/L (ref 38–126)
Bilirubin, Direct: 0.3 mg/dL — ABNORMAL HIGH (ref 0.0–0.2)
Indirect Bilirubin: 1 mg/dL — ABNORMAL HIGH (ref 0.3–0.9)
Total Bilirubin: 1.3 mg/dL — ABNORMAL HIGH (ref 0.0–1.2)
Total Protein: 6.3 g/dL — ABNORMAL LOW (ref 6.5–8.1)

## 2023-11-12 LAB — URINALYSIS, ROUTINE W REFLEX MICROSCOPIC
Bacteria, UA: NONE SEEN
Bilirubin Urine: NEGATIVE
Glucose, UA: NEGATIVE mg/dL
Ketones, ur: NEGATIVE mg/dL
Leukocytes,Ua: NEGATIVE
Nitrite: NEGATIVE
Protein, ur: 30 mg/dL — AB
Specific Gravity, Urine: 1.017 (ref 1.005–1.030)
Squamous Epithelial / HPF: 0 /HPF (ref 0–5)
pH: 6 (ref 5.0–8.0)

## 2023-11-12 LAB — ECHOCARDIOGRAM COMPLETE
AR max vel: 3.12 cm2
AV Area VTI: 3.08 cm2
AV Area mean vel: 2.78 cm2
AV Mean grad: 1 mmHg
AV Peak grad: 2.9 mmHg
Ao pk vel: 0.85 m/s
Area-P 1/2: 5.88 cm2
Height: 70 in
S' Lateral: 2.8 cm
Weight: 2303.37 [oz_av]

## 2023-11-12 LAB — RETICULOCYTES
Immature Retic Fract: 17.2 % — ABNORMAL HIGH (ref 2.3–15.9)
RBC.: 3.33 MIL/uL — ABNORMAL LOW (ref 4.22–5.81)
Retic Count, Absolute: 58.9 10*3/uL (ref 19.0–186.0)
Retic Ct Pct: 1.8 % (ref 0.4–3.1)

## 2023-11-12 LAB — IRON AND TIBC
Iron: 21 ug/dL — ABNORMAL LOW (ref 45–182)
Saturation Ratios: 10 % — ABNORMAL LOW (ref 17.9–39.5)
TIBC: 203 ug/dL — ABNORMAL LOW (ref 250–450)
UIBC: 182 ug/dL

## 2023-11-12 LAB — FOLATE: Folate: 9.7 ng/mL (ref >5.9)

## 2023-11-12 LAB — GLUCOSE, CAPILLARY: Glucose-Capillary: 114 mg/dL — ABNORMAL HIGH (ref 70–99)

## 2023-11-12 LAB — CBC
HCT: 24.9 % — ABNORMAL LOW (ref 39.0–52.0)
Hemoglobin: 8.2 g/dL — ABNORMAL LOW (ref 13.0–17.0)
MCH: 29.6 pg (ref 26.0–34.0)
MCHC: 32.9 g/dL (ref 30.0–36.0)
MCV: 89.9 fL (ref 80.0–100.0)
Platelets: 75 10*3/uL — ABNORMAL LOW (ref 150–400)
RBC: 2.77 MIL/uL — ABNORMAL LOW (ref 4.22–5.81)
RDW: 15.9 % — ABNORMAL HIGH (ref 11.5–15.5)
WBC: 4.6 10*3/uL (ref 4.0–10.5)
nRBC: 0 % (ref 0.0–0.2)

## 2023-11-12 LAB — LIPASE, BLOOD: Lipase: 37 U/L (ref 11–51)

## 2023-11-12 LAB — URINE DRUG SCREEN, QUALITATIVE (ARMC ONLY)
Amphetamines, Ur Screen: NOT DETECTED
Barbiturates, Ur Screen: NOT DETECTED
Benzodiazepine, Ur Scrn: NOT DETECTED
Cannabinoid 50 Ng, Ur ~~LOC~~: POSITIVE — AB
Cocaine Metabolite,Ur ~~LOC~~: NOT DETECTED
MDMA (Ecstasy)Ur Screen: NOT DETECTED
Methadone Scn, Ur: NOT DETECTED
Opiate, Ur Screen: NOT DETECTED
Phencyclidine (PCP) Ur S: NOT DETECTED
Tricyclic, Ur Screen: NOT DETECTED

## 2023-11-12 LAB — FERRITIN: Ferritin: 614 ng/mL — ABNORMAL HIGH (ref 24–336)

## 2023-11-12 LAB — LACTIC ACID, PLASMA
Lactic Acid, Venous: 2.5 mmol/L (ref 0.5–1.9)
Lactic Acid, Venous: 2 mmol/L (ref 0.5–1.9)
Lactic Acid, Venous: 2.2 mmol/L (ref 0.5–1.9)

## 2023-11-12 LAB — TSH: TSH: 2.429 u[IU]/mL (ref 0.350–4.500)

## 2023-11-12 LAB — T4, FREE: Free T4: 0.71 ng/dL (ref 0.61–1.12)

## 2023-11-12 LAB — VITAMIN B12: Vitamin B-12: 660 pg/mL (ref 180–914)

## 2023-11-12 MED ORDER — ASPIRIN 81 MG PO CHEW
81.0000 mg | CHEWABLE_TABLET | ORAL | Status: DC
  Administered 2023-11-13 – 2023-11-15 (×2): 81 mg via ORAL
  Filled 2023-11-12 (×2): qty 1

## 2023-11-12 MED ORDER — ACETAMINOPHEN 650 MG RE SUPP: 650.0000 mg | Freq: Four times a day (QID) | RECTAL | Status: DC | PRN

## 2023-11-12 MED ORDER — IOHEXOL 300 MG/ML  SOLN
100.0000 mL | Freq: Once | INTRAMUSCULAR | Status: AC | PRN
  Administered 2023-11-12: 100 mL via INTRAVENOUS

## 2023-11-12 MED ORDER — ADULT MULTIVITAMIN W/MINERALS CH
1.0000 | ORAL_TABLET | Freq: Every day | ORAL | Status: DC
  Administered 2023-11-13 – 2023-11-15 (×3): 1 via ORAL
  Filled 2023-11-12 (×5): qty 1

## 2023-11-12 MED ORDER — LORAZEPAM 2 MG/ML IJ SOLN
1.0000 mg | Freq: Once | INTRAMUSCULAR | Status: AC
  Administered 2023-11-12: 1 mg via INTRAVENOUS
  Filled 2023-11-12: qty 1

## 2023-11-12 MED ORDER — PANTOPRAZOLE SODIUM 40 MG PO TBEC
40.0000 mg | DELAYED_RELEASE_TABLET | Freq: Every day | ORAL | Status: DC
  Administered 2023-11-12 – 2023-11-15 (×4): 40 mg via ORAL
  Filled 2023-11-12 (×5): qty 1

## 2023-11-12 MED ORDER — SODIUM CHLORIDE 0.9% FLUSH
3.0000 mL | Freq: Two times a day (BID) | INTRAVENOUS | Status: DC
  Administered 2023-11-12 – 2023-11-18 (×14): 3 mL via INTRAVENOUS

## 2023-11-12 MED ORDER — HALOPERIDOL LACTATE 5 MG/ML IJ SOLN
5.0000 mg | Freq: Four times a day (QID) | INTRAMUSCULAR | Status: DC | PRN
  Administered 2023-11-12: 5 mg via INTRAVENOUS
  Filled 2023-11-12: qty 1

## 2023-11-12 MED ORDER — DULOXETINE HCL 30 MG PO CPEP
60.0000 mg | ORAL_CAPSULE | Freq: Every day | ORAL | Status: DC
  Administered 2023-11-13 – 2023-11-15 (×3): 60 mg via ORAL
  Filled 2023-11-12 (×5): qty 2

## 2023-11-12 MED ORDER — SODIUM CHLORIDE 0.9 % IV SOLN: INTRAVENOUS | Status: DC

## 2023-11-12 MED ORDER — LACTATED RINGERS IV BOLUS
1000.0000 mL | Freq: Once | INTRAVENOUS | Status: AC
  Administered 2023-11-12: 1000 mL via INTRAVENOUS

## 2023-11-12 MED ORDER — ENOXAPARIN SODIUM 40 MG/0.4ML IJ SOSY
40.0000 mg | PREFILLED_SYRINGE | INTRAMUSCULAR | Status: DC
  Administered 2023-11-12 – 2023-11-19 (×8): 40 mg via SUBCUTANEOUS
  Filled 2023-11-12 (×8): qty 0.4

## 2023-11-12 MED ORDER — ONDANSETRON HCL 4 MG/2ML IJ SOLN
4.0000 mg | Freq: Four times a day (QID) | INTRAMUSCULAR | Status: DC | PRN
  Administered 2023-11-15: 4 mg via INTRAVENOUS
  Filled 2023-11-12: qty 2

## 2023-11-12 MED ORDER — SODIUM CHLORIDE 0.9 % IV SOLN: INTRAVENOUS | Status: AC

## 2023-11-12 MED ORDER — ONDANSETRON HCL 4 MG/2ML IJ SOLN
4.0000 mg | Freq: Once | INTRAMUSCULAR | Status: AC
Start: 2023-11-12 — End: 2023-11-12
  Administered 2023-11-12: 4 mg via INTRAVENOUS
  Filled 2023-11-12: qty 2

## 2023-11-12 MED ORDER — ACETAMINOPHEN 325 MG PO TABS: 650.0000 mg | ORAL_TABLET | Freq: Four times a day (QID) | ORAL | Status: DC | PRN

## 2023-11-12 MED ORDER — ONDANSETRON HCL 4 MG PO TABS
4.0000 mg | ORAL_TABLET | Freq: Four times a day (QID) | ORAL | Status: DC | PRN
Start: 2023-11-12 — End: 2023-11-19

## 2023-11-12 NOTE — Plan of Care (Signed)
  Problem: Education: Goal: Knowledge of condition and prescribed therapy will improve Outcome: Progressing   Problem: Cardiac: Goal: Will achieve and/or maintain adequate cardiac output Outcome: Progressing   Problem: Physical Regulation: Goal: Complications related to the disease process, condition or treatment will be avoided or minimized Outcome: Progressing   Problem: Education: Goal: Knowledge of General Education information will improve Description: Including pain rating scale, medication(s)/side effects and non-pharmacologic comfort measures Outcome: Progressing   Problem: Health Behavior/Discharge Planning: Goal: Ability to manage health-related needs will improve Outcome: Progressing   Problem: Clinical Measurements: Goal: Ability to maintain clinical measurements within normal limits will improve Outcome: Progressing Goal: Will remain free from infection Outcome: Progressing Goal: Diagnostic test results will improve Outcome: Progressing Goal: Respiratory complications will improve Outcome: Progressing Goal: Cardiovascular complication will be avoided Outcome: Progressing   Problem: Activity: Goal: Risk for activity intolerance will decrease Outcome: Progressing   Problem: Nutrition: Goal: Adequate nutrition will be maintained Outcome: Progressing   Problem: Coping: Goal: Level of anxiety will decrease Outcome: Progressing   Problem: Elimination: Goal: Will not experience complications related to bowel motility Outcome: Progressing Goal: Will not experience complications related to urinary retention Outcome: Progressing   Problem: Pain Managment: Goal: General experience of comfort will improve and/or be controlled Outcome: Progressing   Problem: Safety: Goal: Ability to remain free from injury will improve Outcome: Progressing   Problem: Skin Integrity: Goal: Risk for impaired skin integrity will decrease Outcome: Progressing   Problem:  Education: Goal: Knowledge of disease or condition will improve Outcome: Progressing Goal: Understanding of medication regimen will improve Outcome: Progressing Goal: Individualized Educational Video(s) Outcome: Progressing   Problem: Activity: Goal: Ability to tolerate increased activity will improve Outcome: Progressing   Problem: Cardiac: Goal: Ability to achieve and maintain adequate cardiopulmonary perfusion will improve Outcome: Progressing   Problem: Health Behavior/Discharge Planning: Goal: Ability to safely manage health-related needs after discharge will improve Outcome: Progressing

## 2023-11-12 NOTE — Assessment & Plan Note (Signed)
 No chest pain, troponin negative.  No ischemic changes on EKG Holding aspirin and atorvastatin -to resume when safe to swallow

## 2023-11-12 NOTE — H&P (Signed)
 History and Physical    Patient: Allen David FMW:[id number] DOB: 1927-09-29 DOA: 11/11/2023 DOS: the patient was seen and examined on 11/12/2023 PCP: [facility]  Patient coming from: Home  Chief Complaint:  Chief Complaint  Patient presents with   Nausea   Abdominal Pain    HPI: Allen David is a 89 y.o. male with medical history significant for CAD, IBS HTN, anxiety(tried on multiple meds SSRIs/benzos), psoriasis,, chronic back pain, iron deficiency anemia, being admitted with abdominal pain, nausea and lethargy of uncertain etiology.  Workup with lactic acidosis but otherwise mostly unrevealing.  History provided by daughter and grandson at the bedside who stated he feels started with nausea and abdominal discomfort 2 to 3 days ago, similar to his GERD, that progressively worsened and then on the day of arrival he became very lethargic.   At baseline he is oriented, manages his finances, walks his dogs.  He has no diarrhea, cough or shortness of breath.  Patient unable to contribute to history due to lethargy.  According to records review and daughter at bedside he has been struggling with anxiety since his [place] doctor took him off of Xanax  a year ago.  He had taken it for 30 years.,  At his last PCP visit from 07/2023 he complained of 2-3 anxiety attacks daily.  On 10/16/2023 documented that he self-increased his BuSpar  to 30 mg daily.   In the ER he was initially tachycardic to 124 improving with fluids to 83.  BP initially 130/85 trending down to 107/85.  Labs notable for normal WBC with lactic acid 2.5-->2.0.  Hemoglobin 10.0 not far from his baseline of around 11.  Lipase and LFTs normal except for slightly elevated bilirubin of 1.3.  Creatinine at baseline at 0.99 but with mild metabolic acidosis with bicarb of 18.  Urinalysis sterile.  Troponin 17.  EKG showed A-fib at 100. CT abdomen and pelvis with no acute findings.  Head CT nonacute.  Chest x-ray nonacute Patient treated  with an LR bolus and Zofran . Admission requested.      Past Medical History:  Diagnosis Date   Anxiety    Back injury    with chronic pain   CAD in native artery    Chronic low back pain    Depression    ED (erectile dysfunction)    Elevated PSA    HLD (hyperlipidemia)    HTN (hypertension)    IBS (irritable bowel syndrome)    Malaise and fatigue    Pernicious anemia    Vitamin B deficiency    Past Surgical History:  Procedure Laterality Date   CARPAL TUNNEL RELEASE     EYE SURGERY     VASECTOMY     Social History:  reports that he quit smoking about 57 years ago. His smoking use included cigarettes. He has quit using smokeless tobacco. He reports current alcohol use. He reports that he does not use drugs.  Allergies  Allergen Reactions   Lexapro  [Escitalopram  Oxalate]     hypertension   Tetanus Toxoids Swelling    Family History  Problem Relation Age of Onset   Stroke Mother    Hypertension Mother    Ulcers Mother    Cancer Father        multiple myeloma   Diabetes Sister    Diabetes Brother    Diabetes Brother     Prior to Admission medications   Medication Sig Start Date End Date Taking? Authorizing Provider  amLODipine  (NORVASC )  5 MG tablet Take 1 tablet (5 mg total) by mouth daily. 11/21/22   Maribeth Camellia MATSU, MD  aspirin 81 MG chewable tablet Chew 1 tablet by mouth 1 day or 1 dose. 08/16/10   [provider]  atorvastatin  (LIPITOR) 20 MG tablet Take 1 tablet (20 mg total) by mouth daily. 01/05/18   Gollan, Timothy J, MD  bismuth subsalicylate (PEPTO BISMOL) 262 MG/15ML suspension Take 30 mLs by mouth every 6 (six) hours as needed.    [provider]  busPIRone  (BUSPAR ) 15 MG tablet Take 1 tablet (15 mg total) by mouth 2 (two) times daily. 10/16/23   Kaur, Charanpreet, NP  cyanocobalamin  (VITAMIN B12) 1000 MCG/ML injection Inject 1 mL (1,000 mcg total) into the muscle every 30 (thirty) days. 09/02/23   Marylynn Verneita CROME, MD   hydroxychloroquine  (PLAQUENIL ) 200 MG tablet Take 1 tablet (200 mg total) by mouth 2 (two) times daily. 09/02/21   Maribeth Camellia MATSU, MD  Olopatadine  HCl 0.2 % SOLN Apply 1 drop to eye daily. 04/02/22   Immordino, Garnette, FNP  pantoprazole  (PROTONIX ) 40 MG tablet Take 1 tablet by mouth once daily 09/02/21   Sonnenberg, Eric G, MD  pantoprazole  (PROTONIX ) 40 MG tablet Take 1 tablet by mouth once daily 12/16/21   Webb, Padonda B, FNP  SYRINGE-NEEDLE, DISP, 3 ML (LUER LOCK SAFETY SYRINGES) 25G X 1 3 ML MISC Inject 1 mL into the muscle every 30 (thirty) days. 07/08/23   Maribeth Camellia MATSU, MD    Physical Exam: Vitals:   11/12/23 0030 11/12/23 0100 11/12/23 0130 11/12/23 0200  BP: 113/73 123/81 (!) 130/40 107/85  Pulse: 82 71 76 83  Resp:   18   Temp:      TempSrc:      SpO2: 100% 98% 99% 95%  Weight:      Height:       Physical Exam Vitals and nursing note reviewed.  Constitutional:      General: He is not in acute distress.    Comments: Somnolent but somewhat restless.  Not participating in history taking  HENT:     Head: Normocephalic and atraumatic.   Cardiovascular:     Rate and Rhythm: Normal rate and regular rhythm.     Heart sounds: Normal heart sounds.  Pulmonary:     Effort: Pulmonary effort is normal.     Breath sounds: Normal breath sounds.  Abdominal:     Palpations: Abdomen is soft.     Tenderness: There is no abdominal tenderness.   Neurological:     General: No focal deficit present.     Labs on Admission: I have personally reviewed following labs and imaging studies  CBC: Recent Labs  Lab 11/11/23 2243  WBC 6.5  HGB 10.0*  HCT 30.2*  MCV 89.9  PLT 101*   Basic Metabolic Panel: Recent Labs  Lab 11/11/23 2243  NA 133*  K 3.9  CL 102  CO2 18*  GLUCOSE 123*  BUN 23  CREATININE 0.99  CALCIUM  8.4*   GFR: Estimated Creatinine Clearance: 46.5 mL/min (by C-G formula based on SCr of 0.99 mg/dL). Liver Function Tests: Recent Labs  Lab  11/12/23 0026  AST 29  ALT 12  ALKPHOS 64  BILITOT 1.3*  PROT 6.3*  ALBUMIN 3.3*   Recent Labs  Lab 11/12/23 0026  LIPASE 37   No results for input(s): AMMONIA in the last 168 hours. Coagulation Profile: No results for input(s): INR, PROTIME in the last 168 hours. Cardiac Enzymes:  No results for input(s): CKTOTAL, CKMB, CKMBINDEX, TROPONINI in the last 168 hours. BNP (last 3 results) No results for input(s): PROBNP in the last 8760 hours. HbA1C: No results for input(s): HGBA1C in the last 72 hours. CBG: No results for input(s): GLUCAP in the last 168 hours. Lipid Profile: No results for input(s): CHOL, HDL, LDLCALC, TRIG, CHOLHDL, LDLDIRECT in the last 72 hours. Thyroid  Function Tests: No results for input(s): TSH, T4TOTAL, FREET4, T3FREE, THYROIDAB in the last 72 hours. Anemia Panel: No results for input(s): VITAMINB12, FOLATE, FERRITIN, TIBC, IRON, RETICCTPCT in the last 72 hours. Urine analysis:    Component Value Date/Time   COLORURINE AMBER (A) 11/11/2023 0026   APPEARANCEUR CLEAR (A) 11/11/2023 0026   LABSPEC 1.017 11/11/2023 0026   PHURINE 6.0 11/11/2023 0026   GLUCOSEU NEGATIVE 11/11/2023 0026   HGBUR SMALL (A) 11/11/2023 0026   BILIRUBINUR NEGATIVE 11/11/2023 0026   BILIRUBINUR small 08/01/2022 1703   KETONESUR NEGATIVE 11/11/2023 0026   PROTEINUR 30 (A) 11/11/2023 0026   UROBILINOGEN 1.0 08/01/2022 1703   NITRITE NEGATIVE 11/11/2023 0026   LEUKOCYTESUR NEGATIVE 11/11/2023 0026    Radiological Exams on Admission: CT ABDOMEN PELVIS W CONTRAST Result Date: 11/12/2023 CLINICAL DATA:  Acute abdominal pain and nausea, initial encounter EXAM: CT ABDOMEN AND PELVIS WITH CONTRAST TECHNIQUE: Multidetector CT imaging of the abdomen and pelvis was performed using the standard protocol following bolus administration of intravenous contrast. RADIATION DOSE REDUCTION: This exam was performed according to the  departmental dose-optimization program which includes automated exposure control, adjustment of the mA and/or kV according to patient size and/or use of iterative reconstruction technique. CONTRAST:  OMNIPAQUE IOHEXOL 300 MG/ML  SOLN COMPARISON:  11/18/2016 FINDINGS: Lower chest: Subpleural fibrotic changes are noted. No focal infiltrate is seen. Hepatobiliary: No focal liver abnormality is seen. No gallstones, gallbladder wall thickening, or biliary dilatation. Pancreas: Unremarkable. No pancreatic ductal dilatation or surrounding inflammatory changes. Spleen: Normal in size without focal abnormality. Adrenals/Urinary Tract: Adrenal glands show a stable 10 mm nodule on the left unchanged from 2018 consistent with a benign adenoma. Kidneys demonstrate cystic change bilaterally similar to that seen on the prior study. No follow-up is recommended. No renal calculi or obstructive changes are seen. The bladder is partially distended. Stomach/Bowel: Scattered diverticular change of the colon is noted without evidence of diverticulitis. The appendix is not well visualized and may have been surgically removed. No inflammatory changes to suggest appendicitis are noted. Small bowel and stomach are within normal limits. Vascular/Lymphatic: Aortic atherosclerosis. No enlarged abdominal or pelvic lymph nodes. Reproductive: Prostate is unremarkable. Other: No abdominal wall hernia or abnormality. No abdominopelvic ascites. Musculoskeletal: No acute or significant osseous findings. IMPRESSION: Diverticulosis without diverticulitis. Stable left adrenal nodule consistent with adenoma. No acute abnormality to correspond with the given clinical history is noted. Electronically Signed   By: Oneil Devonshire M.D.   On: 11/12/2023 01:51   CT Head Wo Contrast Result Date: 11/12/2023 CLINICAL DATA:  Mental status change, unknown cause EXAM: CT HEAD WITHOUT CONTRAST TECHNIQUE: Contiguous axial images were obtained from the base of the  skull through the vertex without intravenous contrast. RADIATION DOSE REDUCTION: This exam was performed according to the departmental dose-optimization program which includes automated exposure control, adjustment of the mA and/or kV according to patient size and/or use of iterative reconstruction technique. COMPARISON:  CT head 06/17/2021 FINDINGS: Brain: Cerebral ventricle sizes are concordant with the degree of cerebral volume loss. Trace patchy and confluent areas of decreased attenuation are noted throughout  the deep and periventricular white matter of the cerebral hemispheres bilaterally, compatible with chronic microvascular ischemic disease. No evidence of large-territorial acute infarction. No parenchymal hemorrhage. No mass lesion. No extra-axial collection. No mass effect or midline shift. No hydrocephalus. Basilar cisterns are patent. Empty sella. Vascular: No hyperdense vessel. Atherosclerotic calcifications are present within the cavernous internal carotid and vertebral arteries. Skull: No acute fracture or focal lesion. Sinuses/Orbits: Paranasal sinuses and mastoid air cells are clear. The orbits are unremarkable. Other: None. IMPRESSION: 1. No acute intracranial abnormality. 2. Empty sella. Findings is often a normal anatomic variant but can be associated with idiopathic intracranial hypertension (pseudotumor cerebri). Electronically Signed   By: Morgane  Naveau M.D.   On: 11/12/2023 01:42   DG Chest 1 View Result Date: 11/11/2023 CLINICAL DATA:  Abdominal pain, nausea EXAM: CHEST  1 VIEW COMPARISON:  None Available. FINDINGS: Heart and mediastinal contours within normal limits. No confluent airspace opacities, effusions or edema. No acute bony abnormality. IMPRESSION: No active disease. Electronically Signed   By: Franky Crease M.D.   On: 11/11/2023 23:07   Data Reviewed for HPI: Relevant notes from primary care and specialist visits, past discharge summaries as available in EHR, including  Care Everywhere. Prior diagnostic testing as pertinent to current admission diagnoses Updated medications and problem lists for reconciliation ED course, including vitals, labs, imaging, treatment and response to treatment Triage notes, nursing and pharmacy notes and ED provider's notes Notable results as noted above in HPI      Assessment and Plan: * Acute toxic-metabolic encephalopathy Labs and imaging unrevealing No localizing signs of infection Suspecting possible medication toxicity from antianxiety medications in combination with possible mild dehydration from poor oral intake related to nausea We will hold anxiety meds until mentation closer to baseline and slowly reintroduce Neurologic checks with fall and aspiration precautions Will keep n.p.o. until more awake  Nausea and vomiting Acute lactic acidosis Sepsis not suspected as not meeting other criteria Suspect lactic acidosis secondary to medication versus GI losses Continue IV hydration IV antiemetics, IV Protonix  Will get UDS  Atrial fibrillation, new onset (HCC) EKG showed A-fib at 100--->post hydration EKG showed sinus at 63 Possibly driven by mild dehydration Continuous cardiac monitoring IV hydration If persistent, can consider cardiology consult.  Can consider systemic anticoagulation for stroke prevention  Generalized anxiety disorder with panic attacks PCP reports history of 2-3 anxiety attacks a day with sweating and shakiness Reportedly has been on multiple different meds for anxiety including benzos, Valium, Xanax  which were all discontinued by the [place] Currently on duloxetine  60, and patient self increased his BuSpar  to 30 mg per PCP documentation on 10/16/2023 Given suspicion for medication related encephalopathy, will hold meds tonight to slowly reintroduce once more awake Will try to avoid benzos  IDA (iron deficiency anemia) Hemoglobin near baseline On vitamin B shots We will check anemia  panel  Chronic back pain No acute issues suspected Nonnarcotic pain control  IBS (irritable bowel syndrome) Takes Protonix  and Pepto-Bismol Keeping n.p.o. for now due to nausea  Hypertension Holding home amlodipine  due to softening blood pressure, to resume when appropriate  CAD, NATIVE VESSEL No chest pain, troponin negative.  No ischemic changes on EKG Holding aspirin and atorvastatin -to resume when safe to swallow     DVT prophylaxis: Lovenox  Consults: none  Advance Care Planning: DNR/DNI  Family Communication: daughter and grandson at bedside  Disposition Plan: Back to previous home environment  Severity of Illness: The appropriate patient status for this patient  is OBSERVATION. Observation status is judged to be reasonable and necessary in order to provide the required intensity of service to ensure the patient's safety. The patient's presenting symptoms, physical exam findings, and initial radiographic and laboratory data in the context of their medical condition is felt to place them at decreased risk for further clinical deterioration. Furthermore, it is anticipated that the patient will be medically stable for discharge from the hospital within 2 midnights of admission.   CRITICAL CARE Performed by: Delayne LULLA Solian   Total critical care time: 60 minutes  Critical care time was exclusive of separately billable procedures and treating other patients.  Critical care was necessary to treat or prevent imminent or life-threatening deterioration.  Critical care was time spent personally by me on the following activities: development of treatment plan with patient and/or surrogate as well as nursing, discussions with consultants, evaluation of patient's response to treatment, examination of patient, obtaining history from patient or surrogate, ordering and performing treatments and interventions, ordering and review of laboratory studies, ordering and review of  radiographic studies, pulse oximetry and re-evaluation of patient's condition.  Author: Delayne LULLA Solian, MD 11/12/2023 3:36 AM  For on call review [email or web address].

## 2023-11-12 NOTE — NC FL2 (Signed)
 [place]  MEDICAID FL2 LEVEL OF CARE FORM     IDENTIFICATION  Patient Name: Allen David Birthdate: Oct 09, 1927 Sex: male Admission Date (Current Location): 11/11/2023  [place] and [place] Number:  Chiropodist and Address:  [facility], [address], [place], [place] [address]      Provider Number: [id number]  Attending Physician Name and Address:  Marsa Edelman, DO  Relative Name and Phone Number:  Walden Pool (Daughter)  [phone]    Current Level of Care: Hospital Recommended Level of Care: Skilled Nursing Facility Prior Approval Number:    Date Approved/Denied:   PASRR Number:    Discharge Plan: SNF    Current Diagnoses: Patient Active Problem List   Diagnosis Date Noted   Acute toxic-metabolic encephalopathy 11/12/2023   Chronic back pain 11/12/2023   IDA (iron deficiency anemia) 11/12/2023   Acute lactic acidosis 11/12/2023   Atrial fibrillation, new onset (HCC) 11/12/2023   Generalized anxiety disorder with panic attacks 11/12/2023   Adjustment disorder with anxiety 11/23/2020   History of hypothyroidism 11/09/2020   GERD (gastroesophageal reflux disease) 11/09/2020   Anemia 11/09/2020   Abnormal findings on diagnostic imaging of other parts of musculoskeletal system 10/31/2020   Acute renal failure (HCC) 10/31/2020   Unspecified abnormal findings in urine 10/31/2020   Onychomycosis 07/26/2020   Iron deficiency 07/26/2020   Medication monitoring encounter 07/26/2020   Nausea and vomiting 04/26/2020   B12 deficiency 04/26/2020   Chronic left shoulder pain 11/16/2019   IBS (irritable bowel syndrome) 07/31/2019   Right leg pain 03/25/2019   Branch retinal vein occlusion 12/13/2018   Muscle cramps 11/03/2018   Difficulty hearing 09/20/2018   Leg pain 09/20/2018   Proteinuria 05/30/2018   Carpal tunnel syndrome 05/30/2018   Fatty liver 05/14/2018   Anxiety 05/14/2018   Aortic atherosclerosis (HCC) 01/05/2018    Neuropathy 12/26/2017   Allergic rhinitis 09/16/2017   Muscular chest pain 08/21/2017   Adrenal adenoma 03/11/2017   Incidental lung nodule, > 3mm and < 8mm 03/11/2017   Right foot pain 11/04/2016   Other specified abdominal hernia without obstruction or gangrene 11/04/2016   Skin lesion 01/01/2015   Arthralgia of both knees 09/04/2014   Pernicious anemia 12/20/2013   Hypertension 06/03/2012   Chronic low back pain 06/03/2012   Anxiety and depression 06/03/2012   Hyperlipidemia 01/20/2012   CAD, NATIVE VESSEL 01/09/2010    Orientation RESPIRATION BLADDER Height & Weight     Self  Normal   Weight: 65.3 kg Height:  5' 10 (177.8 cm)  BEHAVIORAL SYMPTOMS/MOOD NEUROLOGICAL BOWEL NUTRITION STATUS  Other (Comment) (Confused, no behavior difficulty noted)     Diet (Dysphasia One)  AMBULATORY STATUS COMMUNICATION OF NEEDS Skin   Extensive Assist Does not communicate (speech is mumbled, difficult to understand) Other (Comment) (Rash on arms, chest, back and neck)                       Personal Care Assistance Level of Assistance  Bathing, Feeding, Dressing, Total care Bathing Assistance: Maximum assistance Feeding assistance: Limited assistance Dressing Assistance: Maximum assistance Total Care Assistance: Maximum assistance   Functional Limitations Info  Sight, Hearing, Speech Sight Info: Adequate Hearing Info: Impaired Speech Info: Impaired    SPECIAL CARE FACTORS FREQUENCY  PT (By licensed PT), OT (By licensed OT)     PT Frequency: 5x day OT Frequency: 5x day            Contractures Contractures Info: Not present  Additional Factors Info  Code Status, Allergies Code Status Info: DNR Limited Allergies Info: Lexapro  (escitalopram  Oxalate) Not Specified   hypertension  Tetanus Toxoids- swelling           Current Medications (11/12/2023):  This is the current hospital active medication list Current Facility-Administered Medications  Medication Dose  Route Frequency Provider Last Rate Last Admin   acetaminophen  (TYLENOL ) tablet 650 mg  650 mg Oral Q6H PRN Duncan, Hazel V, MD       Or   acetaminophen  (TYLENOL ) suppository 650 mg  650 mg Rectal Q6H PRN Duncan, Hazel V, MD       enoxaparin (LOVENOX) injection 40 mg  40 mg Subcutaneous Q24H Duncan, Hazel V, MD   40 mg at 11/12/23 1039   ondansetron  (ZOFRAN ) tablet 4 mg  4 mg Oral Q6H PRN Duncan, Hazel V, MD       Or   ondansetron  (ZOFRAN ) injection 4 mg  4 mg Intravenous Q6H PRN Duncan, Hazel V, MD       sodium chloride  flush (NS) 0.9 % injection 3 mL  3 mL Intravenous Q12H Cleatus Delayne GAILS, MD   3 mL at 11/12/23 0426     Discharge Medications: Please see discharge summary for a list of discharge medications.  Relevant Imaging Results:  Relevant Lab Results:   Additional Information SS# [id number]  Racheal LITTIE Schimke, RN

## 2023-11-12 NOTE — Assessment & Plan Note (Signed)
 Acute lactic acidosis Sepsis not suspected as not meeting other criteria Suspect lactic acidosis secondary to medication versus GI losses Continue IV hydration IV antiemetics, IV Protonix  Will get UDS

## 2023-11-12 NOTE — Assessment & Plan Note (Addendum)
 EKG showed A-fib at 100--->post hydration EKG showed sinus at 63 Possibly driven by mild dehydration Continuous cardiac monitoring IV hydration If persistent, can consider cardiology consult.  Can consider systemic anticoagulation for stroke prevention

## 2023-11-12 NOTE — Assessment & Plan Note (Addendum)
 Labs and imaging unrevealing No localizing signs of infection Suspecting possible medication toxicity from antianxiety medications in combination with possible mild dehydration from poor oral intake related to nausea We will hold anxiety meds until mentation closer to baseline and slowly reintroduce Neurologic checks with fall and aspiration precautions Will keep n.p.o. until more awake

## 2023-11-12 NOTE — IPAL (Signed)
  Interdisciplinary Goals of Care Family Meeting   Date carried out: 11/12/2023  Location of the meeting: Bedside  Member's involved: Physician and Family Member or next of kin  Durable Power of Attorney or Environmental health practitioner: daughter at bedside    Discussion: We discussed goals of care for Ernie JAYSON [place] .   I have reviewed medical records including EPIC notes, labs and imaging, assessed the patient and then met with daughter and grandson to discuss major active diagnoses, plan of care, natural trajectory, prognosis, GOC, EOL wishes, disposition and options including Full code/DNI/DNR and the concept of comfort care if DNR is elected. Questions and concerns were addressed. They are  in agreement to continue current plan of care . Election for DNR/DNI status.   Code status:   Code Status: Limited: Do not attempt resuscitation (DNR) -DNR-LIMITED -Do Not Intubate/DNI    Disposition: Continue current acute care  Time spent for the meeting: 30    Delayne LULLA Solian, MD  11/12/2023, 5:16 AM

## 2023-11-12 NOTE — Progress Notes (Signed)
*  PRELIMINARY RESULTS* Echocardiogram 2D Echocardiogram has been performed.  Allen David 11/12/2023, 2:50 PM

## 2023-11-12 NOTE — Hospital Course (Addendum)
 Hospital course / significant events:   HPI: Allen David is a 89 y.o. male with medical history significant for CAD, IBS HTN, anxiety(tried on multiple meds SSRIs/benzos), psoriasis, chronic back pain, iron deficiency anemia, being admitted with abdominal pain, nausea and lethargy of uncertain etiology. History provided by daughter and grandson - stated he feels started with nausea and abdominal discomfort x2 to 3 days, similar to his GERD, progressively worsened, became very lethargic. At baseline he is oriented, manages his finances, walks his dogs.   06/25: to ED. Workup with lactic acidosis but otherwise essentially unrevealing. AFib rate 100 converted to NSR w/ fluids.  06/26: admitted to hospitalist service early AM. PT/OT recs for SNF rehab. UDS (+)Cannabinoid. Very confused, somnolent, later agitated 06/27: cognition much improved today. Pending placement for SNF rehab.     Consultants:  none  Procedures/Surgeries: none      ASSESSMENT & PLAN:  Acute toxic-metabolic encephalopathy - improved Labs and imaging unrevealing other than UDS (+)Cannabinoids, pt denies use of THC/CBD/D8   No localizing signs of infection Suspecting possible medication toxicity from antianxiety medications in combination with possible mild dehydration from poor oral intake related to nausea TSH and B12 WNL We will hold anxiety meds until mentation closer to baseline and slowly reintroduce   CT Head: chronic microvascular ischemic disease and loss brain volume, likely clinically significant, suspect possible vascular dementia  CT Head: empty sella, uncertain clinical significance but in light of nausea concern may correlate to intracranial HTN though less likely in absence of HA/VC Monitor mental status --> improved  Nausea and vomiting - resolved Acute lactic acidosis Sepsis not suspected as not meeting other criteria Suspect lactic acidosis secondary to medication versus GI losses Symptomatic  care    Atrial fibrillation, new onset  EKG showed A-fib at 100---> post hydration EKG showed sinus at 63. TSH WNL Continuous cardiac monitoring for now   Generalized anxiety disorder with panic attacks PCP reports history of 2-3 anxiety attacks a day with sweating and shakiness Reportedly has been on multiple different meds for anxiety including benzos, Valium, Xanax  which were all discontinued by the [place] Currently on duloxetine  60, and patient self increased his BuSpar  to 30 mg per PCP documentation on 10/16/2023 Given suspicion for medication related encephalopathy, will hold meds to slowly reintroduce once more awake Consider geripsych eval but pt is improving today  Will avoid benzos   IDA (iron deficiency anemia) Hemoglobin near baseline On vitamin B shots and iron at home  Monitor CBC   Chronic back pain No acute issues suspected Nonnarcotic pain control  IBS (irritable bowel syndrome) Takes Protonix  and Pepto-Bismol Symptomatic care    Hypertension Holding home amlodipine  due to lower blood pressure, to resume if/when appropriate   CAD, NATIVE VESSEL No chest pain, troponin negative.  No ischemic changes on EKG Resume ASA   Rash Question guttate psoriasis? Is not consistent w/ meningitis  Monitor    No concenrs based on BMI: Body mass index is 20.66 kg/m.[name] Significantly low or high BMI is associated with higher medical risk.  Underweight - under 18  overweight - 25 to 29 obese - 30 or more Class 1 obesity: BMI of 30.0 to 34 Class 2 obesity: BMI of 35.0 to 39 Class 3 obesity: BMI of 40.0 to 49 Super Morbid Obesity: BMI 50-59 Super-super Morbid Obesity: BMI 60+ Healthy nutrition and physical activity advised as adjunct to other disease management and risk reduction treatments    DVT prophylaxis: lovenox  IV fluids:  have dc continuous IV fluids  Nutrition: per SLP Central lines / other devices: none  Code Status: DNR ACP documentation reviewed:  none on  file in [facility]  [facility] needs: SNF placement  Medical barriers to dispo: none.

## 2023-11-12 NOTE — Evaluation (Addendum)
 Clinical/Bedside Swallow Evaluation Patient Details  Name: Allen David MRN: [id number] Date of Birth: Sep 03, 1927  Today's Date: 11/12/2023 Time: SLP Start Time (ACUTE ONLY): 1240 SLP Stop Time (ACUTE ONLY): 1335 SLP Time Calculation (min) (ACUTE ONLY): 55 min  Past Medical History:  Past Medical History:  Diagnosis Date   Anxiety    Back injury    with chronic pain   CAD in native artery    Chronic low back pain    Depression    ED (erectile dysfunction)    Elevated PSA    HLD (hyperlipidemia)    HTN (hypertension)    IBS (irritable bowel syndrome)    Malaise and fatigue    Pernicious anemia    Vitamin B deficiency    Past Surgical History:  Past Surgical History:  Procedure Laterality Date   CARPAL TUNNEL RELEASE     EYE SURGERY     VASECTOMY     HPI:  Pt is a 89 y.o. male with medical history significant for CAD, IBS HTN, anxiety(tried on multiple meds SSRIs/benzos), psoriasis, fall, chronic back pain, iron deficiency anemia, being admitted with abdominal pain, nausea and lethargy of uncertain etiology.  Workup with lactic acidosis but otherwise mostly unrevealing.  Per report, he had nausea and abdominal discomfort 2 to 3 days ago, similar to his GERD, that progressively worsened and then on the day of arrival he became very lethargic.   At baseline, he is able to manage some ADLs. He has no diarrhea, cough or shortness of breath.  Patient unable to contribute to history due to lethargy.  According to records review and daughter at bedside he has been struggling with anxiety since his [place] doctor took him off of Xanax  a year ago.  He had taken it for 30 years.,  At his last PCP visit from 07/2023 he complained of 2-3 anxiety attacks daily.  On 10/16/2023 documented that he self-increased his BuSpar  to 30 mg daily.  Head CT and CXR are Non-acute; Atrophy with small vessel ischemic changes per MD/Imaging.  Pt admitted w/ dx of  Acute toxic-metabolic encephalopathy.    Assessment /  Plan / Recommendation  Clinical Impression   Pt seen for BSE today. Pt resting in bed in a fetal position w/ covers over his head. He responded to verbal/tactile cues to sit up and held cup to feed himself briefly, then curled up on his side again w/ covers over him. Followed basic 1-step commands; paucity of speech. Oriented to name, city/State of where he lived. Stated we were at a school. He endorsed being thirsty to drink but stated not hungry when asked about foods.  On RA, afebrile. WBC WNL.  Pt appears to present w/ functional oropharyngeal phase swallowing w/ trials given today w/ No overt oropharyngeal phase dysphagia noted, No neuromuscular deficits noted. Pt consumed po trials desired by him w/ No overt, clinical s/s of aspiration during the po trials.  Pt appears at reduced risk for aspiration when following general aspiration precautions w/ a modified diet consistency(at this time). However, pt does have challenging factors that could impact oropharyngeal swallowing to include Acute toxic-metabolic encephalopathy, chronic back pain, deconditioning/weakness, acute illness, GERD, and Advanced Age. These factors can increase risk for dysphagia as well as decreased oral intake overall.   During po trials accepted(pt stated he was not hungry and declined most food trials), pt consumed all consistencies w/ no overt coughing, decline in vocal quality, or change in respiratory presentation during/post trials. O2 sats  remained 99%. Oral phase appeared [facility] w/ timely bolus management and control of bolus propulsion for A-P transfer for swallowing. Oral clearing achieved w/ all trial consistencies -- purees given but no solid foods requiring mastication this session.  OM Exam appeared [facility] w/ no unilateral oral weakness noted during speech, oral clearing. Noted Missing Dentition. Speech intelligible. Pt fed self by holding Cup when drinking w/ setup support.   Recommend initiation of a Pureed  consistency diet w/ well-moistened foods; Thin liquids -- carefully monitor straw use, and pt should Hold Cup when drinking. Recommend general aspiration precautions, including Small sips Slowly. Reduce distractions during meals, less talking. GERD precautions. Tray setup and support at meals as needed, Supervision. Pills CRUSHED in Puree for safer, easier swallowing.   Education given on Pills in Puree; food consistencies and easy to eat options; general aspiration and Reflux precautions to pt. ST services will f/u w/ toleration of diet and trials to upgrade diet. NSG updated, agreed. MD updated. Recommend Dietician f/u for support. Precautions posted in room, chart. SLP Visit Diagnosis: Dysphagia, unspecified (R13.10) (Acute toxic-metabolic encephalopathy)    Aspiration Risk  Mild aspiration risk;Risk for inadequate nutrition/hydration    Diet Recommendation   Thin;Dysphagia 1 (puree) (gravies) = a Pureed consistency diet w/ well-moistened foods; Thin liquids -- carefully monitor straw use, and pt should Hold Cup when drinking. Recommend general aspiration precautions, including Small sips Slowly. Reduce distractions during meals, less talking. GERD precautions. Tray setup and support at meals as needed, Supervision.  Medication Administration: Crushed with puree    Other  Recommendations Recommended Consults:  (Dietician) Oral Care Recommendations: Oral care BID;Oral care before and after PO;Staff/trained caregiver to provide oral care     Assistance Recommended at Discharge  FULL  Functional Status Assessment Patient has had a recent decline in their functional status and demonstrates the ability to make significant improvements in function in a reasonable and predictable amount of time.  Frequency and Duration min 2x/week  2 weeks       Prognosis Prognosis for improved oropharyngeal function: Fair (-Good) Barriers to Reach Goals: Cognitive deficits;Time post onset;Severity of  deficits Barriers/Prognosis Comment: Acute toxic-metabolic encephalopathy; Dentition; lack of desire to eat      Swallow Study   General Date of Onset: 11/11/23 HPI: Pt is a 89 y.o. male with medical history significant for CAD, IBS HTN, anxiety(tried on multiple meds SSRIs/benzos), psoriasis, fall, chronic back pain, iron deficiency anemia, being admitted with abdominal pain, nausea and lethargy of uncertain etiology.  Workup with lactic acidosis but otherwise mostly unrevealing.  Per report, he had nausea and abdominal discomfort 2 to 3 days ago, similar to his GERD, that progressively worsened and then on the day of arrival he became very lethargic.   At baseline, he is able to manage some ADLs. He has no diarrhea, cough or shortness of breath.  Patient unable to contribute to history due to lethargy.  According to records review and daughter at bedside he has been struggling with anxiety since his [place] doctor took him off of Xanax  a year ago.  He had taken it for 30 years.,  At his last PCP visit from 07/2023 he complained of 2-3 anxiety attacks daily.  On 10/16/2023 documented that he self-increased his BuSpar  to 30 mg daily.  Head CT and CXR are Non-acute; Atrophy with small vessel ischemic changes per MD/Imaging.  Pt admitted w/ dx of  Acute toxic-metabolic encephalopathy. Type of Study: Bedside Swallow Evaluation Previous Swallow Assessment: none Diet  Prior to this Study: NPO (unsure of home diet) Temperature Spikes Noted: No (wbc 6.5) Respiratory Status: Room air History of Recent Intubation: No Behavior/Cognition: Alert;Cooperative;Pleasant mood;Confused;Distractible;Requires cueing Oral Cavity Assessment: Dry Oral Care Completed by SLP: Yes Oral Cavity - Dentition: Missing dentition Vision: Functional for self-feeding Self-Feeding Abilities: Able to feed self;Needs assist;Needs set up (full setup and support) Patient Positioning: Upright in bed (full assist) Baseline Vocal Quality: Normal  (min mumbled) Volitional Cough: Strong    Oral/Motor/Sensory Function Overall Oral Motor/Sensory Function:  (no unilateral weakness noted during speech/oral clearing)   Ice Chips Ice chips: Within functional limits Presentation: Spoon (fed; 2 trials)   Thin Liquid Thin Liquid: Within functional limits Presentation: Self Fed;Straw (~11-12 trials total) Other Comments: water, chocolate milk    Nectar Thick Nectar Thick Liquid: Not tested   Honey Thick Honey Thick Liquid: Not tested   Puree Puree: Within functional limits Presentation: Spoon (fed; 5 trials)   Solid     Solid: Not tested        [name], MS, CCC-SLP Speech Language Pathologist Rehab Services; [facility] [phone] (ascom) [place] 11/12/2023,3:30 PM

## 2023-11-12 NOTE — Assessment & Plan Note (Signed)
 Holding home amlodipine  due to softening blood pressure, to resume when appropriate

## 2023-11-12 NOTE — Assessment & Plan Note (Signed)
 PCP reports history of 2-3 anxiety attacks a day with sweating and shakiness Reportedly has been on multiple different meds for anxiety including benzos, Valium, Xanax  which were all discontinued by the [place] Currently on duloxetine  60, and patient self increased his BuSpar  to 30 mg per PCP documentation on 10/16/2023 Given suspicion for medication related encephalopathy, will hold meds tonight to slowly reintroduce once more awake Will try to avoid benzos

## 2023-11-12 NOTE — Progress Notes (Signed)
 PROGRESS NOTE    Allen David   FMW:[id number] DOB: [birth year]  DOA: 11/11/2023 Date of Service: 11/12/23 which is hospital day 0  PCP: Clinic, [facility] course / significant events:   HPI: Allen David is a 89 y.o. male with medical history significant for CAD, IBS HTN, anxiety(tried on multiple meds SSRIs/benzos), psoriasis, chronic back pain, iron deficiency anemia, being admitted with abdominal pain, nausea and lethargy of uncertain etiology. History provided by daughter and grandson - stated he feels started with nausea and abdominal discomfort x2 to 3 days, similar to his GERD, progressively worsened, became very lethargic. At baseline he is oriented, manages his finances, walks his dogs.   06/25: to ED. Workup with lactic acidosis but otherwise essentially unrevealing. AFib rate 100 converted to NSR w/ fluids.  06/26: admitted to hospitalist service early AM. PT/OT recs for SNF rehab. UDS (+)Cannabinoid    Consultants:  none  Procedures/Surgeries: none      ASSESSMENT & PLAN:  Acute toxic-metabolic encephalopathy Labs and imaging unrevealing other than UDS (+)Cannabinoids  No localizing signs of infection Suspecting possible medication toxicity from antianxiety medications in combination with possible mild dehydration from poor oral intake related to nausea TSH and B12 WNL We will hold anxiety meds until mentation closer to baseline and slowly reintroduce Neurologic checks with fall and aspiration precautions SLP eval --> ok for diet    CT Head: chronic microvascular ischemic disease and loss brain volume, likely clinically significant, suspect possible vascular dementia  CT Head: empty sella, uncertain clinical significance but in light of nausea concern may correlate to intracranial HTN though less likely in absence of HA/VC Monitor mental status  Low threshold for neuro consult   Nausea and vomiting - resolved Acute lactic  acidosis Sepsis not suspected as not meeting other criteria Suspect lactic acidosis secondary to medication versus GI losses Symptomatic care    Atrial fibrillation, new onset (HCC) EKG showed A-fib at 100---> post hydration EKG showed sinus at 63. TSH WNL Continuous cardiac monitoring for now   Generalized anxiety disorder with panic attacks PCP reports history of 2-3 anxiety attacks a day with sweating and shakiness Reportedly has been on multiple different meds for anxiety including benzos, Valium, Xanax  which were all discontinued by the [place] Currently on duloxetine  60, and patient self increased his BuSpar  to 30 mg per PCP documentation on 10/16/2023 Given suspicion for medication related encephalopathy, will hold meds to slowly reintroduce once more awake Consider geripsych eval  Will avoid benzos   IDA (iron deficiency anemia) Hemoglobin near baseline On vitamin B shots and iron at home  Monitor CBC   Chronic back pain No acute issues suspected Nonnarcotic pain control  IBS (irritable bowel syndrome) Takes Protonix  and Pepto-Bismol Symptomatic care    Hypertension Holding home amlodipine  due to softening blood pressure, to resume when appropriate   CAD, NATIVE VESSEL No chest pain, troponin negative.  No ischemic changes on EKG Resume ASA   Rash Question guttate psoriasis? Is not consistent w/ meningitis  Monitor    No concenrs based on BMI: Body mass index is 20.66 kg/m.[name] Significantly low or high BMI is associated with higher medical risk.  Underweight - under 18  overweight - 25 to 29 obese - 30 or more Class 1 obesity: BMI of 30.0 to 34 Class 2 obesity: BMI of 35.0 to 39 Class 3 obesity: BMI of 40.0 to 49 Super Morbid Obesity: BMI 50-59 Super-super Morbid Obesity: BMI 60+ Healthy  nutrition and physical activity advised as adjunct to other disease management and risk reduction treatments    DVT prophylaxis: lovenox IV fluids: have dc continuous IV  fluids  Nutrition: per SLP Central lines / other devices: none  Code Status: DNR ACP documentation reviewed:  none on file in [facility]  [facility] needs: SNF placement  Medical barriers to dispo: encephalopathy. Expected medical readiness for discharge couple days .              Subjective / Brief ROS:  Patient is answering sure or yes to all questions    Family Communication: attempted call to daughter Allen David 11/12/23 2:24 PM left HIPAA compliant voicemail     Objective Findings:  Vitals:   11/12/23 0300 11/12/23 0412 11/12/23 0804 11/12/23 1130  BP: (!) 145/121 (!) 128/98 113/82 (!) 126/51  Pulse: 66 65 64 67  Resp:  16 18 18   Temp:  98.3 F (36.8 C) 98 F (36.7 C) 98 F (36.7 C)  TempSrc:      SpO2: 96% 92% 98% 100%  Weight:  65.3 kg    Height:  5' 10 (1.778 m)      Intake/Output Summary (Last 24 hours) at 11/12/2023 1424 Last data filed at 11/12/2023 1139 Gross per 24 hour  Intake 1060.85 ml  Output --  Net 1060.85 ml   Filed Weights   11/11/23 2239 11/12/23 0412  Weight: 72 kg 65.3 kg    Examination:  Physical Exam Constitutional:      General: He is not in acute distress.  Cardiovascular:     Rate and Rhythm: Normal rate and regular rhythm.  Pulmonary:     Effort: Pulmonary effort is normal.     Breath sounds: Normal breath sounds.  Abdominal:     General: Bowel sounds are normal.     Palpations: Abdomen is soft.     Tenderness: There is no abdominal tenderness.   Skin:    General: Skin is warm and dry.     Findings: Rash (dry patches scattered across skin) present.   Neurological:     Mental Status: He is alert. He is disoriented.          Scheduled Medications:   aspirin  81 mg Oral 1 day or 1 dose   DULoxetine   60 mg Oral Daily   enoxaparin (LOVENOX) injection  40 mg Subcutaneous Q24H   multivitamin with minerals  1 tablet Oral Daily   pantoprazole   40 mg Oral Daily   sodium chloride  flush  3 mL Intravenous Q12H     Continuous Infusions:   PRN Medications:  acetaminophen  **OR** acetaminophen , ondansetron  **OR** ondansetron  (ZOFRAN ) IV  Antimicrobials from admission:  Anti-infectives (From admission, onward)    None           Data Reviewed:  I have personally reviewed the following...  CBC: Recent Labs  Lab 11/11/23 2243  WBC 6.5  HGB 10.0*  HCT 30.2*  MCV 89.9  PLT 101*   Basic Metabolic Panel: Recent Labs  Lab 11/11/23 2243  NA 133*  K 3.9  CL 102  CO2 18*  GLUCOSE 123*  BUN 23  CREATININE 0.99  CALCIUM  8.4*   GFR: Estimated Creatinine Clearance: 42.1 mL/min (by C-G formula based on SCr of 0.99 mg/dL). Liver Function Tests: Recent Labs  Lab 11/12/23 0026  AST 29  ALT 12  ALKPHOS 64  BILITOT 1.3*  PROT 6.3*  ALBUMIN 3.3*   Recent Labs  Lab 11/12/23 0026  LIPASE 37  No results for input(s): AMMONIA in the last 168 hours. Coagulation Profile: No results for input(s): INR, PROTIME in the last 168 hours. Cardiac Enzymes: No results for input(s): CKTOTAL, CKMB, CKMBINDEX, TROPONINI in the last 168 hours. BNP (last 3 results) No results for input(s): PROBNP in the last 8760 hours. HbA1C: No results for input(s): HGBA1C in the last 72 hours. CBG: Recent Labs  Lab 11/12/23 0519  GLUCAP 114*   Lipid Profile: No results for input(s): CHOL, HDL, LDLCALC, TRIG, CHOLHDL, LDLDIRECT in the last 72 hours. Thyroid  Function Tests: Recent Labs    11/12/23 0026  TSH 2.429  FREET4 0.71   Anemia Panel: Recent Labs    11/11/23 2243 11/12/23 0026 11/12/23 0528  VITAMINB12  --   --  660  FOLATE  --  9.7  --   FERRITIN  --  614*  --   TIBC  --  203*  --   IRON  --  21*  --   RETICCTPCT 1.8  --   --    Most Recent Urinalysis On File:     Component Value Date/Time   COLORURINE AMBER (A) 11/11/2023 0026   APPEARANCEUR CLEAR (A) 11/11/2023 0026   LABSPEC 1.017 11/11/2023 0026   PHURINE 6.0 11/11/2023 0026   GLUCOSEU  NEGATIVE 11/11/2023 0026   HGBUR SMALL (A) 11/11/2023 0026   BILIRUBINUR NEGATIVE 11/11/2023 0026   BILIRUBINUR small 08/01/2022 1703   KETONESUR NEGATIVE 11/11/2023 0026   PROTEINUR 30 (A) 11/11/2023 0026   UROBILINOGEN 1.0 08/01/2022 1703   NITRITE NEGATIVE 11/11/2023 0026   LEUKOCYTESUR NEGATIVE 11/11/2023 0026   Sepsis Labs: @LABRCNTIP (procalcitonin:4,lacticidven:4) Microbiology: No results found for this or any previous visit (from the past 240 hours).    Radiology Studies last 3 days: CT ABDOMEN PELVIS W CONTRAST Result Date: 11/12/2023 CLINICAL DATA:  Acute abdominal pain and nausea, initial encounter EXAM: CT ABDOMEN AND PELVIS WITH CONTRAST TECHNIQUE: Multidetector CT imaging of the abdomen and pelvis was performed using the standard protocol following bolus administration of intravenous contrast. RADIATION DOSE REDUCTION: This exam was performed according to the departmental dose-optimization program which includes automated exposure control, adjustment of the mA and/or kV according to patient size and/or use of iterative reconstruction technique. CONTRAST:  OMNIPAQUE IOHEXOL 300 MG/ML  SOLN COMPARISON:  11/18/2016 FINDINGS: Lower chest: Subpleural fibrotic changes are noted. No focal infiltrate is seen. Hepatobiliary: No focal liver abnormality is seen. No gallstones, gallbladder wall thickening, or biliary dilatation. Pancreas: Unremarkable. No pancreatic ductal dilatation or surrounding inflammatory changes. Spleen: Normal in size without focal abnormality. Adrenals/Urinary Tract: Adrenal glands show a stable 10 mm nodule on the left unchanged from 2018 consistent with a benign adenoma. Kidneys demonstrate cystic change bilaterally similar to that seen on the prior study. No follow-up is recommended. No renal calculi or obstructive changes are seen. The bladder is partially distended. Stomach/Bowel: Scattered diverticular change of the colon is noted without evidence of  diverticulitis. The appendix is not well visualized and may have been surgically removed. No inflammatory changes to suggest appendicitis are noted. Small bowel and stomach are within normal limits. Vascular/Lymphatic: Aortic atherosclerosis. No enlarged abdominal or pelvic lymph nodes. Reproductive: Prostate is unremarkable. Other: No abdominal wall hernia or abnormality. No abdominopelvic ascites. Musculoskeletal: No acute or significant osseous findings. IMPRESSION: Diverticulosis without diverticulitis. Stable left adrenal nodule consistent with adenoma. No acute abnormality to correspond with the given clinical history is noted. Electronically Signed   By: Oneil Devonshire M.D.   On: 11/12/2023 01:51  CT Head Wo Contrast Result Date: 11/12/2023 CLINICAL DATA:  Mental status change, unknown cause EXAM: CT HEAD WITHOUT CONTRAST TECHNIQUE: Contiguous axial images were obtained from the base of the skull through the vertex without intravenous contrast. RADIATION DOSE REDUCTION: This exam was performed according to the departmental dose-optimization program which includes automated exposure control, adjustment of the mA and/or kV according to patient size and/or use of iterative reconstruction technique. COMPARISON:  CT head 06/17/2021 FINDINGS: Brain: Cerebral ventricle sizes are concordant with the degree of cerebral volume loss. Trace patchy and confluent areas of decreased attenuation are noted throughout the deep and periventricular white matter of the cerebral hemispheres bilaterally, compatible with chronic microvascular ischemic disease. No evidence of large-territorial acute infarction. No parenchymal hemorrhage. No mass lesion. No extra-axial collection. No mass effect or midline shift. No hydrocephalus. Basilar cisterns are patent. Empty sella. Vascular: No hyperdense vessel. Atherosclerotic calcifications are present within the cavernous internal carotid and vertebral arteries. Skull: No acute fracture  or focal lesion. Sinuses/Orbits: Paranasal sinuses and mastoid air cells are clear. The orbits are unremarkable. Other: None. IMPRESSION: 1. No acute intracranial abnormality. 2. Empty sella. Findings is often a normal anatomic variant but can be associated with idiopathic intracranial hypertension (pseudotumor cerebri). Electronically Signed   By: Morgane  Naveau M.D.   On: 11/12/2023 01:42   DG Chest 1 View Result Date: 11/11/2023 CLINICAL DATA:  Abdominal pain, nausea EXAM: CHEST  1 VIEW COMPARISON:  None Available. FINDINGS: Heart and mediastinal contours within normal limits. No confluent airspace opacities, effusions or edema. No acute bony abnormality. IMPRESSION: No active disease. Electronically Signed   By: Franky Crease M.D.   On: 11/11/2023 23:07       Time spent: 35 min     Kenzel Ruesch, DO [facility] 11/12/2023, 2:24 PM    Dictation software may have been used to generate the above note. Typos may occur and escape review in typed/dictated notes. Please contact Dr Marsa directly for clarity if needed.  Staff may message me via secure chat in Epic  but this may not receive an immediate response,  please page me for urgent matters!  If 7PM-7AM, please contact night coverage [email or web address]

## 2023-11-12 NOTE — Evaluation (Signed)
 Occupational Therapy Evaluation Patient Details Name: Allen David MRN: [id number] DOB: 03-11-1928 Today's Date: 11/12/2023   History of Present Illness   Pt is a 89 yo male that presented to ED for abdominal pain, nausea, lethargy. Workup for acute toxic metabolic encephalopathy, noted for new onset afib as well. PMH of  CAD, IBS HTN, anxiety(tried on multiple meds SSRIs/benzos), psoriasis, chronic back pain, iron deficiency anemia     Clinical Impressions Pt was seen for OT/PT evaluation this date. Pt alert and oriented to self only, phone call post session with daughter whom pt lives with to record PLOF/home setup as well as discharge planning. Prior to hospital admission, pt was MODI for all ADL/IADLs, utilizing a scooter for community mobility and a RW within household distances. Pt lives with his daughter in one level home with 2 steps to enter. Pt presents to acute OT demonstrating impaired ADL performance, activity tolerance and functional mobility 2/2 (See OT problem list for additional functional deficits).Pt's wore bil mitts on hands throughout session, with heavy cuing to keep them on. MAXA for donning socks at bed level. Pt currently requires MINA for bed mobility when transitioning from supine <> sitting on the EOB, sequencing verbal cues for technique. Pt required CGA +2 for safety from STS from the EOB with use of RW, pt benefited from multimodal cues for hand placement and DME management. Pt amb within room ~82ft with CGA, fatigued quickly and reports pain in his bilateral LE and back. Pt eager to rest post amb, returned to bed, lying on his side with pillow in between his legs to aid in positioning and support of his spine. Pt would benefit from skilled OT services to address noted impairments and functional limitations (see below for any additional details) in order to maximize safety and independence while minimizing falls risk and caregiver burden. OT will follow acutely.     If  plan is discharge home, recommend the following:   A lot of help with walking and/or transfers;A lot of help with bathing/dressing/bathroom;Direct supervision/assist for medications management;Direct supervision/assist for financial management;Assist for transportation;Supervision due to cognitive status     Functional Status Assessment   Patient has had a recent decline in their functional status and demonstrates the ability to make significant improvements in function in a reasonable and predictable amount of time.     Equipment Recommendations   Other (comment) (Defer to next venue of care)     Recommendations for Other Services         Precautions/Restrictions   Precautions Precautions: Fall Recall of Precautions/Restrictions: Impaired Restrictions Weight Bearing Restrictions Per Provider Order: No     Mobility Bed Mobility Overal bed mobility: Needs Assistance Bed Mobility: Supine to Sit, Sit to Supine     Supine to sit: Min assist, HOB elevated Sit to supine: Contact guard assist, HOB elevated   General bed mobility comments: Extra time to return to bed but no physical assistance required    Transfers Overall transfer level: Needs assistance Equipment used: Rolling walker (2 wheels) Transfers: Sit to/from Stand Sit to Stand: Contact guard assist           General transfer comment: STS from low bed level with RW      Balance Overall balance assessment: Needs assistance Sitting-balance support: Feet supported Sitting balance-Leahy Scale: Fair     Standing balance support: Bilateral upper extremity supported Standing balance-Leahy Scale: Poor  ADL either performed or assessed with clinical judgement   ADL Overall ADL's : Needs assistance/impaired     Grooming: Wash/dry face;Maximal assistance;Bed level Grooming Details (indicate cue type and reason): Due to bilateral mits for pt's safety              Lower Body Dressing: Maximal assistance;Bed level Lower Body Dressing Details (indicate cue type and reason): Donning sock as bed level Toilet Transfer: Ambulation;Rolling walker (2 wheels);Cueing for safety;Contact guard assist Toilet Transfer Details (indicate cue type and reason): Simulated toilet transfer with RW + CGA         Functional mobility during ADLs: Rolling walker (2 wheels);Contact guard assist General ADL Comments: CGA during standing with RW, MAXA for bed level/seated ADLs     Vision         Perception         Praxis         Pertinent Vitals/Pain Pain Assessment Pain Assessment: Faces Faces Pain Scale: Hurts a little bit Pain Location: Back and bilateral LE pain Pain Descriptors / Indicators: Discomfort Pain Intervention(s): Limited activity within patient's tolerance, Monitored during session, Repositioned     Extremity/Trunk Assessment Upper Extremity Assessment Upper Extremity Assessment: Generalized weakness   Lower Extremity Assessment Lower Extremity Assessment: Generalized weakness (able to lift BLE against gravity)   Cervical / Trunk Assessment Cervical / Trunk Assessment: Normal   Communication Communication Communication: Impaired Factors Affecting Communication: Reduced clarity of speech   Cognition Arousal: Alert Behavior During Therapy: Impulsive, WFL for tasks assessed/performed Cognition: Cognition impaired   Orientation impairments: Time, Situation, Place Awareness: Intellectual awareness impaired Memory impairment (select all impairments): Short-term memory Attention impairment (select first level of impairment): Focused attention, Sustained attention Executive functioning impairment (select all impairments): Problem solving, Reasoning, Organization, Initiation, Sequencing OT - Cognition Comments: A/Ox1 self only                 Following commands: Impaired Following commands impaired: Follows one step commands  inconsistently     Cueing  General Comments   Cueing Techniques: Verbal cues;Tactile cues;Gestural cues  Noted red rash generally covering pt's whole body   Exercises Exercises: Other exercises Other Exercises Other Exercises: Edu: Role of OT eval, safety transfer techniques with use of RW, pacing amb. Other Exercises: Phone call with pt's daughter to obtain PLOF/home set up.   Shoulder Instructions      Home Living Family/patient expects to be discharged to:: Private residence Living Arrangements: Children Available Help at Discharge: Available PRN/intermittently (work during day) Type of Home: House Home Access: Stairs to enter Secretary/administrator of Steps: 4 Entrance Stairs-Rails: Can reach both Home Layout: One level     Bathroom Shower/Tub: Engineer, production Accessibility: Yes How Accessible: Accessible via wheelchair;Accessible via walker Home Equipment: Grab bars - tub/shower;Hand held shower head;Shower Counsellor (2 wheels);Wheelchair - manual          Prior Functioning/Environment Prior Level of Function : History of Falls (last six months);Independent/Modified Independent             Mobility Comments: RW use within home, scooter with community. ADLs Comments: Indep    OT Problem List: Decreased strength;Decreased range of motion;Decreased activity tolerance;Impaired balance (sitting and/or standing);Decreased coordination;Decreased cognition;Decreased safety awareness;Decreased knowledge of use of DME or AE;Decreased knowledge of precautions   OT Treatment/Interventions: Self-care/ADL training;Therapeutic exercise;Energy conservation;DME and/or AE instruction;Therapeutic activities;Cognitive remediation/compensation;Patient/family education;Balance training      OT Goals(Current goals can be found in the  care plan section)   Acute Rehab OT Goals Patient Stated Goal: go to rehab OT Goal Formulation: With  patient/family Time For Goal Achievement: 11/26/23 Potential to Achieve Goals: Good ADL Goals Pt Will Perform Grooming: standing;with supervision Pt Will Perform Lower Body Dressing: with supervision;sit to/from stand Pt Will Transfer to Toilet: with supervision;ambulating Pt Will Perform Toileting - Clothing Manipulation and hygiene: with supervision;sit to/from stand   OT Frequency:  Min 2X/week    Co-evaluation PT/OT/SLP Co-Evaluation/Treatment: Yes Reason for Co-Treatment: Necessary to address cognition/behavior during functional activity;For patient/therapist safety PT goals addressed during session: Mobility/safety with mobility;Balance OT goals addressed during session: ADL's and self-care      AM-PAC OT 6 Clicks Daily Activity     Outcome Measure Help from another person eating meals?: A Little Help from another person taking care of personal grooming?: A Lot Help from another person toileting, which includes using toliet, bedpan, or urinal?: A Lot Help from another person bathing (including washing, rinsing, drying)?: A Lot Help from another person to put on and taking off regular upper body clothing?: A Little Help from another person to put on and taking off regular lower body clothing?: A Lot 6 Click Score: 14   End of Session Equipment Utilized During Treatment: Gait belt;Rolling walker (2 wheels)  Activity Tolerance: Patient tolerated treatment well Patient left: in bed;with call bell/phone within reach;with bed alarm set (mits remained on)  OT Visit Diagnosis: Unsteadiness on feet (R26.81);Other abnormalities of gait and mobility (R26.89);Other symptoms and signs involving cognitive function                Time: 0958-1010 OT Time Calculation (min): 12 min Charges:  OT General Charges $OT Visit: 1 Visit OT Evaluation $OT Eval Low Complexity: 1 Low  Larraine Colas M.S. OTR/L  11/12/23, 12:13 PM

## 2023-11-12 NOTE — Evaluation (Signed)
 Physical Therapy Evaluation Patient Details Name: Allen David MRN: [id number] DOB: [birth year] Today's Date: 11/12/2023  History of Present Illness  Pt is a 89 yo male that presented to ED for abdominal pain, nausea, lethargy. Workup for acute toxic metabolic encephalopathy, noted for new onset afib as well. PMH of  CAD, IBS HTN, anxiety(tried on multiple meds SSRIs/benzos), psoriasis, chronic back pain, iron deficiency anemia.   Clinical Impression  Pt alert, oriented to name only. Impulsive and restless during session, able to be redirected but often fixated on attempting to remove soft mitts. Unable to provide PLOF, OT confirmed via phone call with family he lives with his daughter, and was previously ambulating with RW, scooter for community, and independent in ADLs. Pt required minA to come up into sitting safely. Sit <> stand with RW and CGAx2, effortful for pt and pulls on RW. He self selected a distance ~45ft, appeared fatigued, decreased step length/height noted, and reported back pain. Returned to supine with needs in reach, bed alarm on.  Overall the patient demonstrated deficits (see PT Problem List) that impede the patient's functional abilities, safety, and mobility and would benefit from skilled PT intervention.          If plan is discharge home, recommend the following: A lot of help with walking and/or transfers;A lot of help with bathing/dressing/bathroom;Assistance with feeding;Assist for transportation;Assistance with cooking/housework;Help with stairs or ramp for entrance;Supervision due to cognitive status   Can travel by private vehicle   No    Equipment Recommendations Other (comment) (TBD)  Recommendations for Other Services       Functional Status Assessment Patient has had a recent decline in their functional status and demonstrates the ability to make significant improvements in function in a reasonable and predictable amount of time.     Precautions /  Restrictions Precautions Precautions: Fall Recall of Precautions/Restrictions: Impaired Restrictions Weight Bearing Restrictions Per Provider Order: No      Mobility  Bed Mobility Overal bed mobility: Needs Assistance Bed Mobility: Supine to Sit, Sit to Supine     Supine to sit: Min assist, HOB elevated Sit to supine: Contact guard assist, HOB elevated   General bed mobility comments: Extra time to return to bed but no physical assistance required    Transfers Overall transfer level: Needs assistance Equipment used: Rolling walker (2 wheels) Transfers: Sit to/from Stand Sit to Stand: Contact guard assist, +2 safety/equipment           General transfer comment: STS from low bed level with RW,    Ambulation/Gait Ambulation/Gait assistance: Contact guard assist, +2 safety/equipment Gait Distance (Feet): 12 Feet Assistive device: Rolling walker (2 wheels)         General Gait Details: pt easily distracted, fatigued quickly, self selected distance  Stairs            Wheelchair Mobility     Tilt Bed    Modified Rankin (Stroke Patients Only)       Balance Overall balance assessment: Needs assistance Sitting-balance support: Feet supported Sitting balance-Leahy Scale: Fair     Standing balance support: Bilateral upper extremity supported, During functional activity Standing balance-Leahy Scale: Poor                               Pertinent Vitals/Pain Pain Assessment Pain Assessment: Faces Faces Pain Scale: Hurts a little bit Pain Location: Back and bilateral LE pain Pain Descriptors / Indicators: Discomfort, Sore,  Grimacing Pain Intervention(s): Limited activity within patient's tolerance, Monitored during session, Repositioned    Home Living Family/patient expects to be discharged to:: Private residence Living Arrangements: Children Available Help at Discharge: Available PRN/intermittently (work during day) Type of Home:  House Home Access: Stairs to enter Entrance Stairs-Rails: Can reach both Entrance Stairs-Number of Steps: 4   Home Layout: One level Home Equipment: Grab bars - tub/shower;Hand held shower head;Shower Counsellor (2 wheels);Wheelchair - manual      Prior Function Prior Level of Function : History of Falls (last six months);Independent/Modified Independent             Mobility Comments: RW use within home, scooter with community. ADLs Comments: Indep     Extremity/Trunk Assessment   Upper Extremity Assessment Upper Extremity Assessment: Generalized weakness    Lower Extremity Assessment Lower Extremity Assessment: Generalized weakness (able to lift BLE against gravity)    Cervical / Trunk Assessment Cervical / Trunk Assessment: Normal  Communication   Communication Communication: Impaired Factors Affecting Communication: Reduced clarity of speech    Cognition Arousal: Alert Behavior During Therapy: Restless, Impulsive                           PT - Cognition Comments: oriented to self only Following commands: Impaired       Cueing Cueing Techniques: Verbal cues, Tactile cues, Gestural cues     General Comments      Exercises     Assessment/Plan    PT Assessment Patient needs continued PT services  PT Problem List Decreased strength;Decreased activity tolerance;Decreased balance;Decreased mobility       PT Treatment Interventions DME instruction;Balance training;Gait training;Neuromuscular re-education;Stair training;Functional mobility training;Patient/family education;Therapeutic activities;Therapeutic exercise    PT Goals (Current goals can be found in the Care Plan section)  Acute Rehab PT Goals Patient Stated Goal: to return to PLOF PT Goal Formulation: With family Time For Goal Achievement: 11/26/23 Potential to Achieve Goals: Good    Frequency Min 2X/week     Co-evaluation PT/OT/SLP Co-Evaluation/Treatment:  Yes Reason for Co-Treatment: Necessary to address cognition/behavior during functional activity;For patient/therapist safety PT goals addressed during session: Mobility/safety with mobility;Balance OT goals addressed during session: ADL's and self-care       AM-PAC PT 6 Clicks Mobility  Outcome Measure Help needed turning from your back to your side while in a flat bed without using bedrails?: A Little Help needed moving from lying on your back to sitting on the side of a flat bed without using bedrails?: A Little Help needed moving to and from a bed to a chair (including a wheelchair)?: A Little Help needed standing up from a chair using your arms (e.g., wheelchair or bedside chair)?: A Little Help needed to walk in hospital room?: A Little Help needed climbing 3-5 steps with a railing? : A Lot 6 Click Score: 17    End of Session Equipment Utilized During Treatment: Gait belt Activity Tolerance: Patient limited by fatigue Patient left: in bed;with call bell/phone within reach;with bed alarm set Nurse Communication: Mobility status PT Visit Diagnosis: Other abnormalities of gait and mobility (R26.89);Difficulty in walking, not elsewhere classified (R26.2);Muscle weakness (generalized) (M62.81)    Time: 9044-8990 PT Time Calculation (min) (ACUTE ONLY): 14 min   Charges:   PT Evaluation $PT Eval Low Complexity: 1 Low   PT General Charges $$ ACUTE PT VISIT: 1 Visit        Doyal Shams PT, DPT 11:37 AM,11/12/23

## 2023-11-12 NOTE — Assessment & Plan Note (Signed)
 Takes Protonix  and Pepto-Bismol Keeping n.p.o. for now due to nausea

## 2023-11-12 NOTE — Assessment & Plan Note (Signed)
 Hemoglobin near baseline On vitamin B shots We will check anemia panel

## 2023-11-12 NOTE — Hospital Course (Signed)
 [name]

## 2023-11-12 NOTE — Assessment & Plan Note (Signed)
 No acute issues suspected Nonnarcotic pain control

## 2023-11-12 NOTE — Progress Notes (Signed)
 RE: Allen David, Allen David Date of Birth: 1928/02/02 Date: 11/12/2023     To Whom It May Concern:   Please be advised that the above-named patient will require a short-term nursing home stay - anticipated 30 days or less for rehabilitation and strengthening.  The plan is for return home

## 2023-11-12 NOTE — TOC Progression Note (Signed)
 Transition of Care [facility]) - Progression Note    Patient Details  Name: Allen David MRN: [id number] Date of Birth: 1927/10/12  Transition of Care [facility]) CM/SW Contact  Quintella Suzen Jansky, RN Phone Number: 11/12/2023, 3:27 PM  Clinical Narrative:     PASRR requested additional clinical for review. Clinical documents submitted via [facility] portal. Patient pending PASRR number.       Expected Discharge Plan and Services                                               Social Determinants of Health (SDOH) Interventions SDOH Screenings   Food Insecurity: No Food Insecurity (11/12/2023)  Housing: Low Risk  (11/12/2023)  Transportation Needs: No Transportation Needs (11/12/2023)  Utilities: Not At Risk (11/12/2023)  Depression (PHQ2-9): High Risk (08/04/2023)  Financial Resource Strain: Low Risk  (09/29/2023)   Received from [facility]  Social Connections: Socially Isolated (11/12/2023)  Stress: No Stress Concern Present (02/21/2020)  Tobacco Use: Medium Risk (11/11/2023)    Readmission Risk Interventions     No data to display

## 2023-11-13 DIAGNOSIS — G9341 Metabolic encephalopathy: Secondary | ICD-10-CM | POA: Diagnosis not present

## 2023-11-13 NOTE — TOC Progression Note (Signed)
 Transition of Care [facility]) - Progression Note    Patient Details  Name: Allen David MRN: [id number] Date of Birth: [birth year]  [facility]) CM/SW Contact  Marinda Cooks, RN Phone Number: 11/13/2023, 12:42 PM  Clinical Narrative:     CM spoke with pt's (Daughter )Nena @[phone] introduced role and discussed pt's dc plan, she confirmed bed offer 1st choice for [facility] . This CM Spoke with the admision liaison Daril Gander @ [phone]  and confirmed bed offer after pt has 3 day stay due to his traditional Medicare ins. TOC will cont to follow dc planning / care coordination and update as applicable.      Expected Discharge Plan and Services                                               Social Determinants of Health (SDOH) Interventions SDOH Screenings   Food Insecurity: No Food Insecurity (11/12/2023)  Housing: Low Risk  (11/12/2023)  Transportation Needs: No Transportation Needs (11/12/2023)  Utilities: Not At Risk (11/12/2023)  Depression (PHQ2-9): High Risk (08/04/2023)  Financial Resource Strain: Low Risk  (09/29/2023)   Received from [facility]  Social Connections: Socially Isolated (11/12/2023)  Stress: No Stress Concern Present (02/21/2020)  Tobacco Use: Medium Risk (11/11/2023)    Readmission Risk Interventions     No data to display

## 2023-11-13 NOTE — NC FL2 (Addendum)
 [place]  MEDICAID FL2 LEVEL OF CARE FORM     IDENTIFICATION  Patient Name: Allen David Birthdate: 01-17-1928 Sex: male Admission Date (Current Location): 11/11/2023  [place] and [place] Number:  Chiropodist and Address:  [facility], [address] [place], [place] [address]      Provider Number: [id number]  Attending Physician Name and Address:  Marsa Edelman, DO  Relative Name and Phone Number:  Walden Pool (Daughter)  [phone]    Current Level of Care: Hospital Recommended Level of Care: Skilled Nursing Facility Prior Approval Number:    Date Approved/Denied:   PASRR Number:  [id number]   Discharge Plan: SNF    Current Diagnoses: Patient Active Problem List   Diagnosis Date Noted   Acute toxic-metabolic encephalopathy 11/12/2023   Chronic back pain 11/12/2023   IDA (iron deficiency anemia) 11/12/2023   Acute lactic acidosis 11/12/2023   Atrial fibrillation, new onset (HCC) 11/12/2023   Generalized anxiety disorder with panic attacks 11/12/2023   Encephalopathy 11/12/2023   Adjustment disorder with anxiety 11/23/2020   History of hypothyroidism 11/09/2020   GERD (gastroesophageal reflux disease) 11/09/2020   Anemia 11/09/2020   Abnormal findings on diagnostic imaging of other parts of musculoskeletal system 10/31/2020   Acute renal failure (HCC) 10/31/2020   Unspecified abnormal findings in urine 10/31/2020   Onychomycosis 07/26/2020   Iron deficiency 07/26/2020   Medication monitoring encounter 07/26/2020   Nausea and vomiting 04/26/2020   B12 deficiency 04/26/2020   Chronic left shoulder pain 11/16/2019   IBS (irritable bowel syndrome) 07/31/2019   Right leg pain 03/25/2019   Branch retinal vein occlusion 12/13/2018   Muscle cramps 11/03/2018   Difficulty hearing 09/20/2018   Leg pain 09/20/2018   Proteinuria 05/30/2018   Carpal tunnel syndrome 05/30/2018   Fatty liver 05/14/2018   Anxiety 05/14/2018    Aortic atherosclerosis (HCC) 01/05/2018   Neuropathy 12/26/2017   Allergic rhinitis 09/16/2017   Muscular chest pain 08/21/2017   Adrenal adenoma 03/11/2017   Incidental lung nodule, > 3mm and < 8mm 03/11/2017   Right foot pain 11/04/2016   Other specified abdominal hernia without obstruction or gangrene 11/04/2016   Skin lesion 01/01/2015   Arthralgia of both knees 09/04/2014   Pernicious anemia 12/20/2013   Hypertension 06/03/2012   Chronic low back pain 06/03/2012   Anxiety and depression 06/03/2012   Hyperlipidemia 01/20/2012   CAD, NATIVE VESSEL 01/09/2010    Orientation RESPIRATION BLADDER Height & Weight     Self  Normal   Weight: 64.9 kg Height:  5' 10 (177.8 cm)  BEHAVIORAL SYMPTOMS/MOOD NEUROLOGICAL BOWEL NUTRITION STATUS  Other (Comment) (Confused, no behavior difficulty noted)     Diet (Dysphasia One)  AMBULATORY STATUS COMMUNICATION OF NEEDS Skin   Extensive Assist Does not communicate (speech is mumbled, difficult to understand) Other (Comment) (Rash on arms, chest, back and neck)                       Personal Care Assistance Level of Assistance  Bathing, Feeding, Dressing, Total care Bathing Assistance: Maximum assistance Feeding assistance: Limited assistance Dressing Assistance: Maximum assistance Total Care Assistance: Maximum assistance   Functional Limitations Info  Sight, Hearing, Speech Sight Info: Adequate Hearing Info: Impaired Speech Info: Impaired    SPECIAL CARE FACTORS FREQUENCY  PT (By licensed PT), OT (By licensed OT)     PT Frequency: 5x day OT Frequency: 5x day  Contractures Contractures Info: Not present    Additional Factors Info  Code Status, Allergies Code Status Info: DNR Limited Allergies Info: Lexapro  (escitalopram  Oxalate) Not Specified   hypertension  Tetanus Toxoids- swelling           Current Medications (11/13/2023):  This is the current hospital active medication list Current  Facility-Administered Medications  Medication Dose Route Frequency Provider Last Rate Last Admin   acetaminophen  (TYLENOL ) tablet 650 mg  650 mg Oral Q6H PRN Duncan, Hazel V, MD       Or   acetaminophen  (TYLENOL ) suppository 650 mg  650 mg Rectal Q6H PRN Cleatus Delayne GAILS, MD       aspirin  chewable tablet 81 mg  81 mg Oral 1 day or 1 dose Alexander, Natalie, DO       DULoxetine  (CYMBALTA ) DR capsule 60 mg  60 mg Oral Daily Alexander, Natalie, DO   60 mg at 11/13/23 9048   enoxaparin  (LOVENOX ) injection 40 mg  40 mg Subcutaneous Q24H Duncan, Hazel V, MD   40 mg at 11/13/23 0950   haloperidol  lactate (HALDOL ) injection 5 mg  5 mg Intravenous Q6H PRN Alexander, Natalie, DO   5 mg at 11/12/23 1538   multivitamin with minerals tablet 1 tablet  1 tablet Oral Daily Marsa Edelman, DO   1 tablet at 11/13/23 9048   ondansetron  (ZOFRAN ) tablet 4 mg  4 mg Oral Q6H PRN Duncan, Hazel V, MD       Or   ondansetron  (ZOFRAN ) injection 4 mg  4 mg Intravenous Q6H PRN Cleatus Delayne GAILS, MD       pantoprazole  (PROTONIX ) EC tablet 40 mg  40 mg Oral Daily Alexander, Natalie, DO   40 mg at 11/13/23 9048   sodium chloride  flush (NS) 0.9 % injection 3 mL  3 mL Intravenous Q12H Cleatus Delayne GAILS, MD   3 mL at 11/13/23 9047     Discharge Medications: Please see discharge summary for a list of discharge medications.  Relevant Imaging Results:  Relevant Lab Results:   Additional Information SS# [id number]  Marinda Cooks, RN

## 2023-11-13 NOTE — Progress Notes (Signed)
 Speech Language Pathology Treatment: Dysphagia  Patient Details Name: Allen David MRN: [id number] DOB: 1927/12/02 Today's Date: 11/13/2023 Time: 8684-8649 SLP Time Calculation (min) (ACUTE ONLY): 35 min  Assessment / Plan / Recommendation Clinical Impression  Pt seen for ongoing assessment of swallowing. He was much more alert, verbally responsive and engaged in conversation w/ SLP; engaged in taking po trials. Min HOH? Pt is on RA; wbc wnl. Afebrile.  Missing Most Dentition for effective mastication.   Pt explained general aspiration precautions and agreed verbally to the need for following them especially sitting upright for all oral intake -- supported behind the back more for full upright sitting. Also discussed need for Small sips/bites and chewing foods well- especially the more solid foods. Pt agreed stating he didn't have many teeth.  Pt given setup then fed himself trials of soft solids and thin liquids via straw. No overt clinical s/s of aspiration were noted w/ any consistency; respiratory status remained calm and unlabored, vocal quality clear b/t trials, no coughing. Pt's oral phase appeared grossly [facility] for bolus management and timely A-P transfer for swallowing; min increased mashing/gumming w/ the textured foods. Oral clearing achieved w/ all consistencies. NSG denied any deficits in swallowing as well. Noted pt had been eating regular spaghetti in his room- a tray ordered by Family per discussion w/ NSG. Pt consumed 2 trials w/ this SLP and appeared to tolerate it well. Cautioned against foods that were difficult to chew d/t Missing Most Dentition. Pt agreed.   Pt appears at reduced risk for aspriation when following general aspiration precautions and monitoring food textures/cutting foods well. Recommend a mech soft/regular diet for ease of soft, cut foods w/ gravies added to moisten foods; Thin liquids. Recommend general aspiration precautions; Pills Whole in Puree; tray setup and  positioning assistance for meals. REFLUX precautions d/t pt's baseline.  ST services will sign off at this time w/ MD to reconsult if needed while admitted. NSG updated. Precautions posted at bedside, chart. Dietician f/u.       HPI HPI: Pt is a 89 y.o. male with medical history significant for CAD, IBS HTN, anxiety(tried on multiple meds SSRIs/benzos), psoriasis, fall, chronic back pain, iron deficiency anemia, being admitted with abdominal pain, nausea and lethargy of uncertain etiology.  Workup with lactic acidosis but otherwise mostly unrevealing.  Per report, he had nausea and abdominal discomfort 2 to 3 days ago, similar to his GERD, that progressively worsened and then on the day of arrival he became very lethargic.   At baseline, he is able to manage some ADLs. He has no diarrhea, cough or shortness of breath.  Patient unable to contribute to history due to lethargy.  According to records review and daughter at bedside he has been struggling with anxiety since his [place] doctor took him off of Xanax  a year ago.  He had taken it for 30 years.,  At his last PCP visit from 07/2023 he complained of 2-3 anxiety attacks daily.  On 10/16/2023 documented that he self-increased his BuSpar  to 30 mg daily.  Head CT and CXR are Non-acute; Atrophy with small vessel ischemic changes per MD/Imaging.  Pt admitted w/ dx of  Acute toxic-metabolic encephalopathy.      SLP Plan  All goals met          Recommendations  Diet recommendations: Dysphagia 3 (mechanical soft);Thin liquid Liquids provided via: Cup;Straw Medication Administration: Whole meds with puree (vs crushed in puree if needed) Supervision: Patient able to self feed;Staff to  assist with self feeding;Intermittent supervision to cue for compensatory strategies Compensations: Minimize environmental distractions;Slow rate;Small sips/bites;Lingual sweep for clearance of pocketing;Follow solids with liquid Postural Changes and/or Swallow Maneuvers: Out  of bed for meals;Seated upright 90 degrees;Upright 30-60 min after meal                 (Dietician f/u if needed) Oral care BID;Oral care before and after PO;Staff/trained caregiver to provide oral care   Intermittent Supervision/Assistance Dysphagia, unspecified (R13.10) (Missing Dentition)     All goals met      [name], MS, CCC-SLP Speech Language Pathologist Rehab Services; [facility] [phone] (ascom) [name]  11/13/2023, 4:22 PM

## 2023-11-13 NOTE — Progress Notes (Signed)
 PROGRESS NOTE    [name]   FMW:[id number] DOB: 04-23-1928  DOA: 11/11/2023 Date of Service: 11/13/23 which is hospital day 1  PCP: Clinic, [facility] course / significant events:   HPI: Allen David is a 89 y.o. male with medical history significant for CAD, IBS HTN, anxiety(tried on multiple meds SSRIs/benzos), psoriasis, chronic back pain, iron deficiency anemia, being admitted with abdominal pain, nausea and lethargy of uncertain etiology. History provided by daughter and grandson - stated he feels started with nausea and abdominal discomfort x2 to 3 days, similar to his GERD, progressively worsened, became very lethargic. At baseline he is oriented, manages his finances, walks his dogs.   06/25: to ED. Workup with lactic acidosis but otherwise essentially unrevealing. AFib rate 100 converted to NSR w/ fluids.  06/26: admitted to hospitalist service early AM. PT/OT recs for SNF rehab. UDS (+)Cannabinoid. Very confused, somnolent, later agitated 06/27: cognition much improved today. Pending placement for SNF rehab.     Consultants:  none  Procedures/Surgeries: none      ASSESSMENT & PLAN:  Acute toxic-metabolic encephalopathy - improved Labs and imaging unrevealing other than UDS (+)Cannabinoids, pt denies use of THC/CBD/D8   No localizing signs of infection Suspecting possible medication toxicity from antianxiety medications in combination with possible mild dehydration from poor oral intake related to nausea TSH and B12 WNL We will hold anxiety meds until mentation closer to baseline and slowly reintroduce   CT Head: chronic microvascular ischemic disease and loss brain volume, likely clinically significant, suspect possible vascular dementia  CT Head: empty sella, uncertain clinical significance but in light of nausea concern may correlate to intracranial HTN though less likely in absence of HA/VC Monitor mental status --> improved  Nausea  and vomiting - resolved Acute lactic acidosis Sepsis not suspected as not meeting other criteria Suspect lactic acidosis secondary to medication versus GI losses Symptomatic care    Atrial fibrillation, new onset  EKG showed A-fib at 100---> post hydration EKG showed sinus at 63. TSH WNL Continuous cardiac monitoring for now   Generalized anxiety disorder with panic attacks PCP reports history of 2-3 anxiety attacks a day with sweating and shakiness Reportedly has been on multiple different meds for anxiety including benzos, Valium, Xanax  which were all discontinued by the [place] Currently on duloxetine  60, and patient self increased his BuSpar  to 30 mg per PCP documentation on 10/16/2023 Given suspicion for medication related encephalopathy, will hold meds to slowly reintroduce once more awake Consider geripsych eval but pt is improving today  Will avoid benzos   IDA (iron deficiency anemia) Hemoglobin near baseline On vitamin B shots and iron at home  Monitor CBC   Chronic back pain No acute issues suspected Nonnarcotic pain control  IBS (irritable bowel syndrome) Takes Protonix  and Pepto-Bismol Symptomatic care    Hypertension Holding home amlodipine  due to lower blood pressure, to resume if/when appropriate   CAD, NATIVE VESSEL No chest pain, troponin negative.  No ischemic changes on EKG Resume ASA   Rash Question guttate psoriasis? Is not consistent w/ meningitis  Monitor    No concenrs based on BMI: Body mass index is 20.66 kg/m.[name] Significantly low or high BMI is associated with higher medical risk.  Underweight - under 18  overweight - 25 to 29 obese - 30 or more Class 1 obesity: BMI of 30.0 to 34 Class 2 obesity: BMI of 35.0 to 39 Class 3 obesity: BMI of 40.0 to 49 Super  Morbid Obesity: BMI 50-59 Super-super Morbid Obesity: BMI 60+ Healthy nutrition and physical activity advised as adjunct to other disease management and risk reduction treatments    DVT  prophylaxis: lovenox  IV fluids: have dc continuous IV fluids  Nutrition: per SLP Central lines / other devices: none  Code Status: DNR ACP documentation reviewed:  none on file in [facility]  [facility] needs: SNF placement  Medical barriers to dispo: none.              Subjective / Brief ROS:  Patient is more alert today and conversational He can answer where we are but cannot tell me events that brought him to the hospital    Family Communication: will reach out to family later today     Objective Findings:  Vitals:   11/12/23 1130 11/12/23 1937 11/13/23 0604 11/13/23 0759  BP: (!) 126/51 (!) 155/103 (!) 127/54 (!) 116/94  Pulse: 67 95 (!) 52 (!) 50  Resp: 18 18 18 16   Temp: 98 F (36.7 C) 98.1 F (36.7 C) 98.5 F (36.9 C) 98.3 F (36.8 C)  TempSrc:      SpO2: 100% 100% 100% 99%  Weight:   64.9 kg   Height:        Intake/Output Summary (Last 24 hours) at 11/13/2023 1213 Last data filed at 11/12/2023 1740 Gross per 24 hour  Intake 240 ml  Output --  Net 240 ml   Filed Weights   11/11/23 2239 11/12/23 0412 11/13/23 0604  Weight: 72 kg 65.3 kg 64.9 kg    Examination:  Physical Exam Constitutional:      General: He is not in acute distress.  Cardiovascular:     Rate and Rhythm: Normal rate and regular rhythm.  Pulmonary:     Effort: Pulmonary effort is normal.     Breath sounds: Normal breath sounds.  Abdominal:     General: Bowel sounds are normal.     Palpations: Abdomen is soft.     Tenderness: There is no abdominal tenderness.   Skin:    General: Skin is warm and dry.     Findings: Rash (dry patches scattered across skin) present.   Neurological:     General: No focal deficit present.     Mental Status: He is alert and oriented to person, place, and time.   Psychiatric:        Mood and Affect: Mood normal.        Behavior: Behavior normal.          Scheduled Medications:   aspirin   81 mg Oral 1 day or 1 dose   DULoxetine   60 mg Oral  Daily   enoxaparin  (LOVENOX ) injection  40 mg Subcutaneous Q24H   multivitamin with minerals  1 tablet Oral Daily   pantoprazole   40 mg Oral Daily   sodium chloride  flush  3 mL Intravenous Q12H    Continuous Infusions:   PRN Medications:  acetaminophen  **OR** acetaminophen , haloperidol  lactate, ondansetron  **OR** ondansetron  (ZOFRAN ) IV  Antimicrobials from admission:  Anti-infectives (From admission, onward)    None           Data Reviewed:  I have personally reviewed the following...  CBC: Recent Labs  Lab 11/11/23 2243 11/12/23 1654  WBC 6.5 4.6  HGB 10.0* 8.2*  HCT 30.2* 24.9*  MCV 89.9 89.9  PLT 101* 75*   Basic Metabolic Panel: Recent Labs  Lab 11/11/23 2243 11/12/23 1654  NA 133* 136  K 3.9 3.5  CL 102  107  CO2 18* 22  GLUCOSE 123* 91  BUN 23 22  CREATININE 0.99 0.92  CALCIUM  8.4* 7.9*   GFR: Estimated Creatinine Clearance: 45.1 mL/min (by C-G formula based on SCr of 0.92 mg/dL). Liver Function Tests: Recent Labs  Lab 11/12/23 0026  AST 29  ALT 12  ALKPHOS 64  BILITOT 1.3*  PROT 6.3*  ALBUMIN 3.3*   Recent Labs  Lab 11/12/23 0026  LIPASE 37   No results for input(s): AMMONIA in the last 168 hours. Coagulation Profile: No results for input(s): INR, PROTIME in the last 168 hours. Cardiac Enzymes: No results for input(s): CKTOTAL, CKMB, CKMBINDEX, TROPONINI in the last 168 hours. BNP (last 3 results) No results for input(s): PROBNP in the last 8760 hours. HbA1C: No results for input(s): HGBA1C in the last 72 hours. CBG: Recent Labs  Lab 11/12/23 0519  GLUCAP 114*   Lipid Profile: No results for input(s): CHOL, HDL, LDLCALC, TRIG, CHOLHDL, LDLDIRECT in the last 72 hours. Thyroid  Function Tests: Recent Labs    11/12/23 0026  TSH 2.429  FREET4 0.71   Anemia Panel: Recent Labs    11/11/23 2243 11/12/23 0026 11/12/23 0528  VITAMINB12  --   --  660  FOLATE  --  9.7  --   FERRITIN  --   614*  --   TIBC  --  203*  --   IRON  --  21*  --   RETICCTPCT 1.8  --   --    Most Recent Urinalysis On File:     Component Value Date/Time   COLORURINE AMBER (A) 11/11/2023 0026   APPEARANCEUR CLEAR (A) 11/11/2023 0026   LABSPEC 1.017 11/11/2023 0026   PHURINE 6.0 11/11/2023 0026   GLUCOSEU NEGATIVE 11/11/2023 0026   HGBUR SMALL (A) 11/11/2023 0026   BILIRUBINUR NEGATIVE 11/11/2023 0026   BILIRUBINUR small 08/01/2022 1703   KETONESUR NEGATIVE 11/11/2023 0026   PROTEINUR 30 (A) 11/11/2023 0026   UROBILINOGEN 1.0 08/01/2022 1703   NITRITE NEGATIVE 11/11/2023 0026   LEUKOCYTESUR NEGATIVE 11/11/2023 0026   Sepsis Labs: @LABRCNTIP (procalcitonin:4,lacticidven:4) Microbiology: No results found for this or any previous visit (from the past 240 hours).    Radiology Studies last 3 days: ECHOCARDIOGRAM COMPLETE Result Date: 11/12/2023    ECHOCARDIOGRAM REPORT   Patient Name:   MATTIA LIFORD Date of Exam: 11/12/2023 Medical Rec #:  [id number]       Height:       70.0 in Accession #:    [id number]      Weight:       144.0 lb Date of Birth:  1927-11-25        BSA:          1.815 m Patient Age:    89 years        BP:           126/51 mmHg Patient Gender: M               HR:           67 bpm. Exam Location:  [facility] Procedure: 2D Echo, Cardiac Doppler and Color Doppler (Both Spectral and Color            Flow Doppler were utilized during procedure). Indications:     Syncope R55  History:         Patient has no prior history of Echocardiogram examinations.                  CAD;  Risk Factors:Hypertension.  Sonographer:     Christopher Furnace Referring Phys:  [id number] DELAYNE LULLA SOLIAN Diagnosing Phys: Cara JONETTA Lovelace MD IMPRESSIONS  1. Left ventricular ejection fraction, by estimation, is 60 to 65%. The left ventricle has normal function. The left ventricle has no regional wall motion abnormalities. Left ventricular diastolic parameters are consistent with Grade I diastolic dysfunction (impaired relaxation).  2.  Right ventricular systolic function is normal. The right ventricular size is moderately enlarged.  3. The mitral valve is normal in structure. Trivial mitral valve regurgitation.  4. The aortic valve is normal in structure. Aortic valve regurgitation is not visualized. FINDINGS  Left Ventricle: Left ventricular ejection fraction, by estimation, is 60 to 65%. The left ventricle has normal function. The left ventricle has no regional wall motion abnormalities. Strain was performed and the global longitudinal strain is indeterminate. Global longitudinal strain performed but not reported based on interpreter judgement due to suboptimal tracking. The left ventricular internal cavity size was normal in size. There is no left ventricular hypertrophy. Left ventricular diastolic parameters are consistent with Grade I diastolic dysfunction (impaired relaxation). Right Ventricle: The right ventricular size is moderately enlarged. No increase in right ventricular wall thickness. Right ventricular systolic function is normal. Left Atrium: Left atrial size was normal in size. Right Atrium: Right atrial size was normal in size. Pericardium: There is no evidence of pericardial effusion. Mitral Valve: The mitral valve is normal in structure. Trivial mitral valve regurgitation. Tricuspid Valve: The tricuspid valve is normal in structure. Tricuspid valve regurgitation is mild. Aortic Valve: The aortic valve is normal in structure. Aortic valve regurgitation is not visualized. Aortic valve mean gradient measures 1.0 mmHg. Aortic valve peak gradient measures 2.9 mmHg. Aortic valve area, by VTI measures 3.08 cm. Pulmonic Valve: The pulmonic valve was normal in structure. Pulmonic valve regurgitation is not visualized. Aorta: The ascending aorta was not well visualized. IAS/Shunts: No atrial level shunt detected by color flow Doppler. Additional Comments: 3D was performed not requiring image post processing on an independent workstation  and was indeterminate.  LEFT VENTRICLE PLAX 2D LVIDd:         4.30 cm   Diastology LVIDs:         2.80 cm   LV e' medial:    6.85 cm/s LV PW:         1.10 cm   LV E/e' medial:  10.1 LV IVS:        1.10 cm   LV e' lateral:   5.55 cm/s LVOT diam:     2.20 cm   LV E/e' lateral: 12.5 LV SV:         52 LV SV Index:   28 LVOT Area:     3.80 cm  RIGHT VENTRICLE RV Basal diam:  3.40 cm RV Mid diam:    3.10 cm LEFT ATRIUM           Index        RIGHT ATRIUM           Index LA diam:      3.10 cm 1.71 cm/m   RA Area:     14.70 cm LA Vol (A2C): 26.5 ml 14.60 ml/m  RA Volume:   32.00 ml  17.63 ml/m LA Vol (A4C): 22.5 ml 12.40 ml/m  AORTIC VALVE AV Area (Vmax):    3.12 cm AV Area (Vmean):   2.78 cm AV Area (VTI):     3.08 cm AV Vmax:  84.80 cm/s AV Vmean:          54.600 cm/s AV VTI:            0.168 m AV Peak Grad:      2.9 mmHg AV Mean Grad:      1.0 mmHg LVOT Vmax:         69.50 cm/s LVOT Vmean:        39.900 cm/s LVOT VTI:          0.136 m LVOT/AV VTI ratio: 0.81  AORTA Ao Root diam: 3.60 cm MITRAL VALVE                TRICUSPID VALVE MV Area (PHT): 5.88 cm     TR Peak grad:   15.4 mmHg MV Decel Time: 129 msec     TR Vmax:        196.00 cm/s MV E velocity: 69.20 cm/s MV A velocity: 113.00 cm/s  SHUNTS MV E/A ratio:  0.61         Systemic VTI:  0.14 m                             Systemic Diam: 2.20 cm Cara JONETTA Lovelace MD Electronically signed by Cara JONETTA Lovelace MD Signature Date/Time: 11/12/2023/3:53:36 PM    Final    CT ABDOMEN PELVIS W CONTRAST Result Date: 11/12/2023 CLINICAL DATA:  Acute abdominal pain and nausea, initial encounter EXAM: CT ABDOMEN AND PELVIS WITH CONTRAST TECHNIQUE: Multidetector CT imaging of the abdomen and pelvis was performed using the standard protocol following bolus administration of intravenous contrast. RADIATION DOSE REDUCTION: This exam was performed according to the departmental dose-optimization program which includes automated exposure control, adjustment of the mA  and/or kV according to patient size and/or use of iterative reconstruction technique. CONTRAST:  OMNIPAQUE  IOHEXOL  300 MG/ML  SOLN COMPARISON:  11/18/2016 FINDINGS: Lower chest: Subpleural fibrotic changes are noted. No focal infiltrate is seen. Hepatobiliary: No focal liver abnormality is seen. No gallstones, gallbladder wall thickening, or biliary dilatation. Pancreas: Unremarkable. No pancreatic ductal dilatation or surrounding inflammatory changes. Spleen: Normal in size without focal abnormality. Adrenals/Urinary Tract: Adrenal glands show a stable 10 mm nodule on the left unchanged from 2018 consistent with a benign adenoma. Kidneys demonstrate cystic change bilaterally similar to that seen on the prior study. No follow-up is recommended. No renal calculi or obstructive changes are seen. The bladder is partially distended. Stomach/Bowel: Scattered diverticular change of the colon is noted without evidence of diverticulitis. The appendix is not well visualized and may have been surgically removed. No inflammatory changes to suggest appendicitis are noted. Small bowel and stomach are within normal limits. Vascular/Lymphatic: Aortic atherosclerosis. No enlarged abdominal or pelvic lymph nodes. Reproductive: Prostate is unremarkable. Other: No abdominal wall hernia or abnormality. No abdominopelvic ascites. Musculoskeletal: No acute or significant osseous findings. IMPRESSION: Diverticulosis without diverticulitis. Stable left adrenal nodule consistent with adenoma. No acute abnormality to correspond with the given clinical history is noted. Electronically Signed   By: Oneil Devonshire M.D.   On: 11/12/2023 01:51   CT Head Wo Contrast Result Date: 11/12/2023 CLINICAL DATA:  Mental status change, unknown cause EXAM: CT HEAD WITHOUT CONTRAST TECHNIQUE: Contiguous axial images were obtained from the base of the skull through the vertex without intravenous contrast. RADIATION DOSE REDUCTION: This exam was  performed according to the departmental dose-optimization program which includes automated exposure control, adjustment of the mA and/or kV according  to patient size and/or use of iterative reconstruction technique. COMPARISON:  CT head 06/17/2021 FINDINGS: Brain: Cerebral ventricle sizes are concordant with the degree of cerebral volume loss. Trace patchy and confluent areas of decreased attenuation are noted throughout the deep and periventricular white matter of the cerebral hemispheres bilaterally, compatible with chronic microvascular ischemic disease. No evidence of large-territorial acute infarction. No parenchymal hemorrhage. No mass lesion. No extra-axial collection. No mass effect or midline shift. No hydrocephalus. Basilar cisterns are patent. Empty sella. Vascular: No hyperdense vessel. Atherosclerotic calcifications are present within the cavernous internal carotid and vertebral arteries. Skull: No acute fracture or focal lesion. Sinuses/Orbits: Paranasal sinuses and mastoid air cells are clear. The orbits are unremarkable. Other: None. IMPRESSION: 1. No acute intracranial abnormality. 2. Empty sella. Findings is often a normal anatomic variant but can be associated with idiopathic intracranial hypertension (pseudotumor cerebri). Electronically Signed   By: Morgane  Naveau M.D.   On: 11/12/2023 01:42   DG Chest 1 View Result Date: 11/11/2023 CLINICAL DATA:  Abdominal pain, nausea EXAM: CHEST  1 VIEW COMPARISON:  None Available. FINDINGS: Heart and mediastinal contours within normal limits. No confluent airspace opacities, effusions or edema. No acute bony abnormality. IMPRESSION: No active disease. Electronically Signed   By: Franky Crease M.D.   On: 11/11/2023 23:07       Time spent: 35 min     Clova Morlock, DO [facility] 11/13/2023, 12:13 PM    Dictation software may have been used to generate the above note. Typos may occur and escape review in typed/dictated  notes. Please contact Dr Marsa directly for clarity if needed.  Staff may message me via secure chat in Epic  but this may not receive an immediate response,  please page me for urgent matters!  If 7PM-7AM, please contact night coverage [email or web address]

## 2023-11-14 DIAGNOSIS — G9341 Metabolic encephalopathy: Secondary | ICD-10-CM | POA: Diagnosis not present

## 2023-11-14 MED ORDER — BUSPIRONE HCL 10 MG PO TABS
5.0000 mg | ORAL_TABLET | Freq: Three times a day (TID) | ORAL | Status: DC
  Administered 2023-11-14 – 2023-11-15 (×5): 5 mg via ORAL
  Filled 2023-11-14 (×6): qty 1

## 2023-11-14 MED ORDER — TRAZODONE HCL 50 MG PO TABS
50.0000 mg | ORAL_TABLET | Freq: Every evening | ORAL | Status: DC | PRN
  Administered 2023-11-14: 50 mg via ORAL
  Filled 2023-11-14: qty 1

## 2023-11-14 NOTE — Plan of Care (Signed)
  Problem: Education: Goal: Knowledge of condition and prescribed therapy will improve Outcome: Progressing   Problem: Cardiac: Goal: Will achieve and/or maintain adequate cardiac output Outcome: Progressing   Problem: Physical Regulation: Goal: Complications related to the disease process, condition or treatment will be avoided or minimized Outcome: Progressing   Problem: Education: Goal: Knowledge of General Education information will improve Description: Including pain rating scale, medication(s)/side effects and non-pharmacologic comfort measures Outcome: Progressing   Problem: Health Behavior/Discharge Planning: Goal: Ability to manage health-related needs will improve Outcome: Progressing   

## 2023-11-14 NOTE — Care Management Important Message (Signed)
 Important Message  Patient Details  Name: Allen David MRN: [id number] Date of Birth: 1928-01-13   Important Message Given:  Yes - Medicare IM     Geoge Lawrance W, CMA 11/14/2023, 11:39 AM

## 2023-11-14 NOTE — Progress Notes (Addendum)
 PROGRESS NOTE    Allen David   FMW:[id number] DOB: 1927/06/02  DOA: 11/11/2023 Date of Service: 11/14/23 which is hospital day 2  PCP: Clinic, [facility] course / significant events:   HPI: Allen David is a 89 y.o. male with medical history significant for CAD, IBS HTN, anxiety(tried on multiple meds SSRIs/benzos), psoriasis, chronic back pain, iron deficiency anemia, being admitted with abdominal pain, nausea and lethargy of uncertain etiology. History provided by daughter and grandson - stated he feels started with nausea and abdominal discomfort x2 to 3 days, similar to his GERD, progressively worsened, became very lethargic. At baseline he is oriented, manages his finances, walks his dogs.   06/25: to ED. Workup with lactic acidosis but otherwise essentially unrevealing. AFib rate 100 converted to NSR w/ fluids.  06/26: admitted to hospitalist service early AM. PT/OT recs for SNF rehab. UDS (+)Cannabinoid. Very confused, somnolent, later agitated 06/27-06/28: cognition much improved. Pending placement for SNF rehab.     Consultants:  none  Procedures/Surgeries: none      ASSESSMENT & PLAN:  Acute toxic-metabolic encephalopathy - improved Labs and imaging unrevealing other than UDS (+)Cannabinoids, pt denies use of THC/CBD/D8   No localizing signs of infection Suspecting possible medication toxicity from antianxiety medications in combination with possible mild dehydration from poor oral intake related to nausea TSH and B12 WNL We will hold anxiety meds until mentation closer to baseline and slowly reintroduce   CT Head: chronic microvascular ischemic disease and loss brain volume, likely clinically significant, suspect possible vascular dementia  CT Head: empty sella, uncertain clinical significance but in light of nausea concern may correlate to intracranial HTN though less likely in absence of HA/VC Monitor mental status --> improved  Nausea  and vomiting - resolved Acute lactic acidosis Sepsis not suspected as not meeting other criteria Suspect lactic acidosis secondary to medication versus GI losses Symptomatic care    Atrial fibrillation, new onset  EKG showed A-fib at 100---> post hydration EKG showed sinus at 63. TSH WNL Continuous cardiac monitoring for now   Generalized anxiety disorder with panic attacks PCP reports history of 2-3 anxiety attacks a day with sweating and shakiness Reportedly has been on multiple different meds for anxiety including benzos, Valium, Xanax  which were all discontinued by the [place] Currently on duloxetine  60, and patient self increased his BuSpar  to 30 mg per PCP documentation on 10/16/2023 Given suspicion for medication related encephalopathy, will hold meds to slowly reintroduce once more awake Consider geripsych eval but pt is improving today  Will avoid benzos Restarted BuSpar  at lower dose today 5 mg tid    IDA (iron deficiency anemia) Hemoglobin near baseline On vitamin B shots and iron at home  Monitor CBC   Chronic back pain No acute issues suspected Nonnarcotic pain control  IBS (irritable bowel syndrome) Takes Protonix  and Pepto-Bismol Symptomatic care    Hypertension Holding home amlodipine  due to lower blood pressure, to resume if/when appropriate   CAD, NATIVE VESSEL No chest pain, troponin negative.  No ischemic changes on EKG Resume ASA   Rash Question guttate psoriasis? Is not consistent w/ meningitis  Monitor    No concenrs based on BMI: Body mass index is 20.66 kg/m.[name] Significantly low or high BMI is associated with higher medical risk.  Underweight - under 18  overweight - 25 to 29 obese - 30 or more Class 1 obesity: BMI of 30.0 to 34 Class 2 obesity: BMI of 35.0 to 39  Class 3 obesity: BMI of 40.0 to 49 Super Morbid Obesity: BMI 50-59 Super-super Morbid Obesity: BMI 60+ Healthy nutrition and physical activity advised as adjunct to other disease  management and risk reduction treatments    DVT prophylaxis: lovenox  IV fluids: have dc continuous IV fluids  Nutrition: per SLP Central lines / other devices: none  Code Status: DNR ACP documentation reviewed:  none on file in [facility]  [facility] needs: SNF placement  Medical barriers to dispo: none.              Subjective / Brief ROS:  Patient is alert today and conversational He reports he is feeling very anxious today but slept okay last night    Family Communication: spoke w/ daughter on the phone 11/14/23 3:46 PM     Objective Findings:  Vitals:   11/13/23 2146 11/13/23 2147 11/14/23 0428 11/14/23 0829  BP: 103/60 (!) 95/56 108/66 (!) 113/55  Pulse: 83 83 69 62  Resp:    20  Temp:   98.2 F (36.8 C) 97.7 F (36.5 C)  TempSrc:      SpO2:   98% 100%  Weight:      Height:        Intake/Output Summary (Last 24 hours) at 11/14/2023 1147 Last data filed at 11/14/2023 0428 Gross per 24 hour  Intake --  Output 500 ml  Net -500 ml   Filed Weights   11/11/23 2239 11/12/23 0412 11/13/23 0604  Weight: 72 kg 65.3 kg 64.9 kg    Examination:  Physical Exam Constitutional:      General: He is not in acute distress.  Cardiovascular:     Rate and Rhythm: Normal rate and regular rhythm.  Pulmonary:     Effort: Pulmonary effort is normal.     Breath sounds: Normal breath sounds.  Abdominal:     General: Bowel sounds are normal.     Palpations: Abdomen is soft.     Tenderness: There is no abdominal tenderness.   Skin:    General: Skin is warm and dry.     Findings: Rash (dry patches scattered across skin) present.   Neurological:     General: No focal deficit present.     Mental Status: He is alert and oriented to person, place, and time.   Psychiatric:        Mood and Affect: Mood normal.        Behavior: Behavior normal.          Scheduled Medications:   aspirin   81 mg Oral 1 day or 1 dose   busPIRone   5 mg Oral TID   DULoxetine   60 mg  Oral Daily   enoxaparin  (LOVENOX ) injection  40 mg Subcutaneous Q24H   multivitamin with minerals  1 tablet Oral Daily   pantoprazole   40 mg Oral Daily   sodium chloride  flush  3 mL Intravenous Q12H    Continuous Infusions:   PRN Medications:  acetaminophen  **OR** acetaminophen , haloperidol  lactate, ondansetron  **OR** ondansetron  (ZOFRAN ) IV  Antimicrobials from admission:  Anti-infectives (From admission, onward)    None           Data Reviewed:  I have personally reviewed the following...  CBC: Recent Labs  Lab 11/11/23 2243 11/12/23 1654  WBC 6.5 4.6  HGB 10.0* 8.2*  HCT 30.2* 24.9*  MCV 89.9 89.9  PLT 101* 75*   Basic Metabolic Panel: Recent Labs  Lab 11/11/23 2243 11/12/23 1654  NA 133* 136  K 3.9 3.5  CL 102 107  CO2 18* 22  GLUCOSE 123* 91  BUN 23 22  CREATININE 0.99 0.92  CALCIUM  8.4* 7.9*   GFR: Estimated Creatinine Clearance: 45.1 mL/min (by C-G formula based on SCr of 0.92 mg/dL). Liver Function Tests: Recent Labs  Lab 11/12/23 0026  AST 29  ALT 12  ALKPHOS 64  BILITOT 1.3*  PROT 6.3*  ALBUMIN 3.3*   Recent Labs  Lab 11/12/23 0026  LIPASE 37   No results for input(s): AMMONIA in the last 168 hours. Coagulation Profile: No results for input(s): INR, PROTIME in the last 168 hours. Cardiac Enzymes: No results for input(s): CKTOTAL, CKMB, CKMBINDEX, TROPONINI in the last 168 hours. BNP (last 3 results) No results for input(s): PROBNP in the last 8760 hours. HbA1C: No results for input(s): HGBA1C in the last 72 hours. CBG: Recent Labs  Lab 11/12/23 0519  GLUCAP 114*   Lipid Profile: No results for input(s): CHOL, HDL, LDLCALC, TRIG, CHOLHDL, LDLDIRECT in the last 72 hours. Thyroid  Function Tests: Recent Labs    11/12/23 0026  TSH 2.429  FREET4 0.71   Anemia Panel: Recent Labs    11/11/23 2243 11/12/23 0026 11/12/23 0528  VITAMINB12  --   --  660  FOLATE  --  9.7  --   FERRITIN   --  614*  --   TIBC  --  203*  --   IRON  --  21*  --   RETICCTPCT 1.8  --   --    Most Recent Urinalysis On File:     Component Value Date/Time   COLORURINE AMBER (A) 11/11/2023 0026   APPEARANCEUR CLEAR (A) 11/11/2023 0026   LABSPEC 1.017 11/11/2023 0026   PHURINE 6.0 11/11/2023 0026   GLUCOSEU NEGATIVE 11/11/2023 0026   HGBUR SMALL (A) 11/11/2023 0026   BILIRUBINUR NEGATIVE 11/11/2023 0026   BILIRUBINUR small 08/01/2022 1703   KETONESUR NEGATIVE 11/11/2023 0026   PROTEINUR 30 (A) 11/11/2023 0026   UROBILINOGEN 1.0 08/01/2022 1703   NITRITE NEGATIVE 11/11/2023 0026   LEUKOCYTESUR NEGATIVE 11/11/2023 0026   Sepsis Labs: @LABRCNTIP (procalcitonin:4,lacticidven:4) Microbiology: No results found for this or any previous visit (from the past 240 hours).    Radiology Studies last 3 days: ECHOCARDIOGRAM COMPLETE Result Date: 11/12/2023    ECHOCARDIOGRAM REPORT   Patient Name:   KAESON KLEINERT Date of Exam: 11/12/2023 Medical Rec #:  [id number]       Height:       70.0 in Accession #:    [id number]      Weight:       144.0 lb Date of Birth:  1927-11-29        BSA:          1.815 m Patient Age:    89 years        BP:           126/51 mmHg Patient Gender: M               HR:           67 bpm. Exam Location:  [facility] Procedure: 2D Echo, Cardiac Doppler and Color Doppler (Both Spectral and Color            Flow Doppler were utilized during procedure). Indications:     Syncope R55  History:         Patient has no prior history of Echocardiogram examinations.  CAD; Risk Factors:Hypertension.  Sonographer:     Christopher Furnace Referring Phys:  [id number] DELAYNE LULLA SOLIAN Diagnosing Phys: Cara JONETTA Lovelace MD IMPRESSIONS  1. Left ventricular ejection fraction, by estimation, is 60 to 65%. The left ventricle has normal function. The left ventricle has no regional wall motion abnormalities. Left ventricular diastolic parameters are consistent with Grade I diastolic dysfunction (impaired relaxation).  2.  Right ventricular systolic function is normal. The right ventricular size is moderately enlarged.  3. The mitral valve is normal in structure. Trivial mitral valve regurgitation.  4. The aortic valve is normal in structure. Aortic valve regurgitation is not visualized. FINDINGS  Left Ventricle: Left ventricular ejection fraction, by estimation, is 60 to 65%. The left ventricle has normal function. The left ventricle has no regional wall motion abnormalities. Strain was performed and the global longitudinal strain is indeterminate. Global longitudinal strain performed but not reported based on interpreter judgement due to suboptimal tracking. The left ventricular internal cavity size was normal in size. There is no left ventricular hypertrophy. Left ventricular diastolic parameters are consistent with Grade I diastolic dysfunction (impaired relaxation). Right Ventricle: The right ventricular size is moderately enlarged. No increase in right ventricular wall thickness. Right ventricular systolic function is normal. Left Atrium: Left atrial size was normal in size. Right Atrium: Right atrial size was normal in size. Pericardium: There is no evidence of pericardial effusion. Mitral Valve: The mitral valve is normal in structure. Trivial mitral valve regurgitation. Tricuspid Valve: The tricuspid valve is normal in structure. Tricuspid valve regurgitation is mild. Aortic Valve: The aortic valve is normal in structure. Aortic valve regurgitation is not visualized. Aortic valve mean gradient measures 1.0 mmHg. Aortic valve peak gradient measures 2.9 mmHg. Aortic valve area, by VTI measures 3.08 cm. Pulmonic Valve: The pulmonic valve was normal in structure. Pulmonic valve regurgitation is not visualized. Aorta: The ascending aorta was not well visualized. IAS/Shunts: No atrial level shunt detected by color flow Doppler. Additional Comments: 3D was performed not requiring image post processing on an independent workstation  and was indeterminate.  LEFT VENTRICLE PLAX 2D LVIDd:         4.30 cm   Diastology LVIDs:         2.80 cm   LV e' medial:    6.85 cm/s LV PW:         1.10 cm   LV E/e' medial:  10.1 LV IVS:        1.10 cm   LV e' lateral:   5.55 cm/s LVOT diam:     2.20 cm   LV E/e' lateral: 12.5 LV SV:         52 LV SV Index:   28 LVOT Area:     3.80 cm  RIGHT VENTRICLE RV Basal diam:  3.40 cm RV Mid diam:    3.10 cm LEFT ATRIUM           Index        RIGHT ATRIUM           Index LA diam:      3.10 cm 1.71 cm/m   RA Area:     14.70 cm LA Vol (A2C): 26.5 ml 14.60 ml/m  RA Volume:   32.00 ml  17.63 ml/m LA Vol (A4C): 22.5 ml 12.40 ml/m  AORTIC VALVE AV Area (Vmax):    3.12 cm AV Area (Vmean):   2.78 cm AV Area (VTI):     3.08 cm AV Vmax:  84.80 cm/s AV Vmean:          54.600 cm/s AV VTI:            0.168 m AV Peak Grad:      2.9 mmHg AV Mean Grad:      1.0 mmHg LVOT Vmax:         69.50 cm/s LVOT Vmean:        39.900 cm/s LVOT VTI:          0.136 m LVOT/AV VTI ratio: 0.81  AORTA Ao Root diam: 3.60 cm MITRAL VALVE                TRICUSPID VALVE MV Area (PHT): 5.88 cm     TR Peak grad:   15.4 mmHg MV Decel Time: 129 msec     TR Vmax:        196.00 cm/s MV E velocity: 69.20 cm/s MV A velocity: 113.00 cm/s  SHUNTS MV E/A ratio:  0.61         Systemic VTI:  0.14 m                             Systemic Diam: 2.20 cm Cara JONETTA Lovelace MD Electronically signed by Cara JONETTA Lovelace MD Signature Date/Time: 11/12/2023/3:53:36 PM    Final    CT ABDOMEN PELVIS W CONTRAST Result Date: 11/12/2023 CLINICAL DATA:  Acute abdominal pain and nausea, initial encounter EXAM: CT ABDOMEN AND PELVIS WITH CONTRAST TECHNIQUE: Multidetector CT imaging of the abdomen and pelvis was performed using the standard protocol following bolus administration of intravenous contrast. RADIATION DOSE REDUCTION: This exam was performed according to the departmental dose-optimization program which includes automated exposure control, adjustment of the mA  and/or kV according to patient size and/or use of iterative reconstruction technique. CONTRAST:  OMNIPAQUE  IOHEXOL  300 MG/ML  SOLN COMPARISON:  11/18/2016 FINDINGS: Lower chest: Subpleural fibrotic changes are noted. No focal infiltrate is seen. Hepatobiliary: No focal liver abnormality is seen. No gallstones, gallbladder wall thickening, or biliary dilatation. Pancreas: Unremarkable. No pancreatic ductal dilatation or surrounding inflammatory changes. Spleen: Normal in size without focal abnormality. Adrenals/Urinary Tract: Adrenal glands show a stable 10 mm nodule on the left unchanged from 2018 consistent with a benign adenoma. Kidneys demonstrate cystic change bilaterally similar to that seen on the prior study. No follow-up is recommended. No renal calculi or obstructive changes are seen. The bladder is partially distended. Stomach/Bowel: Scattered diverticular change of the colon is noted without evidence of diverticulitis. The appendix is not well visualized and may have been surgically removed. No inflammatory changes to suggest appendicitis are noted. Small bowel and stomach are within normal limits. Vascular/Lymphatic: Aortic atherosclerosis. No enlarged abdominal or pelvic lymph nodes. Reproductive: Prostate is unremarkable. Other: No abdominal wall hernia or abnormality. No abdominopelvic ascites. Musculoskeletal: No acute or significant osseous findings. IMPRESSION: Diverticulosis without diverticulitis. Stable left adrenal nodule consistent with adenoma. No acute abnormality to correspond with the given clinical history is noted. Electronically Signed   By: Oneil Devonshire M.D.   On: 11/12/2023 01:51   CT Head Wo Contrast Result Date: 11/12/2023 CLINICAL DATA:  Mental status change, unknown cause EXAM: CT HEAD WITHOUT CONTRAST TECHNIQUE: Contiguous axial images were obtained from the base of the skull through the vertex without intravenous contrast. RADIATION DOSE REDUCTION: This exam was  performed according to the departmental dose-optimization program which includes automated exposure control, adjustment of the mA and/or kV according  to patient size and/or use of iterative reconstruction technique. COMPARISON:  CT head 06/17/2021 FINDINGS: Brain: Cerebral ventricle sizes are concordant with the degree of cerebral volume loss. Trace patchy and confluent areas of decreased attenuation are noted throughout the deep and periventricular white matter of the cerebral hemispheres bilaterally, compatible with chronic microvascular ischemic disease. No evidence of large-territorial acute infarction. No parenchymal hemorrhage. No mass lesion. No extra-axial collection. No mass effect or midline shift. No hydrocephalus. Basilar cisterns are patent. Empty sella. Vascular: No hyperdense vessel. Atherosclerotic calcifications are present within the cavernous internal carotid and vertebral arteries. Skull: No acute fracture or focal lesion. Sinuses/Orbits: Paranasal sinuses and mastoid air cells are clear. The orbits are unremarkable. Other: None. IMPRESSION: 1. No acute intracranial abnormality. 2. Empty sella. Findings is often a normal anatomic variant but can be associated with idiopathic intracranial hypertension (pseudotumor cerebri). Electronically Signed   By: Morgane  Naveau M.D.   On: 11/12/2023 01:42   DG Chest 1 View Result Date: 11/11/2023 CLINICAL DATA:  Abdominal pain, nausea EXAM: CHEST  1 VIEW COMPARISON:  None Available. FINDINGS: Heart and mediastinal contours within normal limits. No confluent airspace opacities, effusions or edema. No acute bony abnormality. IMPRESSION: No active disease. Electronically Signed   By: Franky Crease M.D.   On: 11/11/2023 23:07       Time spent: 35 min     Shahira Fiske, DO [facility] 11/14/2023, 11:47 AM    Dictation software may have been used to generate the above note. Typos may occur and escape review in typed/dictated  notes. Please contact Dr Marsa directly for clarity if needed.  Staff may message me via secure chat in Epic  but this may not receive an immediate response,  please page me for urgent matters!  If 7PM-7AM, please contact night coverage [email or web address]

## 2023-11-15 DIAGNOSIS — G9341 Metabolic encephalopathy: Secondary | ICD-10-CM | POA: Diagnosis not present

## 2023-11-15 MED ORDER — POLYETHYLENE GLYCOL 3350 17 G PO PACK
17.0000 g | PACK | Freq: Every day | ORAL | Status: DC
  Administered 2023-11-15 – 2023-11-19 (×4): 17 g via ORAL
  Filled 2023-11-15 (×4): qty 1

## 2023-11-15 MED ORDER — QUETIAPINE FUMARATE 25 MG PO TABS
50.0000 mg | ORAL_TABLET | Freq: Two times a day (BID) | ORAL | Status: DC
  Administered 2023-11-15: 50 mg via ORAL
  Filled 2023-11-15 (×2): qty 2

## 2023-11-15 MED ORDER — ACETAMINOPHEN 325 MG PO TABS
650.0000 mg | ORAL_TABLET | Freq: Four times a day (QID) | ORAL | Status: DC
  Administered 2023-11-15 – 2023-11-19 (×14): 650 mg via ORAL
  Filled 2023-11-15 (×14): qty 2

## 2023-11-15 NOTE — Plan of Care (Signed)
  Problem: Education: Goal: Knowledge of condition and prescribed therapy will improve Outcome: Progressing   Problem: Cardiac: Goal: Will achieve and/or maintain adequate cardiac output Outcome: Progressing   Problem: Physical Regulation: Goal: Complications related to the disease process, condition or treatment will be avoided or minimized Outcome: Progressing   Problem: Health Behavior/Discharge Planning: Goal: Ability to manage health-related needs will improve Outcome: Progressing   Problem: Clinical Measurements: Goal: Ability to maintain clinical measurements within normal limits will improve Outcome: Progressing Goal: Will remain free from infection Outcome: Progressing Goal: Diagnostic test results will improve Outcome: Progressing Goal: Respiratory complications will improve Outcome: Progressing Goal: Cardiovascular complication will be avoided Outcome: Progressing   Problem: Activity: Goal: Risk for activity intolerance will decrease Outcome: Progressing   Problem: Nutrition: Goal: Adequate nutrition will be maintained Outcome: Progressing   Problem: Coping: Goal: Level of anxiety will decrease Outcome: Progressing   Problem: Elimination: Goal: Will not experience complications related to urinary retention Outcome: Progressing   Problem: Pain Managment: Goal: General experience of comfort will improve and/or be controlled Outcome: Progressing   Problem: Safety: Goal: Ability to remain free from injury will improve Outcome: Progressing   Problem: Skin Integrity: Goal: Risk for impaired skin integrity will decrease Outcome: Progressing   Problem: Activity: Goal: Ability to tolerate increased activity will improve Outcome: Progressing

## 2023-11-15 NOTE — Progress Notes (Signed)
 PROGRESS NOTE    Allen David   FMW:[id number] DOB: 1927-11-29  DOA: 11/11/2023 Date of Service: 11/15/23 which is hospital day 3  PCP: Clinic, [facility] course / significant events:   HPI: Allen David is a 89 y.o. male with medical history significant for CAD, IBS HTN, anxiety(tried on multiple meds SSRIs/benzos), psoriasis, chronic back pain, iron deficiency anemia, being admitted with abdominal pain, nausea and lethargy of uncertain etiology. History provided by daughter and grandson - stated he feels started with nausea and abdominal discomfort x2 to 3 days, similar to his GERD, progressively worsened, became very lethargic. At baseline he is oriented, manages his finances, walks his dogs.   06/25: to ED. Workup with lactic acidosis but otherwise essentially unrevealing. AFib rate 100 converted to NSR w/ fluids.  06/26: admitted to hospitalist service early AM. PT/OT recs for SNF rehab. UDS (+)Cannabinoid. Very confused, somnolent, later agitated 06/27-06/28: cognition much improved. Pending placement for SNF rehab.     Consultants:  none  Procedures/Surgeries: none      ASSESSMENT & PLAN:  Acute toxic-metabolic encephalopathy - improved Labs and imaging unrevealing other than UDS (+)Cannabinoids, pt denies use of THC/CBD/D8   No localizing signs of infection Suspecting possible medication toxicity from antianxiety medications in combination with possible mild dehydration from poor oral intake related to nausea TSH and B12 WNL We will hold anxiety meds until mentation closer to baseline and slowly reintroduce   CT Head: chronic microvascular ischemic disease and loss brain volume, likely clinically significant, suspect possible vascular dementia  CT Head: empty sella, uncertain clinical significance but in light of nausea concern may correlate to intracranial HTN though less likely in absence of HA/VC Monitor mental status --> improved  Nausea  and vomiting - resolved Acute lactic acidosis Sepsis not suspected as not meeting other criteria Suspect lactic acidosis secondary to medication versus GI losses Symptomatic care    Atrial fibrillation, new onset  EKG showed A-fib at 100---> post hydration EKG showed sinus at 63. TSH WNL Continuous cardiac monitoring for now   Generalized anxiety disorder with panic attacks PCP reports history of 2-3 anxiety attacks a day with sweating and shakiness Reportedly has been on multiple different meds for anxiety including benzos, Valium, Xanax  which were all discontinued by the [place] Currently on duloxetine  60, and patient self increased his BuSpar  to 30 mg per PCP documentation on 10/16/2023 Given suspicion for medication related encephalopathy, held meds to slowly reintroduce / titrate  Consider geripsych eval but pt is improving  Avoid benzos Restarted BuSpar  at lower dose 5 mg tid  Started Seroquel 50 mg bid to help anxiety / mood    IDA (iron deficiency anemia) Hemoglobin near baseline On vitamin B shots and iron at home  Monitor CBC   Chronic back pain No acute issues suspected Nonnarcotic pain control - acetaminophen    IBS (irritable bowel syndrome) Takes Protonix  and Pepto-Bismol Symptomatic care    Hypertension Holding home amlodipine  due to lower blood pressure, to resume if/when appropriate   CAD, NATIVE VESSEL No chest pain, troponin negative.  No ischemic changes on EKG Resume ASA   Rash Question guttate psoriasis? Is not consistent w/ meningitis  Monitor    No concenrs based on BMI: Body mass index is 20.66 kg/m.[name] Significantly low or high BMI is associated with higher medical risk.  Underweight - under 18  overweight - 25 to 29 obese - 30 or more Class 1 obesity: BMI of 30.0  to 34 Class 2 obesity: BMI of 35.0 to 39 Class 3 obesity: BMI of 40.0 to 49 Super Morbid Obesity: BMI 50-59 Super-super Morbid Obesity: BMI 60+ Healthy nutrition and physical activity  advised as adjunct to other disease management and risk reduction treatments    DVT prophylaxis: lovenox  IV fluids: have dc continuous IV fluids  Nutrition: per SLP Central lines / other devices: none  Code Status: DNR ACP documentation reviewed:  none on file in [facility]  [facility] needs: SNF placement  Medical barriers to dispo: none.              Subjective / Brief ROS:  Patient is alert today and conversational, about same as yesterday, very pleasant. Reports the sleep medication last night (trazodone 50 mg) didn't help    Family Communication: spoke w/ daughter on the phone yesterday, CM working on placement, no medical updates today will call as needed      Objective Findings:  Vitals:   11/15/23 0028 11/15/23 0404 11/15/23 0500 11/15/23 0800  BP: 121/88 120/84  (!) 141/75  Pulse: 70 71  71  Resp:    18  Temp: 98.8 F (37.1 C) 98.4 F (36.9 C)  97.8 F (36.6 C)  TempSrc:    Oral  SpO2: 100% 96%  97%  Weight:   68.4 kg   Height:       No intake or output data in the 24 hours ending 11/15/23 1139  Filed Weights   11/12/23 0412 11/13/23 0604 11/15/23 0500  Weight: 65.3 kg 64.9 kg 68.4 kg    Examination:  Physical Exam Constitutional:      General: He is not in acute distress.  Cardiovascular:     Rate and Rhythm: Normal rate and regular rhythm.  Pulmonary:     Effort: Pulmonary effort is normal.     Breath sounds: Normal breath sounds.  Abdominal:     General: Bowel sounds are normal.     Palpations: Abdomen is soft.     Tenderness: There is no abdominal tenderness.   Skin:    General: Skin is warm and dry.     Findings: Rash (dry patches scattered across skin) present.   Neurological:     General: No focal deficit present.     Mental Status: He is alert and oriented to person, place, and time.   Psychiatric:        Mood and Affect: Mood normal.        Behavior: Behavior normal.          Scheduled Medications:   acetaminophen   650  mg Oral Q6H   aspirin   81 mg Oral 1 day or 1 dose   busPIRone   5 mg Oral TID   DULoxetine   60 mg Oral Daily   enoxaparin  (LOVENOX ) injection  40 mg Subcutaneous Q24H   multivitamin with minerals  1 tablet Oral Daily   pantoprazole   40 mg Oral Daily   polyethylene glycol  17 g Oral Daily   QUEtiapine  50 mg Oral BID   sodium chloride  flush  3 mL Intravenous Q12H    Continuous Infusions:   PRN Medications:  haloperidol  lactate, ondansetron  **OR** ondansetron  (ZOFRAN ) IV  Antimicrobials from admission:  Anti-infectives (From admission, onward)    None           Data Reviewed:  I have personally reviewed the following...  CBC: Recent Labs  Lab 11/11/23 2243 11/12/23 1654  WBC 6.5 4.6  HGB 10.0* 8.2*  HCT  30.2* 24.9*  MCV 89.9 89.9  PLT 101* 75*   Basic Metabolic Panel: Recent Labs  Lab 11/11/23 2243 11/12/23 1654  NA 133* 136  K 3.9 3.5  CL 102 107  CO2 18* 22  GLUCOSE 123* 91  BUN 23 22  CREATININE 0.99 0.92  CALCIUM  8.4* 7.9*   GFR: Estimated Creatinine Clearance: 47.5 mL/min (by C-G formula based on SCr of 0.92 mg/dL). Liver Function Tests: Recent Labs  Lab 11/12/23 0026  AST 29  ALT 12  ALKPHOS 64  BILITOT 1.3*  PROT 6.3*  ALBUMIN 3.3*   Recent Labs  Lab 11/12/23 0026  LIPASE 37   No results for input(s): AMMONIA in the last 168 hours. Coagulation Profile: No results for input(s): INR, PROTIME in the last 168 hours. Cardiac Enzymes: No results for input(s): CKTOTAL, CKMB, CKMBINDEX, TROPONINI in the last 168 hours. BNP (last 3 results) No results for input(s): PROBNP in the last 8760 hours. HbA1C: No results for input(s): HGBA1C in the last 72 hours. CBG: Recent Labs  Lab 11/12/23 0519  GLUCAP 114*   Lipid Profile: No results for input(s): CHOL, HDL, LDLCALC, TRIG, CHOLHDL, LDLDIRECT in the last 72 hours. Thyroid  Function Tests: No results for input(s): TSH, T4TOTAL, FREET4, T3FREE,  THYROIDAB in the last 72 hours.  Anemia Panel: No results for input(s): VITAMINB12, FOLATE, FERRITIN, TIBC, IRON, RETICCTPCT in the last 72 hours.  Most Recent Urinalysis On File:     Component Value Date/Time   COLORURINE AMBER (A) 11/11/2023 0026   APPEARANCEUR CLEAR (A) 11/11/2023 0026   LABSPEC 1.017 11/11/2023 0026   PHURINE 6.0 11/11/2023 0026   GLUCOSEU NEGATIVE 11/11/2023 0026   HGBUR SMALL (A) 11/11/2023 0026   BILIRUBINUR NEGATIVE 11/11/2023 0026   BILIRUBINUR small 08/01/2022 1703   KETONESUR NEGATIVE 11/11/2023 0026   PROTEINUR 30 (A) 11/11/2023 0026   UROBILINOGEN 1.0 08/01/2022 1703   NITRITE NEGATIVE 11/11/2023 0026   LEUKOCYTESUR NEGATIVE 11/11/2023 0026   Sepsis Labs: @LABRCNTIP (procalcitonin:4,lacticidven:4) Microbiology: No results found for this or any previous visit (from the past 240 hours).    Radiology Studies last 3 days: ECHOCARDIOGRAM COMPLETE Result Date: 11/12/2023    ECHOCARDIOGRAM REPORT   Patient Name:   Allen David Date of Exam: 11/12/2023 Medical Rec #:  [id number]       Height:       70.0 in Accession #:    [id number]      Weight:       144.0 lb Date of Birth:  03/19/1928        BSA:          1.815 m Patient Age:    89 years        BP:           126/51 mmHg Patient Gender: M               HR:           67 bpm. Exam Location:  [facility] Procedure: 2D Echo, Cardiac Doppler and Color Doppler (Both Spectral and Color            Flow Doppler were utilized during procedure). Indications:     Syncope R55  History:         Patient has no prior history of Echocardiogram examinations.                  CAD; Risk Factors:Hypertension.  Sonographer:     Christopher Furnace Referring Phys:  [id number] DELAYNE LULLA SOLIAN  Diagnosing Phys: Dwayne D Callwood MD IMPRESSIONS  1. Left ventricular ejection fraction, by estimation, is 60 to 65%. The left ventricle has normal function. The left ventricle has no regional wall motion abnormalities. Left ventricular diastolic  parameters are consistent with Grade I diastolic dysfunction (impaired relaxation).  2. Right ventricular systolic function is normal. The right ventricular size is moderately enlarged.  3. The mitral valve is normal in structure. Trivial mitral valve regurgitation.  4. The aortic valve is normal in structure. Aortic valve regurgitation is not visualized. FINDINGS  Left Ventricle: Left ventricular ejection fraction, by estimation, is 60 to 65%. The left ventricle has normal function. The left ventricle has no regional wall motion abnormalities. Strain was performed and the global longitudinal strain is indeterminate. Global longitudinal strain performed but not reported based on interpreter judgement due to suboptimal tracking. The left ventricular internal cavity size was normal in size. There is no left ventricular hypertrophy. Left ventricular diastolic parameters are consistent with Grade I diastolic dysfunction (impaired relaxation). Right Ventricle: The right ventricular size is moderately enlarged. No increase in right ventricular wall thickness. Right ventricular systolic function is normal. Left Atrium: Left atrial size was normal in size. Right Atrium: Right atrial size was normal in size. Pericardium: There is no evidence of pericardial effusion. Mitral Valve: The mitral valve is normal in structure. Trivial mitral valve regurgitation. Tricuspid Valve: The tricuspid valve is normal in structure. Tricuspid valve regurgitation is mild. Aortic Valve: The aortic valve is normal in structure. Aortic valve regurgitation is not visualized. Aortic valve mean gradient measures 1.0 mmHg. Aortic valve peak gradient measures 2.9 mmHg. Aortic valve area, by VTI measures 3.08 cm. Pulmonic Valve: The pulmonic valve was normal in structure. Pulmonic valve regurgitation is not visualized. Aorta: The ascending aorta was not well visualized. IAS/Shunts: No atrial level shunt detected by color flow Doppler. Additional  Comments: 3D was performed not requiring image post processing on an independent workstation and was indeterminate.  LEFT VENTRICLE PLAX 2D LVIDd:         4.30 cm   Diastology LVIDs:         2.80 cm   LV e' medial:    6.85 cm/s LV PW:         1.10 cm   LV E/e' medial:  10.1 LV IVS:        1.10 cm   LV e' lateral:   5.55 cm/s LVOT diam:     2.20 cm   LV E/e' lateral: 12.5 LV SV:         52 LV SV Index:   28 LVOT Area:     3.80 cm  RIGHT VENTRICLE RV Basal diam:  3.40 cm RV Mid diam:    3.10 cm LEFT ATRIUM           Index        RIGHT ATRIUM           Index LA diam:      3.10 cm 1.71 cm/m   RA Area:     14.70 cm LA Vol (A2C): 26.5 ml 14.60 ml/m  RA Volume:   32.00 ml  17.63 ml/m LA Vol (A4C): 22.5 ml 12.40 ml/m  AORTIC VALVE AV Area (Vmax):    3.12 cm AV Area (Vmean):   2.78 cm AV Area (VTI):     3.08 cm AV Vmax:           84.80 cm/s AV Vmean:  54.600 cm/s AV VTI:            0.168 m AV Peak Grad:      2.9 mmHg AV Mean Grad:      1.0 mmHg LVOT Vmax:         69.50 cm/s LVOT Vmean:        39.900 cm/s LVOT VTI:          0.136 m LVOT/AV VTI ratio: 0.81  AORTA Ao Root diam: 3.60 cm MITRAL VALVE                TRICUSPID VALVE MV Area (PHT): 5.88 cm     TR Peak grad:   15.4 mmHg MV Decel Time: 129 msec     TR Vmax:        196.00 cm/s MV E velocity: 69.20 cm/s MV A velocity: 113.00 cm/s  SHUNTS MV E/A ratio:  0.61         Systemic VTI:  0.14 m                             Systemic Diam: 2.20 cm Cara JONETTA Lovelace MD Electronically signed by Cara JONETTA Lovelace MD Signature Date/Time: 11/12/2023/3:53:36 PM    Final    CT ABDOMEN PELVIS W CONTRAST Result Date: 11/12/2023 CLINICAL DATA:  Acute abdominal pain and nausea, initial encounter EXAM: CT ABDOMEN AND PELVIS WITH CONTRAST TECHNIQUE: Multidetector CT imaging of the abdomen and pelvis was performed using the standard protocol following bolus administration of intravenous contrast. RADIATION DOSE REDUCTION: This exam was performed according to the departmental  dose-optimization program which includes automated exposure control, adjustment of the mA and/or kV according to patient size and/or use of iterative reconstruction technique. CONTRAST:  OMNIPAQUE  IOHEXOL  300 MG/ML  SOLN COMPARISON:  11/18/2016 FINDINGS: Lower chest: Subpleural fibrotic changes are noted. No focal infiltrate is seen. Hepatobiliary: No focal liver abnormality is seen. No gallstones, gallbladder wall thickening, or biliary dilatation. Pancreas: Unremarkable. No pancreatic ductal dilatation or surrounding inflammatory changes. Spleen: Normal in size without focal abnormality. Adrenals/Urinary Tract: Adrenal glands show a stable 10 mm nodule on the left unchanged from 2018 consistent with a benign adenoma. Kidneys demonstrate cystic change bilaterally similar to that seen on the prior study. No follow-up is recommended. No renal calculi or obstructive changes are seen. The bladder is partially distended. Stomach/Bowel: Scattered diverticular change of the colon is noted without evidence of diverticulitis. The appendix is not well visualized and may have been surgically removed. No inflammatory changes to suggest appendicitis are noted. Small bowel and stomach are within normal limits. Vascular/Lymphatic: Aortic atherosclerosis. No enlarged abdominal or pelvic lymph nodes. Reproductive: Prostate is unremarkable. Other: No abdominal wall hernia or abnormality. No abdominopelvic ascites. Musculoskeletal: No acute or significant osseous findings. IMPRESSION: Diverticulosis without diverticulitis. Stable left adrenal nodule consistent with adenoma. No acute abnormality to correspond with the given clinical history is noted. Electronically Signed   By: Oneil Devonshire M.D.   On: 11/12/2023 01:51   CT Head Wo Contrast Result Date: 11/12/2023 CLINICAL DATA:  Mental status change, unknown cause EXAM: CT HEAD WITHOUT CONTRAST TECHNIQUE: Contiguous axial images were obtained from the base of the skull  through the vertex without intravenous contrast. RADIATION DOSE REDUCTION: This exam was performed according to the departmental dose-optimization program which includes automated exposure control, adjustment of the mA and/or kV according to patient size and/or use of iterative reconstruction technique. COMPARISON:  CT head  06/17/2021 FINDINGS: Brain: Cerebral ventricle sizes are concordant with the degree of cerebral volume loss. Trace patchy and confluent areas of decreased attenuation are noted throughout the deep and periventricular white matter of the cerebral hemispheres bilaterally, compatible with chronic microvascular ischemic disease. No evidence of large-territorial acute infarction. No parenchymal hemorrhage. No mass lesion. No extra-axial collection. No mass effect or midline shift. No hydrocephalus. Basilar cisterns are patent. Empty sella. Vascular: No hyperdense vessel. Atherosclerotic calcifications are present within the cavernous internal carotid and vertebral arteries. Skull: No acute fracture or focal lesion. Sinuses/Orbits: Paranasal sinuses and mastoid air cells are clear. The orbits are unremarkable. Other: None. IMPRESSION: 1. No acute intracranial abnormality. 2. Empty sella. Findings is often a normal anatomic variant but can be associated with idiopathic intracranial hypertension (pseudotumor cerebri). Electronically Signed   By: Morgane  Naveau M.D.   On: 11/12/2023 01:42   DG Chest 1 View Result Date: 11/11/2023 CLINICAL DATA:  Abdominal pain, nausea EXAM: CHEST  1 VIEW COMPARISON:  None Available. FINDINGS: Heart and mediastinal contours within normal limits. No confluent airspace opacities, effusions or edema. No acute bony abnormality. IMPRESSION: No active disease. Electronically Signed   By: Franky Crease M.D.   On: 11/11/2023 23:07       Time spent: 35 min     Laneta Blunt, DO [facility] 11/15/2023, 11:40 AM    Dictation software may have been used to  generate the above note. Typos may occur and escape review in typed/dictated notes. Please contact Dr Blunt directly for clarity if needed.  Staff may message me via secure chat in Epic  but this may not receive an immediate response,  please page me for urgent matters!  If 7PM-7AM, please contact night coverage [email or web address]

## 2023-11-15 NOTE — Plan of Care (Signed)
  Problem: Education: Goal: Knowledge of condition and prescribed therapy will improve Outcome: Progressing   Problem: Cardiac: Goal: Will achieve and/or maintain adequate cardiac output Outcome: Progressing   Problem: Physical Regulation: Goal: Complications related to the disease process, condition or treatment will be avoided or minimized Outcome: Progressing   Problem: Education: Goal: Knowledge of General Education information will improve Description: Including pain rating scale, medication(s)/side effects and non-pharmacologic comfort measures Outcome: Progressing   Problem: Health Behavior/Discharge Planning: Goal: Ability to manage health-related needs will improve Outcome: Progressing   

## 2023-11-16 ENCOUNTER — Inpatient Hospital Stay

## 2023-11-16 DIAGNOSIS — G9341 Metabolic encephalopathy: Secondary | ICD-10-CM | POA: Diagnosis not present

## 2023-11-16 LAB — CBC
HCT: 22.6 % — ABNORMAL LOW (ref 39.0–52.0)
Hemoglobin: 7.6 g/dL — ABNORMAL LOW (ref 13.0–17.0)
MCH: 29.7 pg (ref 26.0–34.0)
MCHC: 33.6 g/dL (ref 30.0–36.0)
MCV: 88.3 fL (ref 80.0–100.0)
Platelets: 87 10*3/uL — ABNORMAL LOW (ref 150–400)
RBC: 2.56 MIL/uL — ABNORMAL LOW (ref 4.22–5.81)
RDW: 15.5 % (ref 11.5–15.5)
WBC: 11.8 10*3/uL — ABNORMAL HIGH (ref 4.0–10.5)
nRBC: 0 % (ref 0.0–0.2)

## 2023-11-16 LAB — BASIC METABOLIC PANEL WITH GFR
Anion gap: 10 (ref 5–15)
BUN: 24 mg/dL — ABNORMAL HIGH (ref 8–23)
CO2: 20 mmol/L — ABNORMAL LOW (ref 22–32)
Calcium: 7.6 mg/dL — ABNORMAL LOW (ref 8.9–10.3)
Chloride: 103 mmol/L (ref 98–111)
Creatinine, Ser: 1.58 mg/dL — ABNORMAL HIGH (ref 0.61–1.24)
GFR, Estimated: 40 mL/min — ABNORMAL LOW (ref >60)
Glucose, Bld: 98 mg/dL (ref 70–99)
Potassium: 3.6 mmol/L (ref 3.5–5.1)
Sodium: 133 mmol/L — ABNORMAL LOW (ref 135–145)

## 2023-11-16 LAB — URINALYSIS, W/ REFLEX TO CULTURE (INFECTION SUSPECTED)
Bilirubin Urine: NEGATIVE
Glucose, UA: NEGATIVE mg/dL
Hgb urine dipstick: NEGATIVE
Ketones, ur: NEGATIVE mg/dL
Leukocytes,Ua: NEGATIVE
Nitrite: NEGATIVE
Protein, ur: 100 mg/dL — AB
Specific Gravity, Urine: 1.023 (ref 1.005–1.030)
WBC, UA: 0 WBC/hpf (ref 0–5)
pH: 8 (ref 5.0–8.0)

## 2023-11-16 MED ORDER — DEXTROSE-SODIUM CHLORIDE 5-0.9 % IV SOLN: INTRAVENOUS | Status: AC

## 2023-11-16 MED ORDER — IRON SUCROSE 200 MG IVPB - SIMPLE MED
200.0000 mg | Freq: Once | Status: AC
  Administered 2023-11-16: 200 mg via INTRAVENOUS
  Filled 2023-11-16: qty 200

## 2023-11-16 NOTE — Progress Notes (Signed)
 Occupational Therapy Treatment Patient Details Name: Allen David MRN: [id number] DOB: 05-06-1928 Today's Date: 11/16/2023   History of present illness Pt is a 89 yo male that presented to ED for abdominal pain, nausea, lethargy. Workup for acute toxic metabolic encephalopathy, noted for new onset afib as well. PMH of  CAD, IBS HTN, anxiety(tried on multiple meds SSRIs/benzos), psoriasis, chronic back pain, iron deficiency anemia   OT comments  Pt seen for OT treatment on this date. Upon arrival to room multiple family members at bedside. Attempted to wake pt with multi modal sensory stimulation via sternal rubs and cool wash cloth in attempt to arouse pt. Total A to wash pt's face and brush hair at bed level. No noted increased alertness with position changes and ADL grooming tasks. Family reports pt's decreased level of arousal is correlated to medication. Family educated on pressure injury prevention while pt is in this state of arousal. Pt making poor progress toward goals, will continue to follow POC. Discharge recommendation remains appropriate.        If plan is discharge home, recommend the following:  A lot of help with walking and/or transfers;A lot of help with bathing/dressing/bathroom;Direct supervision/assist for medications management;Direct supervision/assist for financial management;Assist for transportation;Supervision due to cognitive status   Equipment Recommendations  Other (comment)    Recommendations for Other Services      Precautions / Restrictions Precautions Precautions: Fall Recall of Precautions/Restrictions: Impaired Restrictions Weight Bearing Restrictions Per Provider Order: No       Mobility Bed Mobility Overal bed mobility: Needs Assistance Bed Mobility: Rolling Rolling: Total assist         General bed mobility comments: Rolled pt either side to adjust positioning to aid in pressure relief    Transfers                   General  transfer comment: Not safe to attempt due to level of arousal     Balance                                           ADL either performed or assessed with clinical judgement   ADL Overall ADL's : Needs assistance/impaired     Grooming: Wash/dry face;Bed level;Total assistance;Wash/dry hands;Brushing hair                                 General ADL Comments: No ADL participation on this date due to level of arousal throughout session.    Communication Communication Communication: Impaired Factors Affecting Communication: Difficulty expressing self (Level of arousal)   Cognition Arousal: Obtunded, Suspect due to medications                 OT - Cognition Comments: Pt unable to open                 Following commands: Impaired Following commands impaired:  (Unable to arousal)      Cueing   Cueing Techniques: Verbal cues, Gestural cues, Tactile cues  Exercises Exercises: Other exercises Other Exercises Other Exercises: Edu family: Role of OT, pressure relief needs, proper body mechniques for boosting pt.    Shoulder Instructions       General Comments      Pertinent Vitals/ Pain       Pain Assessment Pain Assessment:  Faces Pain Score: 0-No pain  Home Living                                          Prior Functioning/Environment              Frequency  Min 2X/week        Progress Toward Goals  OT Goals(current goals can now be found in the care plan section)  Progress towards OT goals: Not progressing toward goals - comment (Pt very lethargic compared to previous session.)  Acute Rehab OT Goals OT Goal Formulation: With patient/family Time For Goal Achievement: 11/26/23 Potential to Achieve Goals: Good ADL Goals Pt Will Perform Grooming: standing;with supervision Pt Will Perform Lower Body Dressing: with supervision;sit to/from stand Pt Will Transfer to Toilet: with  supervision;ambulating Pt Will Perform Toileting - Clothing Manipulation and hygiene: with supervision;sit to/from stand  Plan      Co-evaluation                 AM-PAC OT 6 Clicks Daily Activity     Outcome Measure   Help from another person eating meals?: Total Help from another person taking care of personal grooming?: Total Help from another person toileting, which includes using toliet, bedpan, or urinal?: Total Help from another person bathing (including washing, rinsing, drying)?: Total Help from another person to put on and taking off regular upper body clothing?: Total Help from another person to put on and taking off regular lower body clothing?: Total 6 Click Score: 6    End of Session    OT Visit Diagnosis: Unsteadiness on feet (R26.81);Other abnormalities of gait and mobility (R26.89);Other symptoms and signs involving cognitive function   Activity Tolerance Patient limited by lethargy   Patient Left in bed;with call bell/phone within reach;with bed alarm set;with family/visitor present   Nurse Communication Other (comment) (Current health status)        Time: 1417-1430 OT Time Calculation (min): 13 min  Charges: OT General Charges $OT Visit: 1 Visit OT Treatments $Self Care/Home Management : 8-22 mins  Larraine Colas M.S. OTR/L  11/16/23, 3:18 PM

## 2023-11-16 NOTE — Progress Notes (Addendum)
 PROGRESS NOTE    Allen David   FMW:[id number] DOB: [birth year]  DOA: 11/11/2023 Date of Service: 11/16/23 which is hospital day 4  PCP: Clinic, [facility] course / significant events:   HPI: Allen David is a 89 y.o. male with medical history significant for CAD, IBS HTN, anxiety(tried on multiple meds SSRIs/benzos), psoriasis, chronic back pain, iron deficiency anemia, being admitted with abdominal pain, nausea and lethargy of uncertain etiology. History provided by daughter and grandson - stated he feels started with nausea and abdominal discomfort x2 to 3 days, similar to his GERD, progressively worsened, became very lethargic. At baseline he is oriented, manages his finances, walks his dogs.   06/25: to ED. Workup with lactic acidosis but otherwise essentially unrevealing. AFib rate 100 converted to NSR w/ fluids.  06/26: admitted to hospitalist service early AM. PT/OT recs for SNF rehab. UDS (+)Cannabinoid. Very confused, somnolent, later agitated 06/27-06/29: cognition much improved. Pending placement for SNF rehab.  06/30: more confused/somnolent today, not eating. Increased WBC, AKI, pend infectious w/u     Consultants:  Palliative Care   Procedures/Surgeries: none      ASSESSMENT & PLAN:  Acute toxic-metabolic encephalopathy - improved but then recurrence  Labs and imaging unrevealing other than UDS (+)Cannabinoids, pt denies use of THC/CBD/D8   No localizing signs of infection Suspecting possible medication toxicity from antianxiety medications in combination with possible mild dehydration from poor oral intake related to nausea TSH and B12 WNL We will hold anxiety meds until mentation closer to baseline and slowly reintroduce --> reintroduced w/ los dose seoquel suspect this might be why he's more somnolent today holding all po meds and see if improved into tomorrow  Suspect vascular dementia, given his frailty I do not think he has been able  to care for himself well at home / not meeting calorie needs   AKI  Hyponatremia  New 11/16/23  Low po intake likely dehydrated  IV fluids  Monitor BMP  Leukocytosis without SIRS/Sepsis New 11/16/23  Infectious w/u IV fluids  BCx, CXR, UA  CT Head: chronic microvascular ischemic disease and loss brain volume, likely clinically significant, suspect possible vascular dementia  CT Head: empty sella, uncertain clinical significance but in light of nausea concern may correlate to intracranial HTN though less likely in absence of HA/VC Monitor mental status  D/w neurology and possible consult   Nausea and vomiting - resolved Acute lactic acidosis Sepsis not suspected as not meeting other criteria Suspect lactic acidosis secondary to medication versus GI losses Symptomatic care    Paroxysmal atrial fibrillation, new onset  EKG on admission showed A-fib at 100---> post hydration EKG showed sinus at 63. TSH WNL Echo no concerns  monitor   Generalized anxiety disorder with panic attacks PCP reports history of 2-3 anxiety attacks a day with sweating and shakiness Reportedly has been on multiple different meds for anxiety including benzos, Valium, Xanax  which were all discontinued by the [place] Currently on duloxetine  60, and patient self increased his BuSpar  to 30 mg per PCP documentation on 10/16/2023 Given suspicion for medication related encephalopathy, held meds to slowly reintroduce / titrate  Consider geripsych eval but pt was improving now worse again possible d/t Seroquel last night but dose was fairly low  Avoid benzos Restarted BuSpar  at lower dose 5 mg tid --> holding  Started Seroquel 50 mg bid to help anxiety / mood --> somnolent, will hold this and other meds    Goals of  care: Daughter at bedside today We discussed my suspicion of medication effect likely complicated by vascular dementia, and given his level of frailty I suspect that he has not been taking care of himself  very well at home.  Holding meds for now but if he is not becoming more alert this is likely vascular dementia advancing  Planning family meeting tomorrow w/ pt's two daughters  Palliative consult   IDA (iron deficiency anemia) Hemoglobin near baseline On vitamin B shots and iron at home  Monitor CBC Iron infusion    Chronic back pain No acute issues suspected Nonnarcotic pain control - acetaminophen    IBS (irritable bowel syndrome) Takes Protonix  and Pepto-Bismol Symptomatic care    Hypertension Holding home amlodipine  due to lower blood pressure, to resume if/when appropriate   CAD, NATIVE VESSEL No chest pain, troponin negative.  No ischemic changes on EKG Resume ASA   Rash Question guttate psoriasis? Is not consistent w/ meningitis  Monitor    No concenrs based on BMI: Body mass index is 20.66 kg/m.[name] Significantly low or high BMI is associated with higher medical risk.  Underweight - under 18  overweight - 25 to 29 obese - 30 or more Class 1 obesity: BMI of 30.0 to 34 Class 2 obesity: BMI of 35.0 to 39 Class 3 obesity: BMI of 40.0 to 49 Super Morbid Obesity: BMI 50-59 Super-super Morbid Obesity: BMI 60+ Healthy nutrition and physical activity advised as adjunct to other disease management and risk reduction treatments    DVT prophylaxis: lovenox  IV fluids: have dc continuous IV fluids  Nutrition: per SLP Central lines / other devices: none  Code Status: DNR ACP documentation reviewed:  none on file in [facility]  [facility] needs: SNF placement  Medical barriers to dispo: none.              Subjective / Brief ROS:  Pt unable to contirbute   Family Communication: daughter is at bedside also spoke to other daughter Nena over the phone 11/16/23 5:51 PM      Objective Findings:  Vitals:   11/15/23 2117 11/16/23 0428 11/16/23 0447 11/16/23 0808  BP: 117/68  98/62 (!) 86/49  Pulse: 66  76 87  Resp: 20  18 20   Temp: 97.9 F (36.6 C)  98.4 F  (36.9 C) 98.1 F (36.7 C)  TempSrc: Oral  Axillary   SpO2: 96%  97% 95%  Weight:  69.2 kg    Height:        Intake/Output Summary (Last 24 hours) at 11/16/2023 1553 Last data filed at 11/15/2023 2204 Gross per 24 hour  Intake 0 ml  Output 600 ml  Net -600 ml    Filed Weights   11/13/23 0604 11/15/23 0500 11/16/23 0428  Weight: 64.9 kg 68.4 kg 69.2 kg    Examination:  Physical Exam Constitutional:      General: He is not in acute distress.    Appearance: He is ill-appearing.   Cardiovascular:     Rate and Rhythm: Normal rate and regular rhythm.  Pulmonary:     Effort: Pulmonary effort is normal.     Breath sounds: Normal breath sounds.  Abdominal:     General: Bowel sounds are normal.     Palpations: Abdomen is soft.     Tenderness: There is no abdominal tenderness.   Skin:    General: Skin is warm and dry.     Findings: Rash (dry patches scattered across skin) present.   Neurological:  Comments: Unable to fully assess, he is not following commands          Scheduled Medications:   acetaminophen   650 mg Oral Q6H   enoxaparin  (LOVENOX ) injection  40 mg Subcutaneous Q24H   polyethylene glycol  17 g Oral Daily   sodium chloride  flush  3 mL Intravenous Q12H    Continuous Infusions:  dextrose 5 % and 0.9 % NaCl 125 mL/hr at 11/16/23 0938    PRN Medications:  haloperidol  lactate, ondansetron  **OR** ondansetron  (ZOFRAN ) IV  Antimicrobials from admission:  Anti-infectives (From admission, onward)    None           Data Reviewed:  I have personally reviewed the following...  CBC: Recent Labs  Lab 11/11/23 2243 11/12/23 1654 11/16/23 1354  WBC 6.5 4.6 11.8*  HGB 10.0* 8.2* 7.6*  HCT 30.2* 24.9* 22.6*  MCV 89.9 89.9 88.3  PLT 101* 75* 87*   Basic Metabolic Panel: Recent Labs  Lab 11/11/23 2243 11/12/23 1654 11/16/23 1354  NA 133* 136 133*  K 3.9 3.5 3.6  CL 102 107 103  CO2 18* 22 20*  GLUCOSE 123* 91 98  BUN 23 22 24*   CREATININE 0.99 0.92 1.58*  CALCIUM  8.4* 7.9* 7.6*   GFR: Estimated Creatinine Clearance: 28 mL/min (A) (by C-G formula based on SCr of 1.58 mg/dL (H)). Liver Function Tests: Recent Labs  Lab 11/12/23 0026  AST 29  ALT 12  ALKPHOS 64  BILITOT 1.3*  PROT 6.3*  ALBUMIN 3.3*   Recent Labs  Lab 11/12/23 0026  LIPASE 37   No results for input(s): AMMONIA in the last 168 hours. Coagulation Profile: No results for input(s): INR, PROTIME in the last 168 hours. Cardiac Enzymes: No results for input(s): CKTOTAL, CKMB, CKMBINDEX, TROPONINI in the last 168 hours. BNP (last 3 results) No results for input(s): PROBNP in the last 8760 hours. HbA1C: No results for input(s): HGBA1C in the last 72 hours. CBG: Recent Labs  Lab 11/12/23 0519  GLUCAP 114*   Lipid Profile: No results for input(s): CHOL, HDL, LDLCALC, TRIG, CHOLHDL, LDLDIRECT in the last 72 hours. Thyroid  Function Tests: No results for input(s): TSH, T4TOTAL, FREET4, T3FREE, THYROIDAB in the last 72 hours.  Anemia Panel: No results for input(s): VITAMINB12, FOLATE, FERRITIN, TIBC, IRON, RETICCTPCT in the last 72 hours.  Most Recent Urinalysis On File:     Component Value Date/Time   COLORURINE AMBER (A) 11/11/2023 0026   APPEARANCEUR CLEAR (A) 11/11/2023 0026   LABSPEC 1.017 11/11/2023 0026   PHURINE 6.0 11/11/2023 0026   GLUCOSEU NEGATIVE 11/11/2023 0026   HGBUR SMALL (A) 11/11/2023 0026   BILIRUBINUR NEGATIVE 11/11/2023 0026   BILIRUBINUR small 08/01/2022 1703   KETONESUR NEGATIVE 11/11/2023 0026   PROTEINUR 30 (A) 11/11/2023 0026   UROBILINOGEN 1.0 08/01/2022 1703   NITRITE NEGATIVE 11/11/2023 0026   LEUKOCYTESUR NEGATIVE 11/11/2023 0026   Sepsis Labs: @LABRCNTIP (procalcitonin:4,lacticidven:4) Microbiology: No results found for this or any previous visit (from the past 240 hours).    Radiology Studies last 3 days: No results found.      Time  spent: 35 min     Shaneya Taketa, DO [facility] 11/16/2023, 3:53 PM    Dictation software may have been used to generate the above note. Typos may occur and escape review in typed/dictated notes. Please contact Dr Marsa directly for clarity if needed.  Staff may message me via secure chat in Epic  but this may not receive an immediate response,  please page me for urgent matters!  If 7PM-7AM, please contact night coverage [email or web address]

## 2023-11-16 NOTE — Plan of Care (Signed)
  Problem: Cardiac: Goal: Will achieve and/or maintain adequate cardiac output Outcome: Progressing   Problem: Health Behavior/Discharge Planning: Goal: Ability to manage health-related needs will improve Outcome: Progressing   Problem: Clinical Measurements: Goal: Respiratory complications will improve Outcome: Progressing   Problem: Skin Integrity: Goal: Risk for impaired skin integrity will decrease Outcome: Progressing   Problem: Cardiac: Goal: Ability to achieve and maintain adequate cardiopulmonary perfusion will improve Outcome: Progressing

## 2023-11-16 NOTE — Progress Notes (Signed)
 Physical Therapy Treatment Patient Details Name: Allen David MRN: [id number] DOB: 21-Sep-1927 Today's Date: 11/16/2023   History of Present Illness Pt is a 89 yo male that presented to ED for abdominal pain, nausea, lethargy. Workup for acute toxic metabolic encephalopathy, noted for new onset afib as well. PMH of  CAD, IBS HTN, anxiety(tried on multiple meds SSRIs/benzos), psoriasis, chronic back pain, iron deficiency anemia    PT Comments  Multi modal sensory stimulation provided to increase arousal. No increased alertness with position changes and sensory stimulation. Patient spontaneously moving in bed but unable to follow commands or sustain eye opening for more than a few seconds. Suspect decreased level of arousal is partially due to medication per daughter report. PT will continue to follow to maximize independence and decrease caregiver burden.    If plan is discharge home, recommend the following: A lot of help with walking and/or transfers;A lot of help with bathing/dressing/bathroom;Assistance with feeding;Assist for transportation;Assistance with cooking/housework;Help with stairs or ramp for entrance;Supervision due to cognitive status   Can travel by private vehicle     No  Equipment Recommendations   (TBD)    Recommendations for Other Services       Precautions / Restrictions Precautions Precautions: Fall Recall of Precautions/Restrictions: Impaired Restrictions Weight Bearing Restrictions Per Provider Order: No     Mobility  Bed Mobility Overal bed mobility: Needs Assistance Bed Mobility: Supine to Sit, Sit to Supine     Supine to sit: Total assist Sit to supine: Total assist   General bed mobility comments: attempted to sit patient upright to increase alertness for participation. total assistance required with no active effort provided by the patient for partial sitting    Transfers                        Ambulation/Gait                    Stairs             Wheelchair Mobility     Tilt Bed    Modified Rankin (Stroke Patients Only)       Balance Overall balance assessment: Needs assistance Sitting-balance support: Feet unsupported, No upper extremity supported Sitting balance-Leahy Scale: Zero Sitting balance - Comments: no protective righting reactions with sitting Postural control:  (loss of balance in all directions)                                  Communication Communication Communication: Impaired Factors Affecting Communication: Difficulty expressing self (due to level of alertness)  Cognition Arousal: Obtunded, Suspect due to medications     PT - Cognitive impairments: Difficult to assess Difficult to assess due to: Level of arousal                     PT - Cognition Comments: patient wakes briefly with multi modal sensoary stimulation but quickly falls asleep and unable to participate with mobility efforts this session Following commands: Impaired Following commands impaired:  (unable to follow commands)    Cueing Cueing Techniques: Verbal cues, Gestural cues, Tactile cues  Exercises      General Comments        Pertinent Vitals/Pain Pain Assessment Pain Assessment: PAINAD Breathing: normal Negative Vocalization: none Facial Expression: smiling or inexpressive Body Language: relaxed Consolability: no need to console PAINAD Score: 0    Home  Living                          Prior Function            PT Goals (current goals can now be found in the care plan section) Acute Rehab PT Goals PT Goal Formulation: With family Time For Goal Achievement: 11/26/23 Potential to Achieve Goals: Good Progress towards PT goals: Not progressing toward goals - comment    Frequency    Min 2X/week      PT Plan      Co-evaluation              AM-PAC PT 6 Clicks Mobility   Outcome Measure  Help needed turning from your back to your  side while in a flat bed without using bedrails?: Total Help needed moving from lying on your back to sitting on the side of a flat bed without using bedrails?: Total Help needed moving to and from a bed to a chair (including a wheelchair)?: Total Help needed standing up from a chair using your arms (e.g., wheelchair or bedside chair)?: Total Help needed to walk in hospital room?: Total Help needed climbing 3-5 steps with a railing? : Total 6 Click Score: 6    End of Session   Activity Tolerance: Patient limited by fatigue (level of alertness) Patient left: in bed;with call bell/phone within reach;with bed alarm set;with family/visitor present Nurse Communication: Mobility status PT Visit Diagnosis: Other abnormalities of gait and mobility (R26.89);Difficulty in walking, not elsewhere classified (R26.2);Muscle weakness (generalized) (M62.81)     Time: 9161-9142 PT Time Calculation (min) (ACUTE ONLY): 19 min  Charges:    $Therapeutic Activity: 8-22 mins PT General Charges $$ ACUTE PT VISIT: 1 Visit                     Randine Essex, PT, MPT    Randine LULLA Essex 11/16/2023, 10:23 AM

## 2023-11-16 NOTE — TOC Progression Note (Signed)
 Transition of Care [facility]) - Progression Note    Patient Details  Name: Allen David MRN: [id number] Date of Birth: 02-02-1928  Transition of Care [facility]) CM/SW Contact  Dalia GORMAN Fuse, RN Phone Number: 11/16/2023, 1:42 PM  Clinical Narrative:    The patient has a bed at [facility]. The patient is not medically ready for discharge at this time. A family meeting is scheduled for tomorrow.         Expected Discharge Plan and Services                                               Social Determinants of Health (SDOH) Interventions SDOH Screenings   Food Insecurity: No Food Insecurity (11/12/2023)  Housing: Low Risk  (11/12/2023)  Transportation Needs: No Transportation Needs (11/12/2023)  Utilities: Not At Risk (11/12/2023)  Depression (PHQ2-9): High Risk (08/04/2023)  Financial Resource Strain: Low Risk  (09/29/2023)   Received from [facility]  Social Connections: Socially Isolated (11/12/2023)  Stress: No Stress Concern Present (02/21/2020)  Tobacco Use: Medium Risk (11/11/2023)    Readmission Risk Interventions     No data to display

## 2023-11-16 NOTE — Plan of Care (Signed)
  Problem: Education: Goal: Knowledge of condition and prescribed therapy will improve Outcome: Progressing   Problem: Cardiac: Goal: Will achieve and/or maintain adequate cardiac output Outcome: Progressing   Problem: Physical Regulation: Goal: Complications related to the disease process, condition or treatment will be avoided or minimized Outcome: Progressing   Problem: Education: Goal: Knowledge of General Education information will improve Description: Including pain rating scale, medication(s)/side effects and non-pharmacologic comfort measures Outcome: Progressing   Problem: Health Behavior/Discharge Planning: Goal: Ability to manage health-related needs will improve Outcome: Progressing   Problem: Clinical Measurements: Goal: Ability to maintain clinical measurements within normal limits will improve Outcome: Progressing Goal: Will remain free from infection Outcome: Progressing Goal: Diagnostic test results will improve Outcome: Progressing Goal: Respiratory complications will improve Outcome: Progressing Goal: Cardiovascular complication will be avoided Outcome: Progressing   Problem: Activity: Goal: Risk for activity intolerance will decrease Outcome: Progressing   Problem: Nutrition: Goal: Adequate nutrition will be maintained Outcome: Progressing   Problem: Coping: Goal: Level of anxiety will decrease Outcome: Progressing   Problem: Elimination: Goal: Will not experience complications related to bowel motility Outcome: Progressing Goal: Will not experience complications related to urinary retention Outcome: Progressing   Problem: Pain Managment: Goal: General experience of comfort will improve and/or be controlled Outcome: Progressing   Problem: Safety: Goal: Ability to remain free from injury will improve Outcome: Progressing   Problem: Skin Integrity: Goal: Risk for impaired skin integrity will decrease Outcome: Progressing   Problem:  Education: Goal: Knowledge of disease or condition will improve Outcome: Progressing Goal: Understanding of medication regimen will improve Outcome: Progressing Goal: Individualized Educational Video(s) Outcome: Progressing   Problem: Activity: Goal: Ability to tolerate increased activity will improve Outcome: Progressing   Problem: Cardiac: Goal: Ability to achieve and maintain adequate cardiopulmonary perfusion will improve Outcome: Progressing   Problem: Health Behavior/Discharge Planning: Goal: Ability to safely manage health-related needs after discharge will improve Outcome: Progressing

## 2023-11-17 ENCOUNTER — Inpatient Hospital Stay

## 2023-11-17 ENCOUNTER — Other Ambulatory Visit: Payer: Self-pay

## 2023-11-17 DIAGNOSIS — I440 Atrioventricular block, first degree: Secondary | ICD-10-CM

## 2023-11-17 DIAGNOSIS — G9341 Metabolic encephalopathy: Secondary | ICD-10-CM | POA: Diagnosis not present

## 2023-11-17 DIAGNOSIS — I4581 Long QT syndrome: Secondary | ICD-10-CM

## 2023-11-17 LAB — BASIC METABOLIC PANEL WITH GFR
Anion gap: 6 (ref 5–15)
BUN: 23 mg/dL (ref 8–23)
CO2: 25 mmol/L (ref 22–32)
Calcium: 8.2 mg/dL — ABNORMAL LOW (ref 8.9–10.3)
Chloride: 107 mmol/L (ref 98–111)
Creatinine, Ser: 1.21 mg/dL (ref 0.61–1.24)
GFR, Estimated: 55 mL/min — ABNORMAL LOW (ref >60)
Glucose, Bld: 86 mg/dL (ref 70–99)
Potassium: 4.5 mmol/L (ref 3.5–5.1)
Sodium: 138 mmol/L (ref 135–145)

## 2023-11-17 LAB — CBC
HCT: 26.7 % — ABNORMAL LOW (ref 39.0–52.0)
Hemoglobin: 8.8 g/dL — ABNORMAL LOW (ref 13.0–17.0)
MCH: 28.9 pg (ref 26.0–34.0)
MCHC: 33 g/dL (ref 30.0–36.0)
MCV: 87.8 fL (ref 80.0–100.0)
Platelets: 101 10*3/uL — ABNORMAL LOW (ref 150–400)
RBC: 3.04 MIL/uL — ABNORMAL LOW (ref 4.22–5.81)
RDW: 15.8 % — ABNORMAL HIGH (ref 11.5–15.5)
WBC: 6.5 10*3/uL (ref 4.0–10.5)
nRBC: 0 % (ref 0.0–0.2)

## 2023-11-17 NOTE — Progress Notes (Signed)
 Physical Therapy Treatment Patient Details Name: Allen David MRN: [id number] DOB: 1927/08/26 Today's Date: 11/17/2023   History of Present Illness Pt is a 89 yo male that presented to ED for abdominal pain, nausea, lethargy. Workup for acute toxic metabolic encephalopathy, noted for new onset afib as well. PMH of  CAD, IBS HTN, anxiety(tried on multiple meds SSRIs/benzos), psoriasis, chronic back pain, iron deficiency anemia    PT Comments  Significant improvement in alertness and participation this session, and patient asking for pancakes for breakfast and pleased to be able to mobilize. Patient was able to stand and take a few steps with assistance. Activity tolerance limited by fatigue. Recommend to continue PT to maximize independence and facilitate return to prior level of function. Consider rehabilitation < 3 hours/day pending patient/family goals.    If plan is discharge home, recommend the following: A lot of help with walking and/or transfers;A lot of help with bathing/dressing/bathroom;Assistance with feeding;Assist for transportation;Assistance with cooking/housework;Help with stairs or ramp for entrance;Supervision due to cognitive status   Can travel by private vehicle     No  Equipment Recommendations  None recommended by PT    Recommendations for Other Services       Precautions / Restrictions Precautions Precautions: Fall Recall of Precautions/Restrictions: Impaired Restrictions Weight Bearing Restrictions Per Provider Order: No     Mobility  Bed Mobility Overal bed mobility: Needs Assistance Bed Mobility: Supine to Sit, Sit to Supine     Supine to sit: Min assist Sit to supine: Mod assist   General bed mobility comments: assistance for trunk support to sit upright. assistance mostly for LE support to return to bed    Transfers Overall transfer level: Needs assistance Equipment used: Rolling walker (2 wheels) Transfers: Sit to/from Stand Sit to Stand:  Mod assist           General transfer comment: lifting and lowering assistance provided with cues for technique    Ambulation/Gait Ambulation/Gait assistance: Min assist             General Gait Details: patient able to take several side steps with steadying assistance using rolling walker. standing activity tolerance limited by fatigue   Stairs             Wheelchair Mobility     Tilt Bed    Modified Rankin (Stroke Patients Only)       Balance Overall balance assessment: Needs assistance Sitting-balance support: Feet supported, No upper extremity supported Sitting balance-Leahy Scale: Fair     Standing balance support: Bilateral upper extremity supported Standing balance-Leahy Scale: Poor Standing balance comment: external support from therapist in addition to heavy reliance on rolling walker for support                            Communication Communication Communication: No apparent difficulties  Cognition Arousal: Alert Behavior During Therapy: WFL for tasks assessed/performed   PT - Cognitive impairments: History of cognitive impairments                       PT - Cognition Comments: cooperative, alret, awake, and interative. patient is oriented to person, place, time of day Following commands: Impaired Following commands impaired: Follows one step commands with increased time    Cueing Cueing Techniques: Verbal cues  Exercises      General Comments General comments (skin integrity, edema, etc.): patient ate part of a banana during session and was  asking for pancakes      Pertinent Vitals/Pain Pain Assessment Pain Assessment: No/denies pain    Home Living                          Prior Function            PT Goals (current goals can now be found in the care plan section) Acute Rehab PT Goals Patient Stated Goal: to return to PLOF PT Goal Formulation: With family Time For Goal Achievement:  11/26/23 Potential to Achieve Goals: Good Progress towards PT goals: Progressing toward goals    Frequency    Min 2X/week      PT Plan      Co-evaluation              AM-PAC PT 6 Clicks Mobility   Outcome Measure  Help needed turning from your back to your side while in a flat bed without using bedrails?: A Lot Help needed moving from lying on your back to sitting on the side of a flat bed without using bedrails?: A Lot Help needed moving to and from a bed to a chair (including a wheelchair)?: A Lot Help needed standing up from a chair using your arms (e.g., wheelchair or bedside chair)?: A Lot Help needed to walk in hospital room?: A Lot Help needed climbing 3-5 steps with a railing? : A Lot 6 Click Score: 12    End of Session   Activity Tolerance: Patient tolerated treatment well Patient left: in bed;with call bell/phone within reach Nurse Communication: Mobility status PT Visit Diagnosis: Other abnormalities of gait and mobility (R26.89);Difficulty in walking, not elsewhere classified (R26.2);Muscle weakness (generalized) (M62.81)     Time: 9149-9084 PT Time Calculation (min) (ACUTE ONLY): 25 min  Charges:    $Therapeutic Activity: 23-37 mins PT General Charges $$ ACUTE PT VISIT: 1 Visit                     Randine Essex, PT, MPT    Randine LULLA Essex 11/17/2023, 10:18 AM

## 2023-11-17 NOTE — Plan of Care (Signed)
  Problem: Cardiac: Goal: Will achieve and/or maintain adequate cardiac output Outcome: Progressing   Problem: Physical Regulation: Goal: Complications related to the disease process, condition or treatment will be avoided or minimized Outcome: Progressing   Problem: Education: Goal: Knowledge of General Education information will improve Description: Including pain rating scale, medication(s)/side effects and non-pharmacologic comfort measures Outcome: Progressing   Problem: Health Behavior/Discharge Planning: Goal: Ability to manage health-related needs will improve Outcome: Progressing   Problem: Clinical Measurements: Goal: Ability to maintain clinical measurements within normal limits will improve Outcome: Progressing Goal: Will remain free from infection Outcome: Progressing Goal: Diagnostic test results will improve Outcome: Progressing Goal: Respiratory complications will improve Outcome: Progressing Goal: Cardiovascular complication will be avoided Outcome: Progressing   Problem: Activity: Goal: Risk for activity intolerance will decrease Outcome: Progressing   Problem: Nutrition: Goal: Adequate nutrition will be maintained Outcome: Progressing   Problem: Coping: Goal: Level of anxiety will decrease Outcome: Progressing   Problem: Elimination: Goal: Will not experience complications related to urinary retention Outcome: Progressing   Problem: Pain Managment: Goal: General experience of comfort will improve and/or be controlled Outcome: Progressing   Problem: Safety: Goal: Ability to remain free from injury will improve Outcome: Progressing   Problem: Skin Integrity: Goal: Risk for impaired skin integrity will decrease Outcome: Progressing   Problem: Cardiac: Goal: Ability to achieve and maintain adequate cardiopulmonary perfusion will improve Outcome: Progressing

## 2023-11-17 NOTE — TOC Progression Note (Addendum)
 Transition of Care [facility]) - Progression Note    Patient Details  Name: Allen David MRN: [id number] Date of Birth: 11-12-1927  Transition of Care [facility]) CM/SW Contact  Elouise LULLA Capri, RN 11/17/2023, 2:57 PM  Clinical Narrative:     Per chart review, patient has a bed at [facility]. CM call to [facility], Admissions, phone: [phone] regarding bed confirmation. Patient will not require auth. CM call to patient's daughter, Nena, phone: [phone] regarding SNF placement at [facility]. No answer, CM left message for return call.    CM returned call to patient's daughter, Nena, regarding [facility] and coordination of care for admission. Per patient's daughter, requests CM to send SNF referral to [facility]. CM faxed SNF referral to [facility]. CM will follow up. Expected Discharge Plan and Services    SNF    Social Determinants of Health (SDOH) Interventions SDOH Screenings   Food Insecurity: No Food Insecurity (11/12/2023)  Housing: Low Risk  (11/12/2023)  Transportation Needs: No Transportation Needs (11/12/2023)  Utilities: Not At Risk (11/12/2023)  Depression (PHQ2-9): High Risk (08/04/2023)  Financial Resource Strain: Low Risk  (09/29/2023)   Received from [facility]  Social Connections: Socially Isolated (11/12/2023)  Stress: No Stress Concern Present (02/21/2020)  Tobacco Use: Medium Risk (11/11/2023)    Readmission Risk Interventions     No data to display

## 2023-11-17 NOTE — Progress Notes (Signed)
 PROGRESS NOTE    Allen David   FMW:[id number] DOB: 1927-11-12  DOA: 11/11/2023 Date of Service: 11/17/23 which is hospital day 5  PCP: Clinic, [facility] course / significant events:   HPI: Allen David is a 89 y.o. male with medical history significant for CAD, IBS HTN, anxiety(tried on multiple meds SSRIs/benzos), psoriasis, chronic back pain, iron deficiency anemia, being admitted with abdominal pain, nausea and lethargy of uncertain etiology. History provided by daughter and grandson - stated he feels started with nausea and abdominal discomfort x2 to 3 days, similar to his GERD, progressively worsened, became very lethargic. At baseline he is oriented, manages his finances, walks his dogs.   06/25: to ED. Workup with lactic acidosis but otherwise essentially unrevealing. AFib rate 100 converted to NSR w/ fluids.  06/26: admitted to hospitalist service early AM. PT/OT recs for SNF rehab. UDS (+)Cannabinoid. Very confused, somnolent, later agitated 06/27-06/29: cognition much improved. Pending placement for SNF rehab.  06/30: more confused/somnolent today, not eating. AKI. Increased WBC but no SIRS/sepsis, infectious w/u pending. Consult to palliative care.  07/01: pt is lucid again today and pleasant. Family meeting today w/ daughters and grandson - suspect he may continue to have med sensitivity and/or waxing and waning lucidity, I think will benefit from neuropsych eval outpatient, [facility] consult here for medication guidance for tx severe anxiety.     Consultants:  Palliative Care   Procedures/Surgeries: none      ASSESSMENT & PLAN:  Acute toxic-metabolic encephalopathy - improved but then recurrence then improved again  Labs and imaging unrevealing other than UDS (+)Cannabinoids, pt denies use of THC/CBD/D8   No localizing signs of infection Suspecting possible medication toxicity from antianxiety medications in combination with possible mild  dehydration from poor oral intake related to nausea TSH and B12 WNL We will hold anxiety meds until mentation closer to baseline and slowly reintroduce --> reintroduced w/ low dose seoquel suspect this might be why he's more somnolent yesterday holding all po meds and see if improved --> today is lucid  Suspect vascular dementia, malnourishment (given his frailty I do not think he has been able to care for himself well at home / not meeting calorie needs) complicating his clinical status  BH consult   AKI - resolved Hyponatremia - resolved Low po intake likely dehydrated  IV fluids  Monitor BMP  Leukocytosis without SIRS/Sepsis - resolved Infectious w/u neg Monitor CBC  CT Head: chronic microvascular ischemic disease and loss brain volume, likely clinically significant, suspect possible vascular dementia  CT Head: empty sella, uncertain clinical significance but in light of nausea concern may correlate to intracranial HTN though less likely in absence of HA/VC Monitor mental status  D/w neurology - no concern for empty sella  Paroxysmal atrial fibrillation, new onset  one episode  1st degree AV block  EKG showed A-fib at 100---> post hydration EKG showed sinus at 63. TSH WNL Echo no concerns  Monitor --> back on telemetry today w/ bradycardia on exam but HR has been regular Given fall risk avoid anticoag    Generalized anxiety disorder with panic attacks PCP reports history of 2-3 anxiety attacks a day with sweating and shakiness Reportedly has been on multiple different meds for anxiety including benzos, Valium, Xanax  which were all discontinued by the [place] Currently on duloxetine  60, and patient self increased his BuSpar  to 30 mg per PCP documentation on 10/16/2023 Given suspicion for medication related encephalopathy, held meds to slowly  reintroduce / titrate  geripsych eval requested Holding any CNS/psych meds for now    Goals of care: I discussed w/ family re: my suspicion of  medication effect likely complicated by vascular dementia, and given his level of frailty I suspect that he has not been taking care of himself very well at home.  Pt is alert today and is amenable to daughter managing his medications and finances  Holding meds for now   IDA (iron deficiency anemia) Hemoglobin near baseline On vitamin B shots and iron at home  Monitor CBC S/p iron infusion    Soft tissue mass adjacent to L clavicle  CT chest  Elevated ferritin Question inflammatory source?  Monitor outpatient   Nausea and vomiting - resolved Acute lactic acidosis Sepsis not suspected as not meeting other criteria Suspect lactic acidosis secondary to medication versus GI losses Symptomatic care    Chronic back pain No acute issues suspected Nonnarcotic pain control - acetaminophen    IBS (irritable bowel syndrome) Takes Protonix  and Pepto-Bismol Symptomatic care    Hypertension Holding home amlodipine  due to lower blood pressure, to resume if/when appropriate   CAD, NATIVE VESSEL No chest pain, troponin negative.   No ischemic changes on EKG ASA   Rash Question guttate psoriasis? Is not consistent w/ meningitis  Monitor    No concenrs based on BMI: Body mass index is 20.66 kg/m.[name] Significantly low or high BMI is associated with higher medical risk.  Underweight - under 18  overweight - 25 to 29 obese - 30 or more Class 1 obesity: BMI of 30.0 to 34 Class 2 obesity: BMI of 35.0 to 39 Class 3 obesity: BMI of 40.0 to 49 Super Morbid Obesity: BMI 50-59 Super-super Morbid Obesity: BMI 60+ Healthy nutrition and physical activity advised as adjunct to other disease management and risk reduction treatments    DVT prophylaxis: lovenox  IV fluids: none Nutrition: per SLP Central lines / other devices: none  Code Status: DNR ACP documentation reviewed:  none on file in [facility]  [facility] needs: SNF placement  Medical barriers to dispo: psych eval today, anticipate  medically stable tomorrow .              Subjective / Brief ROS:  Pt unable to contirbute   Family Communication: daughter is at bedside also spoke to other daughter Nena over the phone 11/17/23 2:34 PM      Objective Findings:  Vitals:   11/16/23 1955 11/17/23 0540 11/17/23 0751 11/17/23 1209  BP: (!) 140/66 139/75 133/77 123/79  Pulse: 72 72 64 88  Resp:   18 16  Temp: 98.2 F (36.8 C) (!) 96.9 F (36.1 C) (!) 97.2 F (36.2 C) (!) 97.5 F (36.4 C)  TempSrc:      SpO2: 98% 92% 100% 90%  Weight:      Height:        Intake/Output Summary (Last 24 hours) at 11/17/2023 1434 Last data filed at 11/17/2023 1420 Gross per 24 hour  Intake 1000.46 ml  Output 725 ml  Net 275.46 ml    Filed Weights   11/13/23 0604 11/15/23 0500 11/16/23 0428  Weight: 64.9 kg 68.4 kg 69.2 kg    Examination:  Physical Exam Constitutional:      General: He is not in acute distress.    Appearance: He is ill-appearing (thin, frail).   Cardiovascular:     Rate and Rhythm: Normal rate and regular rhythm.  Pulmonary:     Effort: Pulmonary effort is normal.  Breath sounds: Normal breath sounds.  Abdominal:     General: Bowel sounds are normal.     Palpations: Abdomen is soft.     Tenderness: There is no abdominal tenderness.   Skin:    General: Skin is warm and dry.     Findings: Rash (improving) present.   Neurological:     General: No focal deficit present.     Mental Status: He is alert and oriented to person, place, and time.          Scheduled Medications:   acetaminophen   650 mg Oral Q6H   enoxaparin  (LOVENOX ) injection  40 mg Subcutaneous Q24H   polyethylene glycol  17 g Oral Daily   sodium chloride  flush  3 mL Intravenous Q12H    Continuous Infusions:    PRN Medications:  haloperidol  lactate, ondansetron  **OR** ondansetron  (ZOFRAN ) IV  Antimicrobials from admission:  Anti-infectives (From admission, onward)    None           Data  Reviewed:  I have personally reviewed the following...  CBC: Recent Labs  Lab 11/11/23 2243 11/12/23 1654 11/16/23 1354 11/17/23 0801  WBC 6.5 4.6 11.8* 6.5  HGB 10.0* 8.2* 7.6* 8.8*  HCT 30.2* 24.9* 22.6* 26.7*  MCV 89.9 89.9 88.3 87.8  PLT 101* 75* 87* 101*   Basic Metabolic Panel: Recent Labs  Lab 11/11/23 2243 11/12/23 1654 11/16/23 1354 11/17/23 0801  NA 133* 136 133* 138  K 3.9 3.5 3.6 4.5  CL 102 107 103 107  CO2 18* 22 20* 25  GLUCOSE 123* 91 98 86  BUN 23 22 24* 23  CREATININE 0.99 0.92 1.58* 1.21  CALCIUM  8.4* 7.9* 7.6* 8.2*   GFR: Estimated Creatinine Clearance: 36.5 mL/min (by C-G formula based on SCr of 1.21 mg/dL). Liver Function Tests: Recent Labs  Lab 11/12/23 0026  AST 29  ALT 12  ALKPHOS 64  BILITOT 1.3*  PROT 6.3*  ALBUMIN 3.3*   Recent Labs  Lab 11/12/23 0026  LIPASE 37   No results for input(s): AMMONIA in the last 168 hours. Coagulation Profile: No results for input(s): INR, PROTIME in the last 168 hours. Cardiac Enzymes: No results for input(s): CKTOTAL, CKMB, CKMBINDEX, TROPONINI in the last 168 hours. BNP (last 3 results) No results for input(s): PROBNP in the last 8760 hours. HbA1C: No results for input(s): HGBA1C in the last 72 hours. CBG: Recent Labs  Lab 11/12/23 0519  GLUCAP 114*   Lipid Profile: No results for input(s): CHOL, HDL, LDLCALC, TRIG, CHOLHDL, LDLDIRECT in the last 72 hours. Thyroid  Function Tests: No results for input(s): TSH, T4TOTAL, FREET4, T3FREE, THYROIDAB in the last 72 hours.  Anemia Panel: No results for input(s): VITAMINB12, FOLATE, FERRITIN, TIBC, IRON, RETICCTPCT in the last 72 hours.  Most Recent Urinalysis On File:     Component Value Date/Time   COLORURINE AMBER (A) 11/16/2023 1555   APPEARANCEUR TURBID (A) 11/16/2023 1555   LABSPEC 1.023 11/16/2023 1555   PHURINE 8.0 11/16/2023 1555   GLUCOSEU NEGATIVE 11/16/2023 1555   HGBUR  NEGATIVE 11/16/2023 1555   BILIRUBINUR NEGATIVE 11/16/2023 1555   BILIRUBINUR small 08/01/2022 1703   KETONESUR NEGATIVE 11/16/2023 1555   PROTEINUR 100 (A) 11/16/2023 1555   UROBILINOGEN 1.0 08/01/2022 1703   NITRITE NEGATIVE 11/16/2023 1555   LEUKOCYTESUR NEGATIVE 11/16/2023 1555   Sepsis Labs: @LABRCNTIP (procalcitonin:4,lacticidven:4) Microbiology: Recent Results (from the past 240 hours)  Culture, blood (Routine X 2) w Reflex to ID Panel     Status: None (Preliminary  result)   Collection Time: 11/16/23  5:37 PM   Specimen: BLOOD  Result Value Ref Range Status   Specimen Description BLOOD LEFT ANTECUBITAL  Final   Special Requests   Final    BOTTLES DRAWN AEROBIC AND ANAEROBIC Blood Culture adequate volume   Culture   Final    NO GROWTH < 24 HOURS Performed at [facility], [address], [place], [place] [address]    Report Status PENDING  Incomplete  Culture, blood (Routine X 2) w Reflex to ID Panel     Status: None (Preliminary result)   Collection Time: 11/16/23  5:48 PM   Specimen: BLOOD  Result Value Ref Range Status   Specimen Description BLOOD BLOOD LEFT WRIST  Final   Special Requests   Final    BOTTLES DRAWN AEROBIC AND ANAEROBIC Blood Culture adequate volume   Culture   Final    NO GROWTH < 24 HOURS Performed at [facility], [address] [place], [place] [address]    Report Status PENDING  Incomplete      Radiology Studies last 3 days: DG Chest Port 1 View Result Date: 11/16/2023 CLINICAL DATA:  Leukocytosis, altered mental status. EXAM: PORTABLE CHEST 1 VIEW COMPARISON:  11/11/2023. FINDINGS: The heart size and mediastinal contours are stable. There is atherosclerotic calcification of the aorta. No consolidation, effusion, or pneumothorax is seen. No acute osseous abnormality. IMPRESSION: No active disease. Electronically Signed   By: Leita Birmingham M.D.   On: 11/16/2023 19:32        Time spent: 35 min  Additinoal time w/  Advanced care planning w/ pt and family 35 min    Brieonna Crutcher, DO [facility] 11/17/2023, 2:34 PM    Dictation software may have been used to generate the above note. Typos may occur and escape review in typed/dictated notes. Please contact Dr Marsa directly for clarity if needed.  Staff may message me via secure chat in Epic  but this may not receive an immediate response,  please page me for urgent matters!  If 7PM-7AM, please contact night coverage [email or web address]

## 2023-11-18 DIAGNOSIS — F4322 Adjustment disorder with anxiety: Secondary | ICD-10-CM

## 2023-11-18 DIAGNOSIS — G9341 Metabolic encephalopathy: Secondary | ICD-10-CM | POA: Diagnosis not present

## 2023-11-18 LAB — CBC
HCT: 23.7 % — ABNORMAL LOW (ref 39.0–52.0)
Hemoglobin: 8 g/dL — ABNORMAL LOW (ref 13.0–17.0)
MCH: 29.2 pg (ref 26.0–34.0)
MCHC: 33.8 g/dL (ref 30.0–36.0)
MCV: 86.5 fL (ref 80.0–100.0)
Platelets: 102 10*3/uL — ABNORMAL LOW (ref 150–400)
RBC: 2.74 MIL/uL — ABNORMAL LOW (ref 4.22–5.81)
RDW: 15.8 % — ABNORMAL HIGH (ref 11.5–15.5)
WBC: 4.3 10*3/uL (ref 4.0–10.5)
nRBC: 0 % (ref 0.0–0.2)

## 2023-11-18 LAB — BASIC METABOLIC PANEL WITH GFR
Anion gap: 7 (ref 5–15)
BUN: 23 mg/dL (ref 8–23)
CO2: 23 mmol/L (ref 22–32)
Calcium: 8 mg/dL — ABNORMAL LOW (ref 8.9–10.3)
Chloride: 107 mmol/L (ref 98–111)
Creatinine, Ser: 1.06 mg/dL (ref 0.61–1.24)
GFR, Estimated: >60 mL/min (ref >60)
Glucose, Bld: 77 mg/dL (ref 70–99)
Potassium: 3.5 mmol/L (ref 3.5–5.1)
Sodium: 137 mmol/L (ref 135–145)

## 2023-11-18 MED ORDER — SACCHAROMYCES BOULARDII 250 MG PO CAPS
250.0000 mg | ORAL_CAPSULE | Freq: Two times a day (BID) | ORAL | Status: DC
  Administered 2023-11-18 – 2023-11-19 (×3): 250 mg via ORAL
  Filled 2023-11-18 (×3): qty 1

## 2023-11-18 MED ORDER — MIRTAZAPINE 15 MG PO TBDP
15.0000 mg | ORAL_TABLET | Freq: Every day | ORAL | Status: DC
  Administered 2023-11-18: 15 mg via ORAL
  Filled 2023-11-18: qty 1

## 2023-11-18 MED ORDER — METOCLOPRAMIDE HCL 10 MG PO TABS
5.0000 mg | ORAL_TABLET | Freq: Three times a day (TID) | ORAL | Status: DC
  Administered 2023-11-18: 5 mg via ORAL
  Filled 2023-11-18 (×2): qty 0.5

## 2023-11-18 MED ORDER — TRAZODONE HCL 50 MG PO TABS
25.0000 mg | ORAL_TABLET | Freq: Every evening | ORAL | Status: DC | PRN
  Administered 2023-11-18: 25 mg via ORAL
  Filled 2023-11-18: qty 1

## 2023-11-18 NOTE — Progress Notes (Signed)
 PROGRESS NOTE  Allen David    DOB: May 30, 1927, 89 y.o.  FMW:[id number]    Code Status: Limited: Do not attempt resuscitation (DNR) -DNR-LIMITED -Do Not Intubate/DNI    DOA: 11/11/2023   LOS: 6   Brief hospital course  Allen David is a 89 y.o. male with medical history significant for CAD, IBS HTN, anxiety(tried on multiple meds SSRIs/benzos), psoriasis, chronic back pain, iron deficiency anemia, being admitted with abdominal pain, nausea and lethargy of uncertain etiology. History provided by daughter and grandson - stated he feels started with nausea and abdominal discomfort x2 to 3 days, similar to his GERD, progressively worsened, became very lethargic. At baseline he is oriented, manages his finances, walks his dogs.    06/25: to ED. Workup with lactic acidosis but otherwise essentially unrevealing. AFib rate 100 converted to NSR w/ fluids.  06/26: admitted to hospitalist service early AM. PT/OT recs for SNF rehab. UDS (+)Cannabinoid. Very confused, somnolent, later agitated 06/27-06/29: cognition much improved. Pending placement for SNF rehab.  06/30: more confused/somnolent today, not eating. AKI. Increased WBC but no SIRS/sepsis, infectious w/u pending. Consult to palliative care.  07/01: pt is lucid again today and pleasant. Family meeting today w/ daughters and grandson - suspect he may continue to have med sensitivity and/or waxing and waning lucidity, I think will benefit from neuropsych eval outpatient, [facility] consult here for medication guidance for tx severe anxiety.   11/18/23 -psychiatry saw today- appreciate your input. He is otherwise stable for dc when SNF available. TOC consulted.   Assessment & Plan  Principal Problem:   Acute toxic-metabolic encephalopathy Active Problems:   Nausea and vomiting   Acute lactic acidosis   Atrial fibrillation, new onset (HCC)   Generalized anxiety disorder with panic attacks   CAD, NATIVE VESSEL   Hypertension   IBS (irritable bowel  syndrome)   Chronic back pain   IDA (iron deficiency anemia)   Encephalopathy  Acute toxic-metabolic encephalopathy - waxing and waning  Labs and imaging unrevealing other than UDS (+)Cannabinoids, pt denies use of THC/CBD/D8   No localizing signs of infection Suspecting possible medication toxicity from antianxiety medications in combination with possible mild dehydration from poor oral intake related to nausea TSH and B12 WNL - psychiatry consulted- appreciate your recs.   - medication management as ordered to improve anxiety control  Suspect vascular dementia, malnourishment PT/OT recommending SNF at dc   AKI - resolved Hyponatremia - resolved Low po intake likely dehydrated    Leukocytosis without SIRS/Sepsis - resolved Infectious w/u neg Monitor CBC   CT Head: chronic microvascular ischemic disease and loss brain volume, likely clinically significant, suspect possible vascular dementia  CT Head: empty sella, uncertain clinical significance but in light of nausea concern may correlate to intracranial HTN though less likely in absence of HA/VC Monitor mental status  D/w neurology - no concern for empty sella   Paroxysmal atrial fibrillation, new onset  one episode  1st degree AV block  EKG showed A-fib at 100---> post hydration EKG showed sinus at 63. TSH WNL Echo no concerns. No concerns on telemetry  Given fall risk avoid anticoag    Generalized anxiety disorder with panic attacks PCP reports history of 2-3 anxiety attacks a day with sweating and shakiness Reportedly has been on multiple different meds for anxiety including benzos, Valium, Xanax  which were all discontinued by the [place] Currently on duloxetine  60, and patient self increased his BuSpar  to 30 mg per PCP documentation on 10/16/2023 geripsych eval  requested Remeron at bedtime Ativan  PRN Consider seroquel at bedtime if not improved on remeron which we hope will also increase his appetite.  Pain management.    Chronic pain- CT chest yesterday revealed acute or subacute compression fracture of T12 superior endplate.  - analgesia PRN   Goals of care: I discussed w/ family re: my suspicion of medication effect likely complicated by vascular dementia, and given his level of frailty I suspect that he has not been taking care of himself very well at home.  Pt is alert today and is amenable to daughter managing his medications and finances    IDA (iron deficiency anemia) Hemoglobin near baseline On vitamin B shots and iron at home  Monitor CBC S/p iron infusion- continue outpatient   Soft tissue mass adjacent to L clavicle  CT chest- no discrete mass identified.    Elevated ferritin Question inflammatory source?  Monitor outpatient    Nausea and vomiting - resolved Acute lactic acidosis Sepsis not suspected as not meeting other criteria Suspect lactic acidosis secondary to medication versus GI losses Symptomatic care     IBS (irritable bowel syndrome) Takes Protonix  and Pepto-Bismol Symptomatic care    Hypertension Holding home amlodipine  due to lower blood pressure, to resume if/when appropriate   CAD, NATIVE VESSEL No chest pain, troponin negative.   No ischemic changes on EKG ASA   Rash Question guttate psoriasis? Is not consistent w/ meningitis  Monitor   Body mass index is 21.61 kg/m.  VTE ppx: enoxaparin  (LOVENOX ) injection 40 mg Start: 11/12/23 1000  Diet:     Diet   DIET DYS 3 Room service appropriate? Yes with Assist; Fluid consistency: Thin   Consultants: Psychiatry   Subjective 11/18/23    Pt reports feeling well overall. He has poor appetite but denies nausea, abdominal pain, diarrhea. He does have h/o IBS.    Objective  Blood pressure 130/70, pulse (!) 57, temperature 97.8 F (36.6 C), resp. rate 16, height 5' 10 (1.778 m), weight 68.3 kg, SpO2 97%.  Intake/Output Summary (Last 24 hours) at 11/18/2023 0755 Last data filed at 11/17/2023 1420 Gross per  24 hour  Intake 0 ml  Output 500 ml  Net -500 ml   Filed Weights   11/15/23 0500 11/16/23 0428 11/18/23 0500  Weight: 68.4 kg 69.2 kg 68.3 kg    Physical Exam:  General: awake, alert, NAD HEENT: atraumatic, clear conjunctiva, anicteric sclera, MMM, hearing grossly normal Respiratory: normal respiratory effort. Cardiovascular: quick capillary refill, normal S1/S2, RRR, no JVD, murmurs Gastrointestinal: soft, NT, ND Nervous: A&O x3. no gross focal neurologic deficits, normal speech Extremities: moves all equally, no edema, normal tone Skin: dry, intact, normal temperature, normal color. No rashes, lesions or ulcers on exposed skin Psychiatry: normal mood, congruent affect  Labs   I have personally reviewed the following labs and imaging studies CBC    Component Value Date/Time   WBC 4.3 11/18/2023 0434   RBC 2.74 (L) 11/18/2023 0434   HGB 8.0 (L) 11/18/2023 0434   HGB 9.7 (L) 10/31/2020 1011   HCT 23.7 (L) 11/18/2023 0434   HCT 29.3 (L) 10/31/2020 1011   PLT 102 (L) 11/18/2023 0434   PLT 114 (L) 10/31/2020 1011   MCV 86.5 11/18/2023 0434   MCV 90 10/31/2020 1011   MCH 29.2 11/18/2023 0434   MCHC 33.8 11/18/2023 0434   RDW 15.8 (H) 11/18/2023 0434   RDW 14.0 10/31/2020 1011   LYMPHSABS 1.2 07/25/2023 1512   LYMPHSABS 1.7 10/31/2020  1011   MONOABS 0.6 07/25/2023 1512   EOSABS 0.0 07/25/2023 1512   EOSABS 0.1 10/31/2020 1011   BASOSABS 0.0 07/25/2023 1512   BASOSABS 0.0 10/31/2020 1011      Latest Ref Rng & Units 11/18/2023    4:34 AM 11/17/2023    8:01 AM 11/16/2023    1:54 PM  BMP  Glucose 70 - 99 mg/dL 77  86  98   BUN 8 - 23 mg/dL 23  23  24    Creatinine 0.61 - 1.24 mg/dL 8.93  8.78  8.41   Sodium 135 - 145 mmol/L 137  138  133   Potassium 3.5 - 5.1 mmol/L 3.5  4.5  3.6   Chloride 98 - 111 mmol/L 107  107  103   CO2 22 - 32 mmol/L 23  25  20    Calcium  8.9 - 10.3 mg/dL 8.0  8.2  7.6     CT CHEST WO CONTRAST Result Date: 11/17/2023 CLINICAL DATA:  Mass near the  left clavicle. EXAM: CT CHEST WITHOUT CONTRAST TECHNIQUE: Multidetector CT imaging of the chest was performed following the standard protocol without IV contrast. RADIATION DOSE REDUCTION: This exam was performed according to the departmental dose-optimization program which includes automated exposure control, adjustment of the mA and/or kV according to patient size and/or use of iterative reconstruction technique. COMPARISON:  Chest radiograph dated 11/16/2023. FINDINGS: Evaluation of this exam is limited in the absence of intravenous contrast. Cardiovascular: There is mild cardiomegaly. Trace pericardial effusion. There is 3 vessel coronary vascular calcification. Mild atherosclerotic calcification of the thoracic aorta. The ascending aorta is mildly dilated measures 4 cm in diameter. The central pulmonary arteries are grossly unremarkable. Mediastinum/Nodes: No hilar or mediastinal adenopathy. The esophagus is grossly unremarkable. No mediastinal fluid collection. Lungs/Pleura: Minimal bibasilar subpleural atelectasis/scarring. Left upper lobe subpleural plaque measures 5 mm in thickness (42/4). Additional calcified plaque in the anterior right upper lobe likely sequela prior asbestos exposure. No focal consolidation, pleural effusion, pneumothorax. The central airways are patent. Upper Abdomen: No acute abnormality. Musculoskeletal: There is osteopenia with degenerative changes of the spine. There is compression fracture of superior endplate of T12 with approximately 15% loss of vertebral body height, likely acute or subacute. Correlation with clinical exam and point tenderness recommended. No retropulsion. No discrete mass noted in the region of the left clavicle. This area however is only partially included in this study. IMPRESSION: 1. No discrete mass noted in the visualized soft tissues of the left clavicle. 2. Acute or subacute compression fracture of superior endplate of T12. Correlation with clinical  exam and point tenderness recommended. 3. Mild cardiomegaly. 4.  Aortic Atherosclerosis (ICD10-I70.0). Electronically Signed   By: Vanetta Chou M.D.   On: 11/17/2023 21:15   DG Chest Port 1 View Result Date: 11/16/2023 CLINICAL DATA:  Leukocytosis, altered mental status. EXAM: PORTABLE CHEST 1 VIEW COMPARISON:  11/11/2023. FINDINGS: The heart size and mediastinal contours are stable. There is atherosclerotic calcification of the aorta. No consolidation, effusion, or pneumothorax is seen. No acute osseous abnormality. IMPRESSION: No active disease. Electronically Signed   By: Leita Birmingham M.D.   On: 11/16/2023 19:32    Disposition Plan & Communication  Patient status: Inpatient  Admitted From: Home Planned disposition location: Skilled nursing facility Anticipated discharge date: TBD pending placement   Family Communication: none at bedside    Author: Marien LITTIE Piety, DO [facility] 11/18/2023, 7:55 AM   Available by Epic secure chat 7AM-7PM. If 7PM-7AM, please contact night-coverage.  [facility] contact information found on [email or web address].

## 2023-11-18 NOTE — Plan of Care (Signed)
  Problem: Cardiac: Goal: Will achieve and/or maintain adequate cardiac output Outcome: Progressing   Problem: Education: Goal: Knowledge of General Education information will improve Description: Including pain rating scale, medication(s)/side effects and non-pharmacologic comfort measures Outcome: Progressing   Problem: Clinical Measurements: Goal: Ability to maintain clinical measurements within normal limits will improve Outcome: Progressing Goal: Respiratory complications will improve Outcome: Progressing

## 2023-11-18 NOTE — Plan of Care (Signed)
  Problem: Education: Goal: Knowledge of condition and prescribed therapy will improve Outcome: Progressing   Problem: Nutrition: Goal: Adequate nutrition will be maintained Outcome: Progressing

## 2023-11-18 NOTE — Consult Note (Signed)
 [facility] Psychiatric Consult Initial  Patient Name: .Allen David  MRN: [id number]  DOB: 1927-08-29  Consult Order details:  Orders (From admission, onward)     Start     Ordered   11/18/23 0911  IP CONSULT TO PSYCHIATRY       Ordering Provider: Lenon Marien CROME, MD  Provider:  (Not yet assigned)  Question Answer Comment  Location [facility]   Reason for Consult? overdose      11/18/23 0910             Mode of Visit: In person    Psychiatry Consult Evaluation  Service Date: November 18, 2023 LOS:  LOS: 6 days  Chief Complaint management of anxiety  Primary Psychiatric Diagnoses  Adjustment disorder with anxiety 2.   3.    Assessment  Allen David is a 89 y.o. male admitted: Medicallyfor 11/11/2023 11:35 PM with medical history significant for CAD, IBS HTN, anxiety(tried on multiple meds SSRIs/benzos), psoriasis, chronic back pain, iron deficiency anemia, being admitted with abdominal pain, nausea and lethargy of uncertain etiology. History provided by daughter and grandson - stated he feels started with nausea and abdominal discomfort x2 to 3 days, similar to his GERD, progressively worsened, became very lethargic. At baseline he is oriented, manages his finances, walks his dogs. ED Workup shown with lactic acidosis but otherwise essentially unrevealing. AFib rate 100 converted to NSR w/ fluids. Patient is admitted to hospitalist service early AM. PT/OT recs for SNF rehab. UDS (+)Cannabinoid. Very confused, somnolent, later agitated. Psychiatry was consulted for assessment of his confusion and agitation, anxiety.   On assessment patient is noted to be alert, oriented to self, location and situation.  He reports severe back pain due to bulging disc as being the main trigger for his anxiety.  His family reports patient being on benzos that helped him with the anxiety and they did acknowledge that his back pain and inability to do his ADLs at times, aging  is putting him down.  Family is requesting patient to be comfortable.  Given the multiple medical complexities and initially patient is noted to be reglan on which is a contraindication of multiple psychotropics including SSRIs, SNRIs, any antipsychotics that work on serotonergic receptors.  Primary provider agreed to hold off Reglan.  Will initiate Remeron 15 mg nightly to help with anxiety/appetite.  No indication of inpatient psychiatric admission at this time.  Patient is a good candidate for SNF.  Diagnoses:  Active Hospital problems: Principal Problem:   Acute toxic-metabolic encephalopathy Active Problems:   CAD, NATIVE VESSEL   Hypertension   IBS (irritable bowel syndrome)   Nausea and vomiting   Chronic back pain   IDA (iron deficiency anemia)   Acute lactic acidosis   Atrial fibrillation, new onset (HCC)   Generalized anxiety disorder with panic attacks   Encephalopathy    Plan   ## Psychiatric Medication Recommendations:  Remeron 15 mg qhs  ## Medical Decision Making Capacity: Not specifically addressed in this encounter  ## Further Work-up:  --  -- most recent EKG on 11/17/23 had QtC of 461    ## Disposition:-- There are no psychiatric contraindications to discharge at this time  ## Behavioral / Environmental: -Delirium Precautions: Delirium Interventions for Nursing and Staff: - RN to open blinds every AM. - To Bedside: Glasses, hearing aide, and pt's own shoes. Make available to patients. when possible and encourage use. - Encourage po fluids when appropriate, keep fluids within reach. -  OOB to chair with meals. - Passive ROM exercises to all extremities with AM & PM care. - RN to assess orientation to person, time and place QAM and PRN. - Recommend extended visitation hours with familiar family/friends as feasible. - Staff to minimize disturbances at night. Turn off television when pt asleep or when not in use.    ## Safety and Observation Level:  - Based on my  clinical evaluation, I estimate the patient to be at NO risk of self harm in the current setting. - At this time, we recommend  routine. This decision is based on my review of the chart including patient's history and current presentation, interview of the patient, mental status examination, and consideration of suicide risk including evaluating suicidal ideation, plan, intent, suicidal or self-harm behaviors, risk factors, and protective factors. This judgment is based on our ability to directly address suicide risk, implement suicide prevention strategies, and develop a safety plan while the patient is in the clinical setting. Please contact our team if there is a concern that risk level has changed.  CSSR Risk Category:C-SSRS RISK CATEGORY: No Risk  Suicide Risk Assessment: Patient has following modifiable risk factors for suicide: none identified  Patient has following non-modifiable or demographic risk factors for suicide: male gender Patient has the following protective factors against suicide: Supportive family and no history of suicide attempts  Thank you for this consult request. Recommendations have been communicated to the primary team.  We will sign off at this time.   Marvelyn Bouchillon, MD       History of Present Illness   Allen David is a 89 y.o. male admitted: Medicallyfor 11/11/2023 11:35 PM with medical history significant for CAD, IBS HTN, anxiety(tried on multiple meds SSRIs/benzos), psoriasis, chronic back pain, iron deficiency anemia, being admitted with abdominal pain, nausea and lethargy of uncertain etiology. History provided by daughter and grandson - stated he feels started with nausea and abdominal discomfort x2 to 3 days, similar to his GERD, progressively worsened, became very lethargic. At baseline he is oriented, manages his finances, walks his dogs. ED Workup shown with lactic acidosis but otherwise essentially unrevealing. AFib rate 100 converted to NSR w/ fluids.  Patient is admitted to hospitalist service early AM. PT/OT recs for SNF rehab. UDS (+)Cannabinoid. Very confused, somnolent, later agitated. Psychiatry was consulted for assessment of his confusion and agitation, anxiety. Patient Report:  Patient is noted to be sitting in chair, awake and alert.  He is able to tell that he is at [facility], month is July and the year is 2025, president as Nancyann Frohlich.  He talks about having severe back pain limiting his life, making him feel helpless and anxious.  He reports wanting his pain to be under control and also help with his anxiety.  Psych ROS:  Depression: Denies anhedonia denies hopelessness but reports helplessness, poor appetite and poor sleep due to pain, denies suicidal/homicidal ideation/intent/plan Anxiety: Has history of anxiety for 30 years and was tried on multiple SSRIs/benzos, has history of panic attacks but denies any panic attacks in the last 24 hours Mania (lifetime and current): Not displaying any grandiose delusions Psychosis: (lifetime and current): Not responding to internal stimuli and denies auditory/visual hallucinations  Collateral information:  Contacted daughter who informed the provider that patient has prolonged history of anxiety for 30 years and was seeing a psychiatrist at [facility].  They tried multiple SSRIs and the latest medications or Celexa which was changed to Cymbalta .  Patient was also on benzos and most recent benzo was Xanax .  Given the age and multiple medical complexities patient was recently titrated off of benzos.  Daughter describes that patient was comfortable with benzos and he has been more anxious.  During this hospitalization she informs that he was tried on trazodone which made him very sleepy and Seroquel made him very groggy.    Psychiatric and Social History  Psychiatric History:  Information collected from patient/daughter  Prev Dx/Sx: GAD Current Psych Provider: at [facility] Meds  (current): Buspar , duloxetine  Previous Med Trials: xanax  Therapy: denies  Prior Psych Hospitalization: denies  Prior Self Harm: denies Prior Violence: denies  Family Psych History: denies Family Hx suicide: denies  Social History:  Educational Hx: graduated HS Occupational Hx: retired Armed forces operational officer Hx: denies Living Situation: with daughter and grandson Spiritual Hx: christian Access to weapons/lethal means: has guns and educated on safety locks   Substance History Alcohol: social drinker   Tobacco: denies Illicit drugs: denies Prescription drug abuse: denies Rehab hx: denies  Exam Findings  Physical Exam: reviewed and agree with the physical exam findings done by the medical provider Vital Signs:  Temp:  [97.7 F (36.5 C)-97.8 F (36.6 C)] 97.8 F (36.6 C) (07/02 0421) Pulse Rate:  [57-67] 57 (07/02 0745) Resp:  [16-18] 16 (07/02 0745) BP: (118-133)/(62-70) 130/70 (07/02 0745) SpO2:  [96 %-100 %] 97 % (07/02 0745) Weight:  [68.3 kg] 68.3 kg (07/02 0500) Blood pressure 130/70, pulse (!) 57, temperature 97.8 F (36.6 C), resp. rate 16, height 5' 10 (1.778 m), weight 68.3 kg, SpO2 97%. Body mass index is 21.61 kg/m.    Mental Status Exam: General Appearance: Fairly Groomed  Orientation:  Negative  Memory:  Immediate;   Poor Recent;   Fair Remote;   Poor  Concentration:  Concentration: Fair and Attention Span: Fair  Recall:  Poor  Attention  Fair  Eye Contact:  Fair  Speech:  Clear and Coherent  Language:  Fair  Volume:  Normal  Mood: fine  Affect:  Appropriate  Thought Process:  Coherent  Thought Content:  Logical  Suicidal Thoughts:  No  Homicidal Thoughts:  No  Judgement:  Fair  Insight:  Fair  Psychomotor Activity:  Normal  Akathisia:  No  Fund of Knowledge:  Fair      Assets:  Resilience Social Support  Cognition:  Impaired,  Mild  ADL's:  Impaired  AIMS (if indicated):        Other History   These have been pulled in through the EMR,  reviewed, and updated if appropriate.  Family History:  The patient's family history includes Cancer in his father; Diabetes in his brother, brother, and sister; Hypertension in his mother; Stroke in his mother; Ulcers in his mother.  Medical History: Past Medical History:  Diagnosis Date   Anxiety    Back injury    with chronic pain   CAD in native artery    Chronic low back pain    Depression    ED (erectile dysfunction)    Elevated PSA    HLD (hyperlipidemia)    HTN (hypertension)    IBS (irritable bowel syndrome)    Malaise and fatigue    Pernicious anemia    Vitamin B deficiency     Surgical History: Past Surgical History:  Procedure Laterality Date   CARPAL TUNNEL RELEASE     EYE SURGERY     VASECTOMY       Medications:   Current Facility-Administered  Medications:    acetaminophen  (TYLENOL ) tablet 650 mg, 650 mg, Oral, Q6H, 650 mg at 11/18/23 1259 **OR** [DISCONTINUED] acetaminophen  (TYLENOL ) suppository 650 mg, 650 mg, Rectal, Q6H PRN, Cleatus Delayne GAILS, MD   enoxaparin  (LOVENOX ) injection 40 mg, 40 mg, Subcutaneous, Q24H, Cleatus Delayne V, MD, 40 mg at 11/18/23 1007   metoCLOPramide (REGLAN) tablet 5 mg, 5 mg, Oral, TID AC, Anderson, Chelsey L, MD, 5 mg at 11/18/23 1259   ondansetron  (ZOFRAN ) tablet 4 mg, 4 mg, Oral, Q6H PRN **OR** ondansetron  (ZOFRAN ) injection 4 mg, 4 mg, Intravenous, Q6H PRN, Cleatus Delayne V, MD, 4 mg at 11/15/23 1437   polyethylene glycol (MIRALAX / GLYCOLAX) packet 17 g, 17 g, Oral, Daily, Marsa Edelman, DO, 17 g at 11/18/23 1007   saccharomyces boulardii (FLORASTOR) capsule 250 mg, 250 mg, Oral, BID, Lenon Pons L, MD, 250 mg at 11/18/23 1259   sodium chloride  flush (NS) 0.9 % injection 3 mL, 3 mL, Intravenous, Q12H, Cleatus Delayne V, MD, 3 mL at 11/18/23 1008   traZODone (DESYREL) tablet 25 mg, 25 mg, Oral, QHS PRN, Mansy, Jan A, MD, 25 mg at 11/18/23 0059  Allergies: Allergies  Allergen Reactions   Lexapro  [Escitalopram  Oxalate]      hypertension   Tetanus Toxoids Swelling    Dedrick Heffner, MD

## 2023-11-18 NOTE — Progress Notes (Signed)
 Occupational Therapy Treatment Patient Details Name: Allen David MRN: [id number] DOB: 1928-03-23 Today's Date: 11/18/2023   History of present illness Pt is a 89 yo male that presented to ED for abdominal pain, nausea, lethargy. Workup for acute toxic metabolic encephalopathy, noted for new onset afib as well. PMH of  CAD, IBS HTN, anxiety(tried on multiple meds SSRIs/benzos), psoriasis, chronic back pain, iron deficiency anemia   OT comments  Upon entering the room, pt supine in bed and sleeping soundly. Pt reports feeling very fatigued but agreeable to therapy with max encouragement. Pt needing min A for supine >sit and he washes face with set up A. Pt with rash on back and asking therapist to wash it for him. Pt with good sitting balance and no LOB notes. Pt appears to be following commands well and showing increased safety awareness.Pt declines attempts to stand or transfer but requests to remain seated on EOB. Bed alarm activated and call bell within reach upon exiting the room.       If plan is discharge home, recommend the following:  A lot of help with walking and/or transfers;A lot of help with bathing/dressing/bathroom;Direct supervision/assist for medications management;Direct supervision/assist for financial management;Assist for transportation;Supervision due to cognitive status   Equipment Recommendations  Other (comment) (defer to next venue of care)       Precautions / Restrictions Precautions Precautions: Fall Recall of Precautions/Restrictions: Impaired       Mobility Bed Mobility Overal bed mobility: Needs Assistance Bed Mobility: Supine to Sit, Rolling Rolling: Min assist   Supine to sit: Min assist          Transfers                   General transfer comment: pt refused secondary to fatigue     Balance Overall balance assessment: Needs assistance Sitting-balance support: Feet supported, No upper extremity supported Sitting balance-Leahy  Scale: Good Sitting balance - Comments: sitting EOB                                   ADL either performed or assessed with clinical judgement   ADL Overall ADL's : Needs assistance/impaired     Grooming: Wash/dry face;Wash/dry hands;Set up;Supervision/safety;Sitting                                      Extremity/Trunk Assessment Upper Extremity Assessment Upper Extremity Assessment: Generalized weakness   Lower Extremity Assessment Lower Extremity Assessment: Generalized weakness        Vision Patient Visual Report: No change from baseline           Communication Communication Communication: Impaired Factors Affecting Communication: Hearing impaired   Cognition Arousal: Alert Behavior During Therapy: WFL for tasks assessed/performed Cognition: Cognition impaired   Orientation impairments: Time, Situation       Executive functioning impairment (select all impairments): Problem solving, Reasoning, Organization, Initiation, Sequencing                   Following commands: Impaired Following commands impaired: Follows one step commands with increased time      Cueing   Cueing Techniques: Verbal cues, Tactile cues             Pertinent Vitals/ Pain       Pain Assessment Pain Assessment: No/denies pain  Frequency  Min 2X/week        Progress Toward Goals  OT Goals(current goals can now be found in the care plan section)  Progress towards OT goals: Progressing toward goals         Co-evaluation        PT goals addressed during session: Mobility/safety with mobility;Balance        AM-PAC OT 6 Clicks Daily Activity     Outcome Measure   Help from another person eating meals?: A Little Help from another person taking care of personal grooming?: A Little Help from another person toileting, which includes using toliet, bedpan, or urinal?: A Lot Help from another person bathing (including  washing, rinsing, drying)?: A Lot Help from another person to put on and taking off regular upper body clothing?: A Little Help from another person to put on and taking off regular lower body clothing?: A Lot 6 Click Score: 15    End of Session Equipment Utilized During Treatment: Rolling walker (2 wheels)  OT Visit Diagnosis: Unsteadiness on feet (R26.81);Other abnormalities of gait and mobility (R26.89);Other symptoms and signs involving cognitive function   Activity Tolerance Patient tolerated treatment well;Patient limited by fatigue   Patient Left in bed;with call bell/phone within reach;with bed alarm set   Nurse Communication          Time: 8948-8892 OT Time Calculation (min): 16 min  Charges: OT General Charges $OT Visit: 1 Visit OT Treatments $Self Care/Home Management : 8-22 mins  Allen Claude, MS, OTR/L , CBIS ascom [phone]  11/18/23, 2:10 PM

## 2023-11-18 NOTE — Progress Notes (Signed)
 Physical Therapy Treatment Patient Details Name: Allen David MRN: [id number] DOB: Mar 03, 1928 Today's Date: 11/18/2023   History of Present Illness Pt is a 89 yo male that presented to ED for abdominal pain, nausea, lethargy. Workup for acute toxic metabolic encephalopathy, noted for new onset afib as well. PMH of  CAD, IBS HTN, anxiety(tried on multiple meds SSRIs/benzos), psoriasis, chronic back pain, iron deficiency anemia    PT Comments  Today's tx involved continuation of general mobility with increase in ambulation distance. Pt performed STS with CGA and RW, no LOB experienced but pt requiring increased time to complete transition with verbal cueing on use of DME. Ambulation distance this date increased to 79' with RW and chair follow, step to pattern demo with decreased step length and width, pt requiring seated return to room in recliner.Pt left in recliner this date with chair alarm. Pt is progressing toward meeting goals and improving QoL. PT to follow acutely as appropriate.     If plan is discharge home, recommend the following: A lot of help with walking and/or transfers;A lot of help with bathing/dressing/bathroom;Assistance with feeding;Assist for transportation;Assistance with cooking/housework;Help with stairs or ramp for entrance;Supervision due to cognitive status   Can travel by private vehicle     No  Equipment Recommendations  None recommended by PT    Recommendations for Other Services       Precautions / Restrictions Precautions Precautions: Fall Recall of Precautions/Restrictions: Impaired Restrictions Weight Bearing Restrictions Per Provider Order: No     Mobility  Bed Mobility               General bed mobility comments: pt found sitting EOB upon entering room    Transfers Overall transfer level: Needs assistance Equipment used: Rolling walker (2 wheels) Transfers: Sit to/from Stand Sit to Stand: Contact guard assist           General  transfer comment: cues for technique with RW    Ambulation/Gait Ambulation/Gait assistance: Contact guard assist Gait Distance (Feet): 40 Feet Assistive device: Rolling walker (2 wheels) Gait Pattern/deviations: Step-to pattern, Decreased step length - right, Decreased step length - left       General Gait Details: chair follow, verbal and tactile cueing on facilitation of RW, step to pattern, no LOB   Stairs             Wheelchair Mobility     Tilt Bed    Modified Rankin (Stroke Patients Only)       Balance Overall balance assessment: Needs assistance Sitting-balance support: Feet supported, No upper extremity supported Sitting balance-Leahy Scale: Fair Sitting balance - Comments: sitting EOB   Standing balance support: Bilateral upper extremity supported Standing balance-Leahy Scale: Fair Standing balance comment: use of RW, no LOB                            Communication Communication Communication: No apparent difficulties Factors Affecting Communication: Difficulty expressing self  Cognition Arousal: Alert Behavior During Therapy: WFL for tasks assessed/performed   PT - Cognitive impairments: History of cognitive impairments Difficult to assess due to: Level of arousal                     PT - Cognition Comments: cooperative, alret, awake, and interative. patient is oriented to person, place, time of day Following commands: Impaired Following commands impaired: Follows one step commands with increased time    Cueing Cueing Techniques: Verbal cues,  Tactile cues  Exercises      General Comments        Pertinent Vitals/Pain Pain Assessment Pain Assessment: No/denies pain    Home Living                          Prior Function            PT Goals (current goals can now be found in the care plan section) Acute Rehab PT Goals Patient Stated Goal: to return to PLOF PT Goal Formulation: With family Time For Goal  Achievement: 11/26/23 Potential to Achieve Goals: Good Progress towards PT goals: Progressing toward goals    Frequency    Min 2X/week      PT Plan      Co-evaluation     PT goals addressed during session: Mobility/safety with mobility;Balance        AM-PAC PT 6 Clicks Mobility   Outcome Measure  Help needed turning from your back to your side while in a flat bed without using bedrails?: A Little Help needed moving from lying on your back to sitting on the side of a flat bed without using bedrails?: A Little Help needed moving to and from a bed to a chair (including a wheelchair)?: A Little Help needed standing up from a chair using your arms (e.g., wheelchair or bedside chair)?: A Lot Help needed to walk in hospital room?: A Lot Help needed climbing 3-5 steps with a railing? : A Lot 6 Click Score: 15    End of Session Equipment Utilized During Treatment: Gait belt Activity Tolerance: Patient tolerated treatment well Patient left: in chair;with call bell/phone within reach;with chair alarm set Nurse Communication: Mobility status PT Visit Diagnosis: Other abnormalities of gait and mobility (R26.89);Difficulty in walking, not elsewhere classified (R26.2);Muscle weakness (generalized) (M62.81)     Time: 8891-8879 PT Time Calculation (min) (ACUTE ONLY): 12 min  Charges:                              Ellawyn Wogan Romero-Perozo, SPT  11/18/2023, 11:36 AM

## 2023-11-19 DIAGNOSIS — D508 Other iron deficiency anemias: Secondary | ICD-10-CM

## 2023-11-19 DIAGNOSIS — F4322 Adjustment disorder with anxiety: Secondary | ICD-10-CM | POA: Diagnosis not present

## 2023-11-19 DIAGNOSIS — F419 Anxiety disorder, unspecified: Secondary | ICD-10-CM | POA: Diagnosis not present

## 2023-11-19 DIAGNOSIS — G9341 Metabolic encephalopathy: Secondary | ICD-10-CM | POA: Diagnosis not present

## 2023-11-19 LAB — CREATININE, SERUM
Creatinine, Ser: 1.02 mg/dL (ref 0.61–1.24)
GFR, Estimated: >60 mL/min (ref >60)

## 2023-11-19 MED ORDER — ACETAMINOPHEN 325 MG PO TABS
650.0000 mg | ORAL_TABLET | Freq: Four times a day (QID) | ORAL | Status: AC | PRN
Start: 2023-11-19 — End: ?

## 2023-11-19 MED ORDER — TRAZODONE HCL 50 MG PO TABS: 25.0000 mg | ORAL_TABLET | Freq: Every evening | ORAL | Status: AC | PRN

## 2023-11-19 MED ORDER — SACCHAROMYCES BOULARDII 250 MG PO CAPS
250.0000 mg | ORAL_CAPSULE | Freq: Two times a day (BID) | ORAL | Status: AC
Start: 2023-11-19 — End: ?

## 2023-11-19 MED ORDER — POLYETHYLENE GLYCOL 3350 17 G PO PACK: 17.0000 g | PACK | Freq: Every day | ORAL | Status: AC

## 2023-11-19 MED ORDER — HYDROCODONE-ACETAMINOPHEN 5-325 MG PO TABS: 1.0000 | ORAL_TABLET | ORAL | Status: DC | PRN

## 2023-11-19 MED ORDER — NALOXONE HCL 4 MG/0.1ML NA LIQD: 1.0000 | NASAL | Status: DC | PRN

## 2023-11-19 MED ORDER — HYDROCODONE-ACETAMINOPHEN 5-325 MG PO TABS: 1.0000 | ORAL_TABLET | Freq: Two times a day (BID) | ORAL | 0 refills | Status: AC | PRN

## 2023-11-19 MED ORDER — MIRTAZAPINE 15 MG PO TBDP: 15.0000 mg | ORAL_TABLET | Freq: Every day | ORAL | Status: AC

## 2023-11-19 NOTE — Plan of Care (Signed)
  Problem: Cardiac: Goal: Will achieve and/or maintain adequate cardiac output Outcome: Progressing   Problem: Health Behavior/Discharge Planning: Goal: Ability to manage health-related needs will improve Outcome: Progressing   Problem: Clinical Measurements: Goal: Cardiovascular complication will be avoided Outcome: Progressing   Problem: Elimination: Goal: Will not experience complications related to bowel motility Outcome: Progressing

## 2023-11-19 NOTE — Consult Note (Signed)
 [facility] Psychiatric Consult Follow up  Patient Name: .Allen David  MRN: [id number]  DOB: [birth year]  Consult Order details:  Orders (From admission, onward)     Start     Ordered   11/18/23 0911  IP CONSULT TO PSYCHIATRY       Ordering Provider: Lenon Marien CROME, MD  Provider:  (Not yet assigned)  Question Answer Comment  Location [facility]   Reason for Consult? overdose      11/18/23 0910             Mode of Visit: In person    Psychiatry Consult Evaluation  Service Date: November 19, 2023 LOS:  LOS: 7 days  Chief Complaint management of anxiety  Primary Psychiatric Diagnoses  Adjustment disorder with anxiety 2.   3.    Assessment  Allen David is a 89 y.o. male admitted: Medicallyfor 11/11/2023 11:35 PM with medical history significant for CAD, IBS HTN, anxiety(tried on multiple meds SSRIs/benzos), psoriasis, chronic back pain, iron deficiency anemia, being admitted with abdominal pain, nausea and lethargy of uncertain etiology. History provided by daughter and grandson - stated he feels started with nausea and abdominal discomfort x2 to 3 days, similar to his GERD, progressively worsened, became very lethargic. At baseline he is oriented, manages his finances, walks his dogs. ED Workup shown with lactic acidosis but otherwise essentially unrevealing. AFib rate 100 converted to NSR w/ fluids. Patient is admitted to hospitalist service early AM. PT/OT recs for SNF rehab. UDS (+)Cannabinoid. Very confused, somnolent, later agitated. Psychiatry was consulted for assessment of his confusion and agitation, anxiety.   On assessment patient consistently denies suicidal/homicidal ideation/plan.  Patient and his family report that he took BuSpar  to help with anxiety that is coming from his chronic poorly controlled pain.  He is noted to be more fixated on his pain and the GI symptoms.  He has good support from the family and looks forward to getting better  and going to SNF.  Patient is psychiatrically cleared for discharge.  Psychiatry signing off at this time  Diagnoses:  Active Hospital problems: Principal Problem:   Acute toxic-metabolic encephalopathy Active Problems:   CAD, NATIVE VESSEL   Hypertension   IBS (irritable bowel syndrome)   Nausea and vomiting   Chronic back pain   IDA (iron deficiency anemia)   Acute lactic acidosis   Atrial fibrillation, new onset (HCC)   Generalized anxiety disorder with panic attacks   Encephalopathy    Plan   ## Psychiatric Medication Recommendations:  Remeron 15 mg qhs  ## Medical Decision Making Capacity: Not specifically addressed in this encounter  ## Further Work-up:  --  -- most recent EKG on 11/17/23 had QtC of 461    ## Disposition:-- There are no psychiatric contraindications to discharge at this time  ## Behavioral / Environmental: -Delirium Precautions: Delirium Interventions for Nursing and Staff: - RN to open blinds every AM. - To Bedside: Glasses, hearing aide, and pt's own shoes. Make available to patients. when possible and encourage use. - Encourage po fluids when appropriate, keep fluids within reach. - OOB to chair with meals. - Passive ROM exercises to all extremities with AM & PM care. - RN to assess orientation to person, time and place QAM and PRN. - Recommend extended visitation hours with familiar family/friends as feasible. - Staff to minimize disturbances at night. Turn off television when pt asleep or when not in use.    ## Safety and Observation  Level:  - Based on my clinical evaluation, I estimate the patient to be at NO risk of self harm in the current setting. - At this time, we recommend  routine. This decision is based on my review of the chart including patient's history and current presentation, interview of the patient, mental status examination, and consideration of suicide risk including evaluating suicidal ideation, plan, intent, suicidal or self-harm  behaviors, risk factors, and protective factors. This judgment is based on our ability to directly address suicide risk, implement suicide prevention strategies, and develop a safety plan while the patient is in the clinical setting. Please contact our team if there is a concern that risk level has changed.  CSSR Risk Category:C-SSRS RISK CATEGORY: No Risk  Suicide Risk Assessment: Patient has following modifiable risk factors for suicide: none identified  Patient has following non-modifiable or demographic risk factors for suicide: male gender Patient has the following protective factors against suicide: Supportive family and no history of suicide attempts  Thank you for this consult request. Recommendations have been communicated to the primary team.  We will sign off at this time.   Lucy Boardman, MD       History of Present Illness   Allen David is a 89 y.o. male admitted: Medicallyfor 11/11/2023 11:35 PM with medical history significant for CAD, IBS HTN, anxiety(tried on multiple meds SSRIs/benzos), psoriasis, chronic back pain, iron deficiency anemia, being admitted with abdominal pain, nausea and lethargy of uncertain etiology. History provided by daughter and grandson - stated he feels started with nausea and abdominal discomfort x2 to 3 days, similar to his GERD, progressively worsened, became very lethargic. At baseline he is oriented, manages his finances, walks his dogs. ED Workup shown with lactic acidosis but otherwise essentially unrevealing. AFib rate 100 converted to NSR w/ fluids. Patient is admitted to hospitalist service early AM. PT/OT recs for SNF rehab. UDS (+)Cannabinoid. Very confused, somnolent, later agitated. Psychiatry was consulted for assessment of his confusion and agitation, anxiety. 11/19/2023: Patient is noted to be resting in bed and his daughter was bedside.  Patient reports being in.  And having stomach upset.  He is noted to be resting.  His daughter informed  the provider that he had some omelette and some banana and told his daughter that he has no further appetite and feels nauseous.  He reports resting well at night.  He denies suicidal/homicidal ideation/intent/plan.  He he looks frustrated about being in pain and is requesting some help.  Patient's daughter informed the provider that he received Tylenol  and is not helping with his pain.  Patient denies auditory/visual hallucinations.  Patient remains future oriented as he is looking forward to going to SNF and possibly get some appropriate pain management.  Collateral information:  Contacted daughter who informed the provider that patient has prolonged history of anxiety for 30 years and was seeing a psychiatrist at [facility].  They tried multiple SSRIs and the latest medications or Celexa which was changed to Cymbalta .  Patient was also on benzos and most recent benzo was Xanax .  Given the age and multiple medical complexities patient was recently titrated off of benzos.  Daughter describes that patient was comfortable with benzos and he has been more anxious.  During this hospitalization she informs that he was tried on trazodone which made him very sleepy and Seroquel made him very groggy.    Psychiatric and Social History  Psychiatric History:  Information collected from patient/daughter  Prev Dx/Sx: GAD Current Psych  Provider: at [facility] Meds (current): Buspar , duloxetine  Previous Med Trials: xanax  Therapy: denies  Prior Psych Hospitalization: denies  Prior Self Harm: denies Prior Violence: denies  Family Psych History: denies Family Hx suicide: denies  Social History:  Educational Hx: graduated HS Occupational Hx: retired Armed forces operational officer Hx: denies Living Situation: with daughter and grandson Spiritual Hx: christian Access to weapons/lethal means: has guns and educated on safety locks   Substance History Alcohol: social drinker   Tobacco: denies Illicit drugs: denies Prescription drug  abuse: denies Rehab hx: denies  Exam Findings  Physical Exam: reviewed and agree with the physical exam findings done by the medical provider Vital Signs:  Temp:  [97.7 F (36.5 C)-98 F (36.7 C)] 98 F (36.7 C) (07/03 0817) Pulse Rate:  [61-87] 87 (07/03 0817) Resp:  [16-18] 18 (07/03 0817) BP: (120-157)/(51-91) 157/91 (07/03 0817) SpO2:  [90 %-99 %] 98 % (07/03 0817) Weight:  [61.7 kg] 61.7 kg (07/03 0500) Blood pressure (!) 157/91, pulse 87, temperature 98 F (36.7 C), temperature source Oral, resp. rate 18, height 5' 10 (1.778 m), weight 61.7 kg, SpO2 98%. Body mass index is 19.52 kg/m.    Mental Status Exam: General Appearance: Fairly Groomed  Orientation:  Negative  Memory:  Immediate;   Poor Recent;   Fair Remote;   Poor  Concentration:  Concentration: Fair and Attention Span: Fair  Recall:  Poor  Attention  Fair  Eye Contact:  Fair  Speech:  Clear and Coherent  Language:  Fair  Volume:  Normal  Mood: fine  Affect:  Appropriate  Thought Process:  Coherent  Thought Content:  Logical  Suicidal Thoughts:  No  Homicidal Thoughts:  No  Judgement:  Fair  Insight:  Fair  Psychomotor Activity:  Normal  Akathisia:  No  Fund of Knowledge:  Fair      Assets:  Resilience Social Support  Cognition:  Impaired,  Mild  ADL's:  Impaired  AIMS (if indicated):        Other History   These have been pulled in through the EMR, reviewed, and updated if appropriate.  Family History:  The patient's family history includes Cancer in his father; Diabetes in his brother, brother, and sister; Hypertension in his mother; Stroke in his mother; Ulcers in his mother.  Medical History: Past Medical History:  Diagnosis Date   Anxiety    Back injury    with chronic pain   CAD in native artery    Chronic low back pain    Depression    ED (erectile dysfunction)    Elevated PSA    HLD (hyperlipidemia)    HTN (hypertension)    IBS (irritable bowel syndrome)    Malaise and  fatigue    Pernicious anemia    Vitamin B deficiency     Surgical History: Past Surgical History:  Procedure Laterality Date   CARPAL TUNNEL RELEASE     EYE SURGERY     VASECTOMY       Medications:   Current Facility-Administered Medications:    acetaminophen  (TYLENOL ) tablet 650 mg, 650 mg, Oral, Q6H, 650 mg at 11/19/23 0642 **OR** [DISCONTINUED] acetaminophen  (TYLENOL ) suppository 650 mg, 650 mg, Rectal, Q6H PRN, Cleatus Delayne GAILS, MD   enoxaparin  (LOVENOX ) injection 40 mg, 40 mg, Subcutaneous, Q24H, Cleatus Delayne V, MD, 40 mg at 11/19/23 0910   HYDROcodone-acetaminophen  (NORCO/VICODIN) 5-325 MG per tablet 1-2 tablet, 1-2 tablet, Oral, Q4H PRN, Lenon Marien CROME, MD   mirtazapine (REMERON SOL-TAB) disintegrating tablet 15 mg,  15 mg, Oral, QHS, Sanchez Hemmer, MD, 15 mg at 11/18/23 2111   naloxone [facility]) nasal spray 4 mg/0.1 mL, 1 spray, Nasal, PRN, Lenon Marien CROME, MD   ondansetron  (ZOFRAN ) tablet 4 mg, 4 mg, Oral, Q6H PRN **OR** ondansetron  (ZOFRAN ) injection 4 mg, 4 mg, Intravenous, Q6H PRN, Cleatus Hoof V, MD, 4 mg at 11/15/23 1437   polyethylene glycol (MIRALAX / GLYCOLAX) packet 17 g, 17 g, Oral, Daily, Alexander, Natalie, DO, 17 g at 11/19/23 9089   saccharomyces boulardii (FLORASTOR) capsule 250 mg, 250 mg, Oral, BID, Lenon Marien L, MD, 250 mg at 11/19/23 0910   sodium chloride  flush (NS) 0.9 % injection 3 mL, 3 mL, Intravenous, Q12H, Cleatus Hoof V, MD, 3 mL at 11/18/23 2112   traZODone (DESYREL) tablet 25 mg, 25 mg, Oral, QHS PRN, Mansy, Jan A, MD, 25 mg at 11/18/23 9940  Current Outpatient Medications:    atorvastatin  (LIPITOR) 20 MG tablet, Take 1 tablet (20 mg total) by mouth daily., Disp: 90 tablet, Rfl: 3   bismuth subsalicylate (PEPTO BISMOL) 262 MG/15ML suspension, Take 30 mLs by mouth every 6 (six) hours as needed., Disp: , Rfl:    Carboxymethylcellulose Sodium 0.25 % SOLN, Apply 1 drop to eye 3 (three) times daily., Disp: , Rfl:    ciclopirox  (PENLAC) 8 % solution, Apply 1 Application topically at bedtime. Apply over nail and surrounding skin. Apply daily over previous coat. After seven (7) days, may remove with alcohol and continue cycle., Disp: , Rfl:    clobetasol  cream (TEMOVATE ) 0.05 %, Apply 1 Application topically 2 (two) times daily., Disp: , Rfl:    cyanocobalamin  (VITAMIN B12) 1000 MCG/ML injection, Inject 1 mL (1,000 mcg total) into the muscle every 30 (thirty) days., Disp: 3 mL, Rfl: 0   ferrous sulfate  325 (65 FE) MG tablet, Take 325 mg by mouth daily with breakfast., Disp: , Rfl:    gabapentin (NEURONTIN) 100 MG capsule, Take 100 mg by mouth at bedtime., Disp: , Rfl:    glucosamine-chondroitin 500-400 MG tablet, Take 1 tablet by mouth daily., Disp: , Rfl:    HYDROcodone-acetaminophen  (NORCO/VICODIN) 5-325 MG tablet, Take 1 tablet by mouth 2 (two) times daily as needed for up to 5 days for severe pain (pain score 7-10)., Disp: 10 tablet, Rfl: 0   Multiple Vitamins-Minerals (MULTIVITAMIN WITH MINERALS) tablet, Take 1 tablet by mouth daily., Disp: , Rfl:    omeprazole (PRILOSEC) 20 MG capsule, Take 20 mg by mouth daily., Disp: , Rfl:    acetaminophen  (TYLENOL ) 325 MG tablet, Take 2 tablets (650 mg total) by mouth every 6 (six) hours as needed for mild pain (pain score 1-3) or moderate pain (pain score 4-6)., Disp: , Rfl:    mirtazapine (REMERON SOL-TAB) 15 MG disintegrating tablet, Take 1 tablet (15 mg total) by mouth at bedtime., Disp: , Rfl:    [START ON 11/20/2023] polyethylene glycol (MIRALAX / GLYCOLAX) 17 g packet, Take 17 g by mouth daily., Disp: , Rfl:    saccharomyces boulardii (FLORASTOR) 250 MG capsule, Take 1 capsule (250 mg total) by mouth 2 (two) times daily., Disp: , Rfl:    SYRINGE-NEEDLE, DISP, 3 ML (LUER LOCK SAFETY SYRINGES) 25G X 1 3 ML MISC, Inject 1 mL into the muscle every 30 (thirty) days., Disp: 12 each, Rfl: 0   traZODone (DESYREL) 50 MG tablet, Take 0.5 tablets (25 mg total) by mouth at bedtime as needed  for sleep., Disp: , Rfl:   Allergies: Allergies  Allergen Reactions  Lexapro  [Escitalopram  Oxalate]     hypertension   Tetanus Toxoids Swelling    Jossilyn Benda, MD

## 2023-11-19 NOTE — TOC Progression Note (Signed)
 Transition of Care [facility]) - Progression Note    Patient Details  Name: Allen David MRN: [id number] Date of Birth: 15-Dec-1927  [facility] CM/SW Contact  Lorraine LILLETTE Fenton, LCSW Phone Number: 11/19/2023, 12:43 PM  Clinical Narrative:     Pt clear for DC and clarified [facility] offers bed and pt not going to [facility] as indicated in the HUB. CSW reached out to daughter with update, CSW provided RR information to RN staff and advised facility that pt on list for transport once [facility] contacted.  No further TOC needs.     Barriers to Discharge: No Barriers Identified  Expected Discharge Plan and Services         Expected Discharge Date: 11/19/23                                     Social Determinants of Health (SDOH) Interventions SDOH Screenings   Food Insecurity: No Food Insecurity (11/12/2023)  Housing: Low Risk  (11/12/2023)  Transportation Needs: No Transportation Needs (11/12/2023)  Utilities: Not At Risk (11/12/2023)  Depression (PHQ2-9): High Risk (08/04/2023)  Financial Resource Strain: Low Risk  (09/29/2023)   Received from [facility]  Social Connections: Socially Isolated (11/12/2023)  Stress: No Stress Concern Present (02/21/2020)  Tobacco Use: Medium Risk (11/11/2023)    Readmission Risk Interventions     No data to display

## 2023-11-19 NOTE — Progress Notes (Incomplete)
 Pt report called to [facility] all questions answered.

## 2023-11-19 NOTE — Progress Notes (Incomplete)
 PROGRESS NOTE Allen David    DOB: 02-14-1928, 89 y.o.  FMW:[id number]    Code Status: Limited: Do not attempt resuscitation (DNR) -DNR-LIMITED -Do Not Intubate/DNI    DOA: 11/11/2023   LOS: 7  Brief hospital course  Allen David is a 89 y.o. male with medical history significant for CAD, IBS HTN, anxiety(tried on multiple meds SSRIs/benzos), psoriasis, chronic back pain, iron deficiency anemia, being admitted with abdominal pain, nausea and lethargy of uncertain etiology. At baseline he is oriented, manages his finances, walks his dogs.   ED course: Workup with lactic acidosis but otherwise essentially unrevealing. AFib rate 100 converted to NSR w/ fluids. UDS (+)Cannabinoid and history revealed that he had taken an increased dose of his buspar  which was in response to increased back pain and anxiety. Psychiatry evaluated and patient continues to deny any intention of overdose. His medications were modified to help improve his anxiety and pain management  06/27-06/29: cognition much improved. Pending placement for SNF rehab.  06/30: more confused/somnolent today, not eating. AKI. Increased WBC but no SIRS/sepsis, infectious w/u pending. Consult to palliative care.  07/01: pt is lucid again today and pleasant. Family meeting today w/ daughters and grandson - suspect he may continue to have med sensitivity and/or waxing and waning lucidity, I think will benefit from neuropsych eval outpatient, [place] consult here for medication guidance for tx severe anxiety.   11/19/23 -psychiatry saw today- appreciate your input. He is otherwise stable for dc when SNF available. TOC consulted.   pt was assessed-by Psychiatry due to unintentional overdose and found to be future oriented- not SI -or language to support he did not attempt to overdose.   Assessment & Plan  Principal Problem:   Acute toxic-metabolic encephalopathy Active Problems:   Nausea and vomiting   Acute lactic acidosis   Atrial fibrillation,  new onset (HCC)   Generalized anxiety disorder with panic attacks   CAD, NATIVE VESSEL   Hypertension   IBS (irritable bowel syndrome)   Chronic back pain   IDA (iron deficiency anemia)   Encephalopathy  Acute toxic-metabolic encephalopathy - waxing and waning  Labs and imaging unrevealing other than UDS (+)Cannabinoids, pt denies use of THC/CBD/D8   No localizing signs of infection Suspecting possible medication toxicity from antianxiety medications in combination with possible mild dehydration from poor oral intake related to nausea TSH and B12 WNL - psychiatry consulted- appreciate your recs.   - medication management as ordered to improve anxiety control  Suspect vascular dementia, malnourishment PT/OT recommending SNF at dc   AKI - resolved Hyponatremia - resolved Low po intake likely dehydrated    Leukocytosis without SIRS/Sepsis - resolved Infectious w/u neg Monitor CBC   CT Head: chronic microvascular ischemic disease and loss brain volume, likely clinically significant, suspect possible vascular dementia  CT Head: empty sella, uncertain clinical significance but in light of nausea concern may correlate to intracranial HTN though less likely in absence of HA/VC Monitor mental status  D/w neurology - no concern for empty sella   Paroxysmal atrial fibrillation, new onset  one episode  1st degree AV block  EKG showed A-fib at 100---> post hydration EKG showed sinus at 63. TSH WNL Echo no concerns. No concerns on telemetry  Given fall risk avoid anticoag    Generalized anxiety disorder with panic attacks PCP reports history of 2-3 anxiety attacks a day with sweating and shakiness Reportedly has been on multiple different meds for anxiety including benzos, Valium, Xanax  which were all  discontinued by the [facility] Currently on duloxetine  60, and patient self increased his BuSpar  to 30 mg per PCP documentation on 10/16/2023 geripsych eval requested Remeron at bedtime Ativan   PRN Consider seroquel at bedtime if not improved on remeron which we hope will also increase his appetite.  Pain management.   Chronic pain- CT chest yesterday revealed acute or subacute compression fracture of T12 superior endplate.  - analgesia PRN   Goals of care: I discussed w/ family re: my suspicion of medication effect likely complicated by vascular dementia, and given his level of frailty I suspect that he has not been taking care of himself very well at home.  Pt is alert today and is amenable to daughter managing his medications and finances    IDA (iron deficiency anemia) Hemoglobin near baseline On vitamin B shots and iron at home  Monitor CBC S/p iron infusion- continue outpatient   Soft tissue mass adjacent to L clavicle  CT chest- no discrete mass identified.    Elevated ferritin Question inflammatory source?  Monitor outpatient    Nausea and vomiting - resolved Acute lactic acidosis Sepsis not suspected as not meeting other criteria Suspect lactic acidosis secondary to medication versus GI losses Symptomatic care     IBS (irritable bowel syndrome) Takes Protonix  and Pepto-Bismol Symptomatic care    Hypertension Holding home amlodipine  due to lower blood pressure, to resume if/when appropriate   CAD, NATIVE VESSEL No chest pain, troponin negative.   No ischemic changes on EKG ASA   Rash Question guttate psoriasis? Is not consistent w/ meningitis  Monitor   Body mass index is 19.52 kg/m.  VTE ppx: enoxaparin  (LOVENOX ) injection 40 mg Start: 11/12/23 1000  Diet:     Diet   DIET DYS 3 Room service appropriate? Yes with Assist; Fluid consistency: Thin   Consultants: Psychiatry   Subjective 11/19/23    Pt reports feeling well overall. He has poor appetite but denies nausea, abdominal pain, diarrhea. He does have h/o IBS.    Objective  Blood pressure 130/70, pulse (!) 57, temperature 97.8 F (36.6 C), resp. rate 16, height 5' 10 (1.778 m),  weight 68.3 kg, SpO2 97%.  Intake/Output Summary (Last 24 hours) at 11/19/2023 0757 Last data filed at 11/19/2023 0450 Gross per 24 hour  Intake 243 ml  Output 1450 ml  Net -1207 ml   Filed Weights   11/16/23 0428 11/18/23 0500 11/19/23 0500  Weight: 69.2 kg 68.3 kg 61.7 kg    Physical Exam:  General: awake, alert, NAD HEENT: atraumatic, clear conjunctiva, anicteric sclera, MMM, hearing grossly normal Respiratory: normal respiratory effort. Cardiovascular: quick capillary refill, normal S1/S2, RRR, no JVD, murmurs Gastrointestinal: soft, NT, ND Nervous: A&O x3. no gross focal neurologic deficits, normal speech Extremities: moves all equally, no edema, normal tone Skin: dry, intact, normal temperature, normal color. No rashes, lesions or ulcers on exposed skin Psychiatry: normal mood, congruent affect  Labs   I have personally reviewed the following labs and imaging studies CBC    Component Value Date/Time   WBC 4.3 11/18/2023 0434   RBC 2.74 (L) 11/18/2023 0434   HGB 8.0 (L) 11/18/2023 0434   HGB 9.7 (L) 10/31/2020 1011   HCT 23.7 (L) 11/18/2023 0434   HCT 29.3 (L) 10/31/2020 1011   PLT 102 (L) 11/18/2023 0434   PLT 114 (L) 10/31/2020 1011   MCV 86.5 11/18/2023 0434   MCV 90 10/31/2020 1011   MCH 29.2 11/18/2023 0434   MCHC 33.8 11/18/2023 0434  RDW 15.8 (H) 11/18/2023 0434   RDW 14.0 10/31/2020 1011   LYMPHSABS 1.2 07/25/2023 1512   LYMPHSABS 1.7 10/31/2020 1011   MONOABS 0.6 07/25/2023 1512   EOSABS 0.0 07/25/2023 1512   EOSABS 0.1 10/31/2020 1011   BASOSABS 0.0 07/25/2023 1512   BASOSABS 0.0 10/31/2020 1011      Latest Ref Rng & Units 11/19/2023    5:00 AM 11/18/2023    4:34 AM 11/17/2023    8:01 AM  BMP  Glucose 70 - 99 mg/dL  77  86   BUN 8 - 23 mg/dL  23  23   Creatinine 9.38 - 1.24 mg/dL 8.97  8.93  8.78   Sodium 135 - 145 mmol/L  137  138   Potassium 3.5 - 5.1 mmol/L  3.5  4.5   Chloride 98 - 111 mmol/L  107  107   CO2 22 - 32 mmol/L  23  25   Calcium   8.9 - 10.3 mg/dL  8.0  8.2     CT CHEST WO CONTRAST Result Date: 11/17/2023 CLINICAL DATA:  Mass near the left clavicle. EXAM: CT CHEST WITHOUT CONTRAST TECHNIQUE: Multidetector CT imaging of the chest was performed following the standard protocol without IV contrast. RADIATION DOSE REDUCTION: This exam was performed according to the departmental dose-optimization program which includes automated exposure control, adjustment of the mA and/or kV according to patient size and/or use of iterative reconstruction technique. COMPARISON:  Chest radiograph dated 11/16/2023. FINDINGS: Evaluation of this exam is limited in the absence of intravenous contrast. Cardiovascular: There is mild cardiomegaly. Trace pericardial effusion. There is 3 vessel coronary vascular calcification. Mild atherosclerotic calcification of the thoracic aorta. The ascending aorta is mildly dilated measures 4 cm in diameter. The central pulmonary arteries are grossly unremarkable. Mediastinum/Nodes: No hilar or mediastinal adenopathy. The esophagus is grossly unremarkable. No mediastinal fluid collection. Lungs/Pleura: Minimal bibasilar subpleural atelectasis/scarring. Left upper lobe subpleural plaque measures 5 mm in thickness (42/4). Additional calcified plaque in the anterior right upper lobe likely sequela prior asbestos exposure. No focal consolidation, pleural effusion, pneumothorax. The central airways are patent. Upper Abdomen: No acute abnormality. Musculoskeletal: There is osteopenia with degenerative changes of the spine. There is compression fracture of superior endplate of T12 with approximately 15% loss of vertebral body height, likely acute or subacute. Correlation with clinical exam and point tenderness recommended. No retropulsion. No discrete mass noted in the region of the left clavicle. This area however is only partially included in this study. IMPRESSION: 1. No discrete mass noted in the visualized soft tissues of the left  clavicle. 2. Acute or subacute compression fracture of superior endplate of T12. Correlation with clinical exam and point tenderness recommended. 3. Mild cardiomegaly. 4.  Aortic Atherosclerosis (ICD10-I70.0). Electronically Signed   By: Vanetta Chou M.D.   On: 11/17/2023 21:15    Disposition Plan & Communication  Patient status: Inpatient  Admitted From: Home Planned disposition location: Skilled nursing facility Anticipated discharge date: TBD pending placement   Family Communication: none at bedside    Author: Marien LITTIE Piety, DO [facility] 11/19/2023, 7:57 AM   Available by Epic secure chat 7AM-7PM. If 7PM-7AM, please contact night-coverage.  [facility] contact information found on [email or web address].

## 2023-11-19 NOTE — Discharge Summary (Signed)
 Physician Discharge Summary  Patient: Allen David FMW:[id number] DOB: [birth year]   Code Status: Limited: Do not attempt resuscitation (DNR) -DNR-LIMITED -Do Not Intubate/DNI  Admit date: 11/11/2023 Discharge date: 11/19/2023 Disposition: Skilled nursing facility, PT, OT, nurse aid, and RN PCP: [facility], [facility]  Recommendations for Outpatient Follow-up:  Follow up with PCP within 1-2 weeks Regarding general hospital follow up and preventative care Recommend CBC. Continue iron supplement, infusions Follow up with pain management, spinal doctor for chronic pain control   Discharge Diagnoses:  Principal Problem:   Acute toxic-metabolic encephalopathy Active Problems:   Nausea and vomiting   Acute lactic acidosis   Atrial fibrillation, new onset (HCC)   Generalized anxiety disorder with panic attacks   CAD, NATIVE VESSEL   Hypertension   IBS (irritable bowel syndrome)   Chronic back pain   IDA (iron deficiency anemia)   Encephalopathy  Brief Hospital Course Summary: Allen David is a 89 y.o. male with medical history significant for CAD, IBS HTN, anxiety(tried on multiple meds SSRIs/benzos), psoriasis, chronic back pain, iron deficiency anemia, being admitted with abdominal pain, nausea and lethargy of uncertain etiology. At baseline he is oriented, manages his finances, walks his dogs.   ED course: Workup with lactic acidosis but otherwise essentially unrevealing. AFib rate 100 converted to NSR w/ fluids. UDS (+)Cannabinoid and history revealed that he had taken an increased dose of his buspar  which was in response to increased back pain and anxiety. Psychiatry evaluated and patient continues to deny any intention of overdose. He denies SI.  His medications were modified to help improve his anxiety and pain management as seen below.  Continues to have lower back pain- continue PT/OT and follow up outpatient with his spine doctor.   Due to anemia- aspirin  was held on  admission. Received iron supplement for iron deficiency anemia and can continue these outpatient. Infusion referral placed. There was no indication of acute bleed. Hgb 8.0 on day prior to dc. Did not require blood transfusion.   AKI and hyponatremia on admission likely due to poor PO intake and dehydration which resolved with supportive care.   He is at his mental baseline.   All other chronic conditions were treated with home medications.    Discharge Condition: Stable, improved Recommended discharge diet: Regular healthy diet  Consultations: Psychiatry   Procedures/Studies: None   Discharge Instructions     Amb Referral to Intravenous Iron Therapy   Complete by: As directed    You have been referred to [facility] Infusion team for IV Iron Infusions. The infusion pharmacy team will reach out to you with appointment information.    Primary Diagnosis Code for IV Iron: D50.9 - Iron deficiency Anemia   Secondary diagnosis code for IV iron: Other   Comment: unresolved by oral iron supplementation   Amb referral to AFIB Clinic   Complete by: As directed       Allergies as of 11/19/2023       Reactions   Lexapro  [escitalopram  Oxalate]    hypertension   Tetanus Toxoids Swelling        Medication List     STOP taking these medications    aspirin  81 MG chewable tablet   busPIRone  15 MG tablet Commonly known as: BUSPAR    DULoxetine  60 MG capsule Commonly known as: CYMBALTA    pantoprazole  40 MG tablet Commonly known as: PROTONIX        TAKE these medications    acetaminophen  325 MG tablet Commonly known  as: TYLENOL  Take 2 tablets (650 mg total) by mouth every 6 (six) hours as needed for mild pain (pain score 1-3) or moderate pain (pain score 4-6).   atorvastatin  20 MG tablet Commonly known as: LIPITOR Take 1 tablet (20 mg total) by mouth daily.   bismuth subsalicylate 262 MG/15ML suspension Commonly known as: PEPTO BISMOL Take 30 mLs by mouth every 6 (six)  hours as needed.   Carboxymethylcellulose Sodium 0.25 % Soln Apply 1 drop to eye 3 (three) times daily.   ciclopirox 8 % solution Commonly known as: PENLAC Apply 1 Application topically at bedtime. Apply over nail and surrounding skin. Apply daily over previous coat. After seven (7) days, may remove with alcohol and continue cycle.   clobetasol  cream 0.05 % Commonly known as: TEMOVATE  Apply 1 Application topically 2 (two) times daily.   cyanocobalamin  1000 MCG/ML injection Commonly known as: VITAMIN B12 Inject 1 mL (1,000 mcg total) into the muscle every 30 (thirty) days.   ferrous sulfate  325 (65 FE) MG tablet Take 325 mg by mouth daily with breakfast.   gabapentin 100 MG capsule Commonly known as: NEURONTIN Take 100 mg by mouth at bedtime.   glucosamine-chondroitin 500-400 MG tablet Take 1 tablet by mouth daily.   HYDROcodone-acetaminophen  5-325 MG tablet Commonly known as: NORCO/VICODIN Take 1 tablet by mouth 2 (two) times daily as needed for up to 5 days for severe pain (pain score 7-10).   Luer Lock Safety Syringes 25G X 1 3 ML Misc Generic drug: SYRINGE-NEEDLE (DISP) 3 ML Inject 1 mL into the muscle every 30 (thirty) days.   mirtazapine 15 MG disintegrating tablet Commonly known as: REMERON SOL-TAB Take 1 tablet (15 mg total) by mouth at bedtime.   multivitamin with minerals tablet Take 1 tablet by mouth daily.   omeprazole 20 MG capsule Commonly known as: PRILOSEC Take 20 mg by mouth daily.   polyethylene glycol 17 g packet Commonly known as: MIRALAX / GLYCOLAX Take 17 g by mouth daily. Start taking on: November 20, 2023   saccharomyces boulardii 250 MG capsule Commonly known as: FLORASTOR Take 1 capsule (250 mg total) by mouth 2 (two) times daily.   traZODone 50 MG tablet Commonly known as: DESYREL Take 0.5 tablets (25 mg total) by mouth at bedtime as needed for sleep.        Contact information for follow-up providers     [facility]  Follow up.   Why: hospital follow up Contact information: [address] [facility] [place] [address] [phone]              Contact information for after-discharge care     Destination     [facility] .   Service: Skilled Nursing Contact information: [address] [place]  [address] [phone]                    Subjective   Pt reports feeling alright. His back pain is currently minimal. He states that it was bothering him overnight. Denies any digestive complaints today.   All questions and concerns were addressed at time of discharge.  Objective  Blood pressure (!) 157/91, pulse 87, temperature 98 F (36.7 C), temperature source Oral, resp. rate 18, height 5' 10 (1.778 m), weight 61.7 kg, SpO2 98%.   General: Pt is alert, awake, not in acute distress Cardiovascular: RRR, S1/S2 +, no rubs, no gallops Respiratory: CTA bilaterally, no wheezing, no rhonchi Abdominal: Soft, NT, ND, bowel  sounds + Extremities: no edema, no cyanosis  The results of significant diagnostics from this hospitalization (including imaging, microbiology, ancillary and laboratory) are listed below for reference.   Imaging studies: CT CHEST WO CONTRAST Result Date: 11/17/2023 CLINICAL DATA:  Mass near the left clavicle. EXAM: CT CHEST WITHOUT CONTRAST TECHNIQUE: Multidetector CT imaging of the chest was performed following the standard protocol without IV contrast. RADIATION DOSE REDUCTION: This exam was performed according to the departmental dose-optimization program which includes automated exposure control, adjustment of the mA and/or kV according to patient size and/or use of iterative reconstruction technique. COMPARISON:  Chest radiograph dated 11/16/2023. FINDINGS: Evaluation of this exam is limited in the absence of intravenous contrast. Cardiovascular: There is mild cardiomegaly. Trace pericardial effusion. There is 3 vessel  coronary vascular calcification. Mild atherosclerotic calcification of the thoracic aorta. The ascending aorta is mildly dilated measures 4 cm in diameter. The central pulmonary arteries are grossly unremarkable. Mediastinum/Nodes: No hilar or mediastinal adenopathy. The esophagus is grossly unremarkable. No mediastinal fluid collection. Lungs/Pleura: Minimal bibasilar subpleural atelectasis/scarring. Left upper lobe subpleural plaque measures 5 mm in thickness (42/4). Additional calcified plaque in the anterior right upper lobe likely sequela prior asbestos exposure. No focal consolidation, pleural effusion, pneumothorax. The central airways are patent. Upper Abdomen: No acute abnormality. Musculoskeletal: There is osteopenia with degenerative changes of the spine. There is compression fracture of superior endplate of T12 with approximately 15% loss of vertebral body height, likely acute or subacute. Correlation with clinical exam and point tenderness recommended. No retropulsion. No discrete mass noted in the region of the left clavicle. This area however is only partially included in this study. IMPRESSION: 1. No discrete mass noted in the visualized soft tissues of the left clavicle. 2. Acute or subacute compression fracture of superior endplate of T12. Correlation with clinical exam and point tenderness recommended. 3. Mild cardiomegaly. 4.  Aortic Atherosclerosis (ICD10-I70.0). Electronically Signed   By: Vanetta Chou M.D.   On: 11/17/2023 21:15   DG Chest Port 1 View Result Date: 11/16/2023 CLINICAL DATA:  Leukocytosis, altered mental status. EXAM: PORTABLE CHEST 1 VIEW COMPARISON:  11/11/2023. FINDINGS: The heart size and mediastinal contours are stable. There is atherosclerotic calcification of the aorta. No consolidation, effusion, or pneumothorax is seen. No acute osseous abnormality. IMPRESSION: No active disease. Electronically Signed   By: Leita Birmingham M.D.   On: 11/16/2023 19:32    ECHOCARDIOGRAM COMPLETE Result Date: 11/12/2023    ECHOCARDIOGRAM REPORT   Patient Name:   Allen David Date of Exam: 11/12/2023 Medical Rec #:  [id number]       Height:       70.0 in Accession #:    [id number]      Weight:       144.0 lb Date of Birth:  [birth year]        BSA:          1.815 m Patient Age:    89 years        BP:           126/51 mmHg Patient Gender: M               HR:           67 bpm. Exam Location:  [facility] Procedure: 2D Echo, Cardiac Doppler and Color Doppler (Both Spectral and Color            Flow Doppler were utilized during procedure). Indications:     Syncope R55  History:  Patient has no prior history of Echocardiogram examinations.                  CAD; Risk Factors:Hypertension.  Sonographer:     Christopher Furnace Referring Phys:  [id number] DELAYNE LULLA SOLIAN Diagnosing Phys: Cara JONETTA Lovelace MD IMPRESSIONS  1. Left ventricular ejection fraction, by estimation, is 60 to 65%. The left ventricle has normal function. The left ventricle has no regional wall motion abnormalities. Left ventricular diastolic parameters are consistent with Grade I diastolic dysfunction (impaired relaxation).  2. Right ventricular systolic function is normal. The right ventricular size is moderately enlarged.  3. The mitral valve is normal in structure. Trivial mitral valve regurgitation.  4. The aortic valve is normal in structure. Aortic valve regurgitation is not visualized. FINDINGS  Left Ventricle: Left ventricular ejection fraction, by estimation, is 60 to 65%. The left ventricle has normal function. The left ventricle has no regional wall motion abnormalities. Strain was performed and the global longitudinal strain is indeterminate. Global longitudinal strain performed but not reported based on interpreter judgement due to suboptimal tracking. The left ventricular internal cavity size was normal in size. There is no left ventricular hypertrophy. Left ventricular diastolic parameters are consistent with Grade  I diastolic dysfunction (impaired relaxation). Right Ventricle: The right ventricular size is moderately enlarged. No increase in right ventricular wall thickness. Right ventricular systolic function is normal. Left Atrium: Left atrial size was normal in size. Right Atrium: Right atrial size was normal in size. Pericardium: There is no evidence of pericardial effusion. Mitral Valve: The mitral valve is normal in structure. Trivial mitral valve regurgitation. Tricuspid Valve: The tricuspid valve is normal in structure. Tricuspid valve regurgitation is mild. Aortic Valve: The aortic valve is normal in structure. Aortic valve regurgitation is not visualized. Aortic valve mean gradient measures 1.0 mmHg. Aortic valve peak gradient measures 2.9 mmHg. Aortic valve area, by VTI measures 3.08 cm. Pulmonic Valve: The pulmonic valve was normal in structure. Pulmonic valve regurgitation is not visualized. Aorta: The ascending aorta was not well visualized. IAS/Shunts: No atrial level shunt detected by color flow Doppler. Additional Comments: 3D was performed not requiring image post processing on an independent workstation and was indeterminate.  LEFT VENTRICLE PLAX 2D LVIDd:         4.30 cm   Diastology LVIDs:         2.80 cm   LV e' medial:    6.85 cm/s LV PW:         1.10 cm   LV E/e' medial:  10.1 LV IVS:        1.10 cm   LV e' lateral:   5.55 cm/s LVOT diam:     2.20 cm   LV E/e' lateral: 12.5 LV SV:         52 LV SV Index:   28 LVOT Area:     3.80 cm  RIGHT VENTRICLE RV Basal diam:  3.40 cm RV Mid diam:    3.10 cm LEFT ATRIUM           Index        RIGHT ATRIUM           Index LA diam:      3.10 cm 1.71 cm/m   RA Area:     14.70 cm LA Vol (A2C): 26.5 ml 14.60 ml/m  RA Volume:   32.00 ml  17.63 ml/m LA Vol (A4C): 22.5 ml 12.40 ml/m  AORTIC VALVE AV Area (Vmax):  3.12 cm AV Area (Vmean):   2.78 cm AV Area (VTI):     3.08 cm AV Vmax:           84.80 cm/s AV Vmean:          54.600 cm/s AV VTI:            0.168 m  AV Peak Grad:      2.9 mmHg AV Mean Grad:      1.0 mmHg LVOT Vmax:         69.50 cm/s LVOT Vmean:        39.900 cm/s LVOT VTI:          0.136 m LVOT/AV VTI ratio: 0.81  AORTA Ao Root diam: 3.60 cm MITRAL VALVE                TRICUSPID VALVE MV Area (PHT): 5.88 cm     TR Peak grad:   15.4 mmHg MV Decel Time: 129 msec     TR Vmax:        196.00 cm/s MV E velocity: 69.20 cm/s MV A velocity: 113.00 cm/s  SHUNTS MV E/A ratio:  0.61         Systemic VTI:  0.14 m                             Systemic Diam: 2.20 cm Cara JONETTA Lovelace MD Electronically signed by Cara JONETTA Lovelace MD Signature Date/Time: 11/12/2023/3:53:36 PM    Final    CT ABDOMEN PELVIS W CONTRAST Result Date: 11/12/2023 CLINICAL DATA:  Acute abdominal pain and nausea, initial encounter EXAM: CT ABDOMEN AND PELVIS WITH CONTRAST TECHNIQUE: Multidetector CT imaging of the abdomen and pelvis was performed using the standard protocol following bolus administration of intravenous contrast. RADIATION DOSE REDUCTION: This exam was performed according to the departmental dose-optimization program which includes automated exposure control, adjustment of the mA and/or kV according to patient size and/or use of iterative reconstruction technique. CONTRAST:  OMNIPAQUE  IOHEXOL  300 MG/ML  SOLN COMPARISON:  11/18/2016 FINDINGS: Lower chest: Subpleural fibrotic changes are noted. No focal infiltrate is seen. Hepatobiliary: No focal liver abnormality is seen. No gallstones, gallbladder wall thickening, or biliary dilatation. Pancreas: Unremarkable. No pancreatic ductal dilatation or surrounding inflammatory changes. Spleen: Normal in size without focal abnormality. Adrenals/Urinary Tract: Adrenal glands show a stable 10 mm nodule on the left unchanged from 2018 consistent with a benign adenoma. Kidneys demonstrate cystic change bilaterally similar to that seen on the prior study. No follow-up is recommended. No renal calculi or obstructive changes are seen. The  bladder is partially distended. Stomach/Bowel: Scattered diverticular change of the colon is noted without evidence of diverticulitis. The appendix is not well visualized and may have been surgically removed. No inflammatory changes to suggest appendicitis are noted. Small bowel and stomach are within normal limits. Vascular/Lymphatic: Aortic atherosclerosis. No enlarged abdominal or pelvic lymph nodes. Reproductive: Prostate is unremarkable. Other: No abdominal wall hernia or abnormality. No abdominopelvic ascites. Musculoskeletal: No acute or significant osseous findings. IMPRESSION: Diverticulosis without diverticulitis. Stable left adrenal nodule consistent with adenoma. No acute abnormality to correspond with the given clinical history is noted. Electronically Signed   By: Oneil Devonshire M.D.   On: 11/12/2023 01:51   CT Head Wo Contrast Result Date: 11/12/2023 CLINICAL DATA:  Mental status change, unknown cause EXAM: CT HEAD WITHOUT CONTRAST TECHNIQUE: Contiguous axial images were obtained from the base of the skull through  the vertex without intravenous contrast. RADIATION DOSE REDUCTION: This exam was performed according to the departmental dose-optimization program which includes automated exposure control, adjustment of the mA and/or kV according to patient size and/or use of iterative reconstruction technique. COMPARISON:  CT head 06/17/2021 FINDINGS: Brain: Cerebral ventricle sizes are concordant with the degree of cerebral volume loss. Trace patchy and confluent areas of decreased attenuation are noted throughout the deep and periventricular white matter of the cerebral hemispheres bilaterally, compatible with chronic microvascular ischemic disease. No evidence of large-territorial acute infarction. No parenchymal hemorrhage. No mass lesion. No extra-axial collection. No mass effect or midline shift. No hydrocephalus. Basilar cisterns are patent. Empty sella. Vascular: No hyperdense vessel.  Atherosclerotic calcifications are present within the cavernous internal carotid and vertebral arteries. Skull: No acute fracture or focal lesion. Sinuses/Orbits: Paranasal sinuses and mastoid air cells are clear. The orbits are unremarkable. Other: None. IMPRESSION: 1. No acute intracranial abnormality. 2. Empty sella. Findings is often a normal anatomic variant but can be associated with idiopathic intracranial hypertension (pseudotumor cerebri). Electronically Signed   By: Morgane  Naveau M.D.   On: 11/12/2023 01:42   DG Chest 1 View Result Date: 11/11/2023 CLINICAL DATA:  Abdominal pain, nausea EXAM: CHEST  1 VIEW COMPARISON:  None Available. FINDINGS: Heart and mediastinal contours within normal limits. No confluent airspace opacities, effusions or edema. No acute bony abnormality. IMPRESSION: No active disease. Electronically Signed   By: Franky Crease M.D.   On: 11/11/2023 23:07    Labs: Basic Metabolic Panel: Recent Labs  Lab 11/12/23 1654 11/16/23 1354 11/17/23 0801 11/18/23 0434 11/19/23 0500  NA 136 133* 138 137  --   K 3.5 3.6 4.5 3.5  --   CL 107 103 107 107  --   CO2 22 20* 25 23  --   GLUCOSE 91 98 86 77  --   BUN 22 24* 23 23  --   CREATININE 0.92 1.58* 1.21 1.06 1.02  CALCIUM  7.9* 7.6* 8.2* 8.0*  --    CBC: Recent Labs  Lab 11/12/23 1654 11/16/23 1354 11/17/23 0801 11/18/23 0434  WBC 4.6 11.8* 6.5 4.3  HGB 8.2* 7.6* 8.8* 8.0*  HCT 24.9* 22.6* 26.7* 23.7*  MCV 89.9 88.3 87.8 86.5  PLT 75* 87* 101* 102*   Microbiology: Results for orders placed or performed during the hospital encounter of 11/11/23  Culture, blood (Routine X 2) w Reflex to ID Panel     Status: None (Preliminary result)   Collection Time: 11/16/23  5:37 PM   Specimen: BLOOD  Result Value Ref Range Status   Specimen Description BLOOD LEFT ANTECUBITAL  Final   Special Requests   Final    BOTTLES DRAWN AEROBIC AND ANAEROBIC Blood Culture adequate volume   Culture   Final    NO GROWTH 3  DAYS Performed at [facility], [address] [place], [place] [address]    Report Status PENDING  Incomplete  Culture, blood (Routine X 2) w Reflex to ID Panel     Status: None (Preliminary result)   Collection Time: 11/16/23  5:48 PM   Specimen: BLOOD  Result Value Ref Range Status   Specimen Description BLOOD BLOOD LEFT WRIST  Final   Special Requests   Final    BOTTLES DRAWN AEROBIC AND ANAEROBIC Blood Culture adequate volume   Culture   Final    NO GROWTH 3 DAYS Performed at [facility], [address] [place], [place] [address]    Report  Status PENDING  Incomplete    Time coordinating discharge: Over 30 minutes  Marien LITTIE Piety, MD  [facility] 11/19/2023, 12:20 PM

## 2023-11-21 LAB — CULTURE, BLOOD (ROUTINE X 2)
Culture: NO GROWTH
Culture: NO GROWTH
Special Requests: ADEQUATE
Special Requests: ADEQUATE

## 2023-12-15 ENCOUNTER — Encounter: Admitting: Nurse Practitioner

## 2024-01-08 ENCOUNTER — Telehealth: Payer: Self-pay

## 2024-01-08 NOTE — Telephone Encounter (Signed)
 Left message for the Patient to call the office back regarding his wound and scheduling an appointment to have it evaluated and wound care. Okay to get update on wound and location of the wound. Also, schedule a office visit for the wound per Chelsea Aurora.

## 2024-01-08 NOTE — Telephone Encounter (Signed)
 Copied from CRM [id number]. Topic: Clinical - Medical Advice >> Jan 08, 2024  8:52 AM Berneda FALCON wrote: Reason for CRM: Shona Maiden from [facility] would like PCP to know that the daughter has refused home health to come out for wound care for the patient-patient did not have wound care by home health last week and did not have it this week. Daughter states that she is just too busy and the nurse tried to see if the daughter knew how to do it and she said it was not her problem and was rude to them. Nurse is concerned about the wounds.  [phone]

## 2024-01-08 NOTE — Telephone Encounter (Signed)
 Please call patient to obtain an update on the location and status of the wound.  Schedule an acute office visit with any available provider to assess the wound.

## 2024-01-09 NOTE — Telephone Encounter (Signed)
 Noted! Thank you

## 2024-01-11 NOTE — Telephone Encounter (Signed)
 Left message on the Patient's cell phone & home phone to call the office and schedule a follow up with Charanpreet Kaur.

## 2024-01-11 NOTE — Telephone Encounter (Signed)
 Patient's daughter Nena called and wanted to know who told us  about the wound care and said she needs a few days to get stuff together then she will contact us  with an updated medication list. Nena states the Patient was in the hospital and they are having to get DME for the Patient such as a hospital bed & wheelchair. Nena states her cell phone needs to be the main phone number [phone].

## 2024-01-12 NOTE — Telephone Encounter (Signed)
 FYI

## 2024-01-22 NOTE — Telephone Encounter (Unsigned)
 Copied from CRM [id number]. Topic: General - Other >> Jan 22, 2024  9:26 AM Revonda D wrote: Reason for CRM: Pt's daughter is calling to get assistance with transportation for the pt's appt on 06/24/23 for TOC appt.

## 2024-01-28 ENCOUNTER — Telehealth: Payer: Self-pay

## 2024-01-28 NOTE — Telephone Encounter (Signed)
 Copied from CRM [id number]. Topic: General - Other >> Jan 28, 2024  2:18 PM Rosina BIRCH wrote: Reason for CRM: ariel from [facility] called stating the patient want to continue to have the same provider and he is being admitted to the services today [phone]

## 2024-01-28 NOTE — Telephone Encounter (Signed)
 Called Ariel from [facility] and let her know the Patient has not established care since his PCP Dr. Maribeth left. Also let Ariel know that the Patient's chart shows [facility] [place] as the PCP. Ariel stated she will discuss with her regional manager and let us  know something on 01/29/24.

## 2024-01-28 NOTE — Telephone Encounter (Signed)
 Pt has not been established yet  since Dr. Maribeth left.

## 2024-06-23 ENCOUNTER — Encounter: Admitting: Nurse Practitioner
